# Patient Record
Sex: Female | Born: 1940 | Race: White | Hispanic: No | Smoking: Never smoker
Health system: Southern US, Community
[De-identification: ages and names within clinical notes are randomized; demographics above are authoritative.]

## PROBLEM LIST (undated history)

## (undated) DIAGNOSIS — C4490 Unspecified malignant neoplasm of skin, unspecified: Secondary | ICD-10-CM

## (undated) DIAGNOSIS — I5189 Other ill-defined heart diseases: Secondary | ICD-10-CM

## (undated) DIAGNOSIS — Z860101 Personal history of adenomatous and serrated colon polyps: Secondary | ICD-10-CM

## (undated) DIAGNOSIS — E785 Hyperlipidemia, unspecified: Secondary | ICD-10-CM

## (undated) DIAGNOSIS — R519 Headache, unspecified: Secondary | ICD-10-CM

## (undated) DIAGNOSIS — I480 Paroxysmal atrial fibrillation: Secondary | ICD-10-CM

## (undated) DIAGNOSIS — N189 Chronic kidney disease, unspecified: Secondary | ICD-10-CM

## (undated) DIAGNOSIS — C189 Malignant neoplasm of colon, unspecified: Secondary | ICD-10-CM

## (undated) DIAGNOSIS — Z86010 Personal history of colonic polyps: Secondary | ICD-10-CM

## (undated) DIAGNOSIS — F32A Depression, unspecified: Secondary | ICD-10-CM

## (undated) DIAGNOSIS — I4891 Unspecified atrial fibrillation: Secondary | ICD-10-CM

## (undated) DIAGNOSIS — I10 Essential (primary) hypertension: Secondary | ICD-10-CM

## (undated) DIAGNOSIS — R55 Syncope and collapse: Secondary | ICD-10-CM

## (undated) DIAGNOSIS — N182 Chronic kidney disease, stage 2 (mild): Secondary | ICD-10-CM

## (undated) DIAGNOSIS — T8859XA Other complications of anesthesia, initial encounter: Secondary | ICD-10-CM

## (undated) DIAGNOSIS — R1310 Dysphagia, unspecified: Secondary | ICD-10-CM

## (undated) DIAGNOSIS — R51 Headache: Secondary | ICD-10-CM

## (undated) DIAGNOSIS — M1990 Unspecified osteoarthritis, unspecified site: Secondary | ICD-10-CM

## (undated) HISTORY — PX: ESOPHAGOGASTRODUODENOSCOPY: SHX1529

## (undated) HISTORY — PX: DILATION AND CURETTAGE OF UTERUS: SHX78

## (undated) HISTORY — PX: ABDOMINAL HYSTERECTOMY: SHX81

## (undated) HISTORY — PX: TONSILLECTOMY: SUR1361

## (undated) HISTORY — PX: COLONOSCOPY: SHX174

## (undated) HISTORY — PX: TUBAL LIGATION: SHX77

## (undated) HISTORY — PX: ANKLE FRACTURE SURGERY: SHX122

## (undated) HISTORY — PX: OTHER SURGICAL HISTORY: SHX169

---

## 2005-02-28 ENCOUNTER — Ambulatory Visit: Payer: Self-pay | Admitting: Unknown Physician Specialty

## 2006-03-13 ENCOUNTER — Ambulatory Visit: Payer: Self-pay | Admitting: Unknown Physician Specialty

## 2007-02-27 ENCOUNTER — Ambulatory Visit: Payer: Self-pay | Admitting: Unknown Physician Specialty

## 2008-03-02 ENCOUNTER — Ambulatory Visit: Payer: Self-pay | Admitting: Unknown Physician Specialty

## 2008-04-20 ENCOUNTER — Ambulatory Visit: Payer: Self-pay | Admitting: Unknown Physician Specialty

## 2009-03-04 ENCOUNTER — Ambulatory Visit: Payer: Self-pay | Admitting: Unknown Physician Specialty

## 2010-03-09 ENCOUNTER — Ambulatory Visit: Payer: Self-pay | Admitting: Unknown Physician Specialty

## 2011-05-09 ENCOUNTER — Ambulatory Visit: Payer: Self-pay | Admitting: Unknown Physician Specialty

## 2011-08-15 ENCOUNTER — Emergency Department: Payer: Self-pay | Admitting: Emergency Medicine

## 2011-08-18 ENCOUNTER — Emergency Department: Payer: Self-pay | Admitting: Internal Medicine

## 2011-08-22 ENCOUNTER — Emergency Department: Payer: Self-pay | Admitting: Emergency Medicine

## 2011-08-29 ENCOUNTER — Emergency Department: Payer: Self-pay | Admitting: Emergency Medicine

## 2012-05-16 ENCOUNTER — Ambulatory Visit: Payer: Self-pay | Admitting: Unknown Physician Specialty

## 2012-05-17 LAB — PATHOLOGY REPORT

## 2012-08-07 ENCOUNTER — Ambulatory Visit: Payer: Self-pay | Admitting: Internal Medicine

## 2013-08-27 ENCOUNTER — Ambulatory Visit: Payer: Self-pay | Admitting: Internal Medicine

## 2014-08-28 ENCOUNTER — Ambulatory Visit: Payer: Self-pay | Admitting: Internal Medicine

## 2015-05-17 ENCOUNTER — Other Ambulatory Visit: Payer: Self-pay | Admitting: Internal Medicine

## 2015-05-17 DIAGNOSIS — Z1231 Encounter for screening mammogram for malignant neoplasm of breast: Secondary | ICD-10-CM

## 2015-09-29 ENCOUNTER — Other Ambulatory Visit: Payer: Self-pay | Admitting: Internal Medicine

## 2015-09-29 DIAGNOSIS — Z1231 Encounter for screening mammogram for malignant neoplasm of breast: Secondary | ICD-10-CM

## 2015-10-08 ENCOUNTER — Ambulatory Visit
Admission: RE | Admit: 2015-10-08 | Discharge: 2015-10-08 | Disposition: A | Discharge status: Home / Self Care | Payer: Medicare Other | Source: Ambulatory Visit | Attending: Internal Medicine | Admitting: Internal Medicine

## 2015-10-08 ENCOUNTER — Other Ambulatory Visit: Payer: Self-pay | Admitting: Internal Medicine

## 2015-10-08 DIAGNOSIS — Z1231 Encounter for screening mammogram for malignant neoplasm of breast: Secondary | ICD-10-CM | POA: Diagnosis present

## 2015-10-08 HISTORY — DX: Unspecified malignant neoplasm of skin, unspecified: C44.90

## 2015-10-08 HISTORY — DX: Malignant neoplasm of colon, unspecified: C18.9

## 2016-12-11 ENCOUNTER — Other Ambulatory Visit: Payer: Self-pay | Admitting: Internal Medicine

## 2016-12-11 DIAGNOSIS — Z1231 Encounter for screening mammogram for malignant neoplasm of breast: Secondary | ICD-10-CM

## 2017-01-10 ENCOUNTER — Ambulatory Visit
Admission: RE | Admit: 2017-01-10 | Discharge: 2017-01-10 | Disposition: A | Discharge status: Home / Self Care | Payer: Medicare HMO | Source: Ambulatory Visit | Attending: Internal Medicine | Admitting: Internal Medicine

## 2017-01-10 DIAGNOSIS — Z1231 Encounter for screening mammogram for malignant neoplasm of breast: Secondary | ICD-10-CM

## 2017-08-31 ENCOUNTER — Encounter: Payer: Self-pay | Admitting: *Deleted

## 2017-09-03 ENCOUNTER — Ambulatory Visit
Admission: RE | Admit: 2017-09-03 | Discharge: 2017-09-03 | Disposition: A | Discharge status: Home / Self Care | Payer: Medicare HMO | Source: Ambulatory Visit | Attending: Unknown Physician Specialty | Admitting: Unknown Physician Specialty

## 2017-09-03 ENCOUNTER — Encounter
Admission: RE | Disposition: A | Discharge status: Home / Self Care | Payer: Self-pay | Source: Ambulatory Visit | Attending: Unknown Physician Specialty

## 2017-09-03 ENCOUNTER — Ambulatory Visit: Payer: Medicare HMO | Admitting: Anesthesiology

## 2017-09-03 ENCOUNTER — Encounter: Payer: Self-pay | Admitting: *Deleted

## 2017-09-03 DIAGNOSIS — Z7901 Long term (current) use of anticoagulants: Secondary | ICD-10-CM | POA: Diagnosis not present

## 2017-09-03 DIAGNOSIS — D122 Benign neoplasm of ascending colon: Secondary | ICD-10-CM | POA: Diagnosis not present

## 2017-09-03 DIAGNOSIS — D123 Benign neoplasm of transverse colon: Secondary | ICD-10-CM | POA: Insufficient documentation

## 2017-09-03 DIAGNOSIS — Z1211 Encounter for screening for malignant neoplasm of colon: Secondary | ICD-10-CM | POA: Insufficient documentation

## 2017-09-03 DIAGNOSIS — Z86010 Personal history of colonic polyps: Secondary | ICD-10-CM | POA: Insufficient documentation

## 2017-09-03 DIAGNOSIS — K5730 Diverticulosis of large intestine without perforation or abscess without bleeding: Secondary | ICD-10-CM | POA: Insufficient documentation

## 2017-09-03 DIAGNOSIS — I480 Paroxysmal atrial fibrillation: Secondary | ICD-10-CM | POA: Insufficient documentation

## 2017-09-03 DIAGNOSIS — K640 First degree hemorrhoids: Secondary | ICD-10-CM | POA: Diagnosis not present

## 2017-09-03 DIAGNOSIS — Z79899 Other long term (current) drug therapy: Secondary | ICD-10-CM | POA: Diagnosis not present

## 2017-09-03 DIAGNOSIS — M479 Spondylosis, unspecified: Secondary | ICD-10-CM | POA: Insufficient documentation

## 2017-09-03 DIAGNOSIS — I10 Essential (primary) hypertension: Secondary | ICD-10-CM | POA: Insufficient documentation

## 2017-09-03 DIAGNOSIS — E785 Hyperlipidemia, unspecified: Secondary | ICD-10-CM | POA: Diagnosis not present

## 2017-09-03 DIAGNOSIS — Z85038 Personal history of other malignant neoplasm of large intestine: Secondary | ICD-10-CM | POA: Diagnosis not present

## 2017-09-03 HISTORY — DX: Personal history of colonic polyps: Z86.010

## 2017-09-03 HISTORY — PX: COLONOSCOPY WITH PROPOFOL: SHX5780

## 2017-09-03 HISTORY — DX: Hyperlipidemia, unspecified: E78.5

## 2017-09-03 HISTORY — DX: Paroxysmal atrial fibrillation: I48.0

## 2017-09-03 HISTORY — DX: Essential (primary) hypertension: I10

## 2017-09-03 HISTORY — DX: Headache: R51

## 2017-09-03 HISTORY — DX: Headache, unspecified: R51.9

## 2017-09-03 HISTORY — DX: Dysphagia, unspecified: R13.10

## 2017-09-03 HISTORY — DX: Personal history of adenomatous and serrated colon polyps: Z86.0101

## 2017-09-03 HISTORY — DX: Unspecified osteoarthritis, unspecified site: M19.90

## 2017-09-03 SURGERY — COLONOSCOPY WITH PROPOFOL
Anesthesia: General

## 2017-09-03 MED ORDER — SODIUM CHLORIDE 0.9 % IV SOLN
INTRAVENOUS | Status: DC
  Administered 2017-09-03: 1000 mL via INTRAVENOUS

## 2017-09-03 MED ORDER — PROPOFOL 500 MG/50ML IV EMUL
INTRAVENOUS | Status: AC
  Filled 2017-09-03: qty 50

## 2017-09-03 MED ORDER — PROPOFOL 500 MG/50ML IV EMUL
INTRAVENOUS | Status: DC | PRN
  Administered 2017-09-03: 90 ug/kg/min via INTRAVENOUS

## 2017-09-03 MED ORDER — SODIUM CHLORIDE 0.9 % IV SOLN: INTRAVENOUS | Status: DC

## 2017-09-03 MED ORDER — PROPOFOL 10 MG/ML IV BOLUS
INTRAVENOUS | Status: DC | PRN
  Administered 2017-09-03: 80 mg via INTRAVENOUS

## 2017-09-03 MED ORDER — METOPROLOL TARTRATE 5 MG/5ML IV SOLN
INTRAVENOUS | Status: AC
  Filled 2017-09-03: qty 5

## 2017-09-03 NOTE — Anesthesia Preprocedure Evaluation (Signed)
Anesthesia Evaluation  Patient identified by MRN, date of birth, ID band Patient awake    Reviewed: Allergy & Precautions, NPO status , Patient's Chart, lab work & pertinent test results  History of Anesthesia Complications Negative for: history of anesthetic complications  Airway Mallampati: II  TM Distance: >3 FB Neck ROM: Full    Dental  (+) Implants   Pulmonary neg pulmonary ROS, neg sleep apnea, neg COPD,    breath sounds clear to auscultation- rhonchi (-) wheezing      Cardiovascular hypertension, Pt. on medications (-) CAD, (-) Past MI and (-) Cardiac Stents + dysrhythmias (paroxysmal afib) Atrial Fibrillation  Rhythm:Regular Rate:Normal - Systolic murmurs and - Diastolic murmurs    Neuro/Psych  Headaches, negative psych ROS   GI/Hepatic negative GI ROS, Neg liver ROS,   Endo/Other  negative endocrine ROSneg diabetes  Renal/GU negative Renal ROS     Musculoskeletal  (+) Arthritis ,   Abdominal (+) - obese,   Peds  Hematology negative hematology ROS (+)   Anesthesia Other Findings Past Medical History: No date: Arthritis     Comment:  lumbar spine No date: Colon cancer (HCC) No date: Dysphagia No date: Headache     Comment:  migraine No date: Hx of adenomatous colonic polyps No date: Hyperlipidemia No date: Hypertension No date: Paroxysmal atrial fibrillation (HCC) No date: Skin cancer   Reproductive/Obstetrics                             Anesthesia Physical Anesthesia Plan  ASA: II  Anesthesia Plan: General   Post-op Pain Management:    Induction: Intravenous  PONV Risk Score and Plan: 2 and Propofol infusion  Airway Management Planned: Natural Airway  Additional Equipment:   Intra-op Plan:   Post-operative Plan:   Informed Consent: I have reviewed the patients History and Physical, chart, labs and discussed the procedure including the risks, benefits and  alternatives for the proposed anesthesia with the patient or authorized representative who has indicated his/her understanding and acceptance.   Dental advisory given  Plan Discussed with: CRNA and Anesthesiologist  Anesthesia Plan Comments:         Anesthesia Quick Evaluation

## 2017-09-03 NOTE — Op Note (Signed)
[facility] Gastroenterology Patient Name: Caroline Zamora Procedure Date: 09/03/2017 12:06 PM MRN: [id number] Account #: [id number] Date of Birth: 07/09/1941 Admit Type: Outpatient Age: 76 Room: [facility] ENDO ROOM 3 Gender: Female Note Status: Finalized Procedure:            Colonoscopy Indications:          High risk colon cancer surveillance: Personal history                        of colonic polyps Providers:            Manya Silvas, MD Referring MD:         Ocie Cornfield. Ouida Sills MD, MD (Referring MD) Medicines:            Propofol per Anesthesia Complications:        No immediate complications. Procedure:            Pre-Anesthesia Assessment:                       - After reviewing the risks and benefits, the patient                        was deemed in satisfactory condition to undergo the                        procedure.                       After obtaining informed consent, the colonoscope was                        passed under direct vision. Throughout the procedure,                        the patient's blood pressure, pulse, and oxygen                        saturations were monitored continuously. The                        Colonoscope was introduced through the anus and                        advanced to the the cecum, identified by appendiceal                        orifice and ileocecal valve. The colonoscopy was                        somewhat difficult due to a tortuous colon. Successful                        completion of the procedure was aided by applying                        abdominal pressure. The patient tolerated the procedure                        well. The quality of the bowel preparation was  excellent. Findings:      A diminutive polyp was found in the proximal ascending colon. The polyp       was sessile. The polyp was removed with a jumbo cold forceps. Resection       and retrieval were complete.      A  small polyp was found in the ascending colon. The polyp was sessile.       The polyp was removed with a jumbo cold forceps. Resection and retrieval       were complete.      A diminutive polyp was found in the hepatic flexure. The polyp was       sessile. The polyp was removed with a jumbo cold forceps. Resection and       retrieval were complete.      Many medium-mouthed diverticula were found in the sigmoid colon and       descending colon.      Internal hemorrhoids were found during endoscopy. The hemorrhoids were       small and Grade I (internal hemorrhoids that do not prolapse). Impression:           - One diminutive polyp in the proximal ascending colon,                        removed with a jumbo cold forceps. Resected and                        retrieved.                       - One small polyp in the ascending colon, removed with                        a jumbo cold forceps. Resected and retrieved.                       - One diminutive polyp at the hepatic flexure, removed                        with a jumbo cold forceps. Resected and retrieved.                       - Diverticulosis in the sigmoid colon and in the                        descending colon.                       - Internal hemorrhoids. Recommendation:       - Await pathology results. Manya Silvas, MD 09/03/2017 12:56:53 PM This report has been signed electronically. Number of Addenda: 0 Note Initiated On: 09/03/2017 12:06 PM Scope Withdrawal Time: 0 hours 17 minutes 36 seconds  Total Procedure Duration: 0 hours 39 minutes 3 seconds       [facility]

## 2017-09-03 NOTE — Anesthesia Post-op Follow-up Note (Signed)
Anesthesia QCDR form completed.        

## 2017-09-03 NOTE — Anesthesia Postprocedure Evaluation (Signed)
Anesthesia Post Note  Patient: Caroline Zamora  Procedure(s) Performed: COLONOSCOPY WITH PROPOFOL (N/A )  Patient location during evaluation: Endoscopy Anesthesia Type: General Level of consciousness: awake and alert and oriented Pain management: pain level controlled Vital Signs Assessment: post-procedure vital signs reviewed and stable Respiratory status: spontaneous breathing, nonlabored ventilation and respiratory function stable Cardiovascular status: blood pressure returned to baseline and stable Postop Assessment: no signs of nausea or vomiting Anesthetic complications: no     Last Vitals:  Vitals:   09/03/17 1305 09/03/17 1315  BP: (!) 142/80 (!) 158/87  Pulse: (!) 56 (!) 57  Resp: 20 14  Temp:    SpO2: 97% 97%    Last Pain:  Vitals:   09/03/17 1255  TempSrc: Tympanic                 Danicia Terhaar

## 2017-09-03 NOTE — Transfer of Care (Signed)
Immediate Anesthesia Transfer of Care Note  Patient: Caroline Zamora  Procedure(s) Performed: COLONOSCOPY WITH PROPOFOL (N/A )  Patient Location: PACU and Endoscopy Unit  Anesthesia Type:General  Level of Consciousness: drowsy and patient cooperative  Airway & Oxygen Therapy: Patient Spontanous Breathing and Patient connected to nasal cannula oxygen  Post-op Assessment: Report given to RN and Post -op Vital signs reviewed and stable  Post vital signs: Reviewed and stable  Last Vitals:  Vitals:   09/03/17 1254 09/03/17 1255  BP: 122/64 122/64  Pulse: (!) 58 (!) 59  Resp: 16 15  Temp:  (!) 35.8 C  SpO2: 97% 97%    Last Pain:  Vitals:   09/03/17 1255  TempSrc: Tympanic         Complications: No apparent anesthesia complications

## 2017-09-03 NOTE — H&P (Signed)
Primary Care Physician:  Kirk Ruths, MD Primary Gastroenterologist:  Dr. Vira Agar  Pre-Procedure History & Physical: HPI:  Caroline Zamora is a 76 y.o. female is here for an colonoscopy.   Past Medical History:  Diagnosis Date  . Arthritis    lumbar spine  . Colon cancer ([place])   . Dysphagia   . Headache    migraine  . Hx of adenomatous colonic polyps   . Hyperlipidemia   . Hypertension   . Paroxysmal atrial fibrillation (HCC)   . Skin cancer     Past Surgical History:  Procedure Laterality Date  . ABDOMINAL HYSTERECTOMY     with removal of tubes and ovaries  . COLONOSCOPY    . DILATION AND CURETTAGE OF UTERUS    . ESOPHAGOGASTRODUODENOSCOPY    . skin cancer removal    . TONSILLECTOMY    . TUBAL LIGATION      Prior to Admission medications   Medication Sig Start Date End Date Taking? Authorizing Provider  apixaban (ELIQUIS) 5 MG TABS tablet Take 5 mg by mouth 2 (two) times daily.   Yes [provider]  calcium carbonate (TUMS - DOSED IN MG ELEMENTAL CALCIUM) 500 MG chewable tablet Chew 1 tablet by mouth daily.   Yes [provider]  carvedilol (COREG) 12.5 MG tablet Take 12.5 mg by mouth 2 (two) times daily with a meal.   Yes [provider]  estradiol (ESTRACE) 0.5 MG tablet Take 0.5 mg by mouth daily.   Yes [provider]  mometasone (NASONEX) 50 MCG/ACT nasal spray Place 2 sprays into the nose daily.   Yes [provider]  Multiple Vitamin (MULTIVITAMIN) tablet Take 1 tablet by mouth daily.   Yes [provider]    Allergies as of 07/06/2017  . (Not on File)    Family History  Problem Relation Age of Onset  . Breast cancer Neg Hx     Social History   Social History  . Marital status: Widowed    Spouse name: N/A  . Number of children: N/A  . Years of education: N/A   Occupational History  . Not on file.   Social History Main Topics  . Smoking status: Never Smoker  . Smokeless  tobacco: Never Used  . Alcohol use Yes  . Drug use: No  . Sexual activity: Not on file   Other Topics Concern  . Not on file   Social History Narrative  . No narrative on file    Review of Systems: See HPI, otherwise negative ROS  Physical Exam: BP (!) 160/77   Pulse 68   Temp (!) 96.8 F (36 C) (Tympanic)   Resp 20   Ht 5\' 6"  (1.676 m)   Wt 79.4 kg (175 lb)   SpO2 99%   BMI 28.25 kg/m  General:   Alert,  pleasant and cooperative in NAD Head:  Normocephalic and atraumatic. Neck:  Supple; no masses or thyromegaly. Lungs:  Clear throughout to auscultation.    Heart:  Regular rate and rhythm. Abdomen:  Soft, nontender and nondistended. Normal bowel sounds, without guarding, and without rebound.   Neurologic:  Alert and  oriented x4;  grossly normal neurologically.  Impression/Plan: Caroline Zamora is here for an colonoscopy to be performed for [facility] dysplastic polyp.  Risks, benefits, limitations, and alternatives regarding  colonoscopy have been reviewed with the patient.  Questions have been answered.  All parties agreeable.   Gaylyn Cheers, MD  09/03/2017, 12:07  PM

## 2017-09-04 ENCOUNTER — Encounter: Payer: Self-pay | Admitting: Unknown Physician Specialty

## 2017-09-04 LAB — SURGICAL PATHOLOGY

## 2018-01-25 ENCOUNTER — Other Ambulatory Visit: Payer: Self-pay | Admitting: Internal Medicine

## 2018-01-25 DIAGNOSIS — Z1231 Encounter for screening mammogram for malignant neoplasm of breast: Secondary | ICD-10-CM

## 2018-02-20 ENCOUNTER — Ambulatory Visit
Admission: RE | Admit: 2018-02-20 | Discharge: 2018-02-20 | Disposition: A | Discharge status: Home / Self Care | Payer: Medicare HMO | Source: Ambulatory Visit | Attending: Internal Medicine | Admitting: Internal Medicine

## 2018-02-20 DIAGNOSIS — Z1231 Encounter for screening mammogram for malignant neoplasm of breast: Secondary | ICD-10-CM | POA: Diagnosis not present

## 2019-03-24 ENCOUNTER — Other Ambulatory Visit: Payer: Self-pay | Admitting: Internal Medicine

## 2019-03-24 DIAGNOSIS — Z1231 Encounter for screening mammogram for malignant neoplasm of breast: Secondary | ICD-10-CM

## 2019-05-07 ENCOUNTER — Ambulatory Visit
Admission: RE | Admit: 2019-05-07 | Discharge: 2019-05-07 | Disposition: A | Discharge status: Home / Self Care | Payer: Medicare HMO | Source: Ambulatory Visit | Attending: Internal Medicine | Admitting: Internal Medicine

## 2019-05-07 ENCOUNTER — Other Ambulatory Visit: Payer: Self-pay

## 2019-05-07 DIAGNOSIS — Z1231 Encounter for screening mammogram for malignant neoplasm of breast: Secondary | ICD-10-CM | POA: Insufficient documentation

## 2019-07-04 ENCOUNTER — Other Ambulatory Visit: Payer: Self-pay

## 2019-07-04 ENCOUNTER — Inpatient Hospital Stay
Admission: EM | Admit: 2019-07-04 | Discharge: 2019-07-07 | DRG: 309 | Disposition: A | Discharge status: Home / Self Care | Payer: Medicare HMO | Attending: Internal Medicine | Admitting: Internal Medicine

## 2019-07-04 ENCOUNTER — Emergency Department: Payer: Medicare HMO

## 2019-07-04 DIAGNOSIS — Z85828 Personal history of other malignant neoplasm of skin: Secondary | ICD-10-CM | POA: Diagnosis not present

## 2019-07-04 DIAGNOSIS — R55 Syncope and collapse: Secondary | ICD-10-CM | POA: Diagnosis not present

## 2019-07-04 DIAGNOSIS — Z90722 Acquired absence of ovaries, bilateral: Secondary | ICD-10-CM | POA: Diagnosis not present

## 2019-07-04 DIAGNOSIS — R197 Diarrhea, unspecified: Secondary | ICD-10-CM | POA: Diagnosis present

## 2019-07-04 DIAGNOSIS — E876 Hypokalemia: Secondary | ICD-10-CM | POA: Diagnosis present

## 2019-07-04 DIAGNOSIS — H532 Diplopia: Secondary | ICD-10-CM | POA: Diagnosis present

## 2019-07-04 DIAGNOSIS — T426X5A Adverse effect of other antiepileptic and sedative-hypnotic drugs, initial encounter: Secondary | ICD-10-CM | POA: Diagnosis present

## 2019-07-04 DIAGNOSIS — Z85038 Personal history of other malignant neoplasm of large intestine: Secondary | ICD-10-CM

## 2019-07-04 DIAGNOSIS — I700 Atherosclerosis of aorta: Secondary | ICD-10-CM | POA: Diagnosis present

## 2019-07-04 DIAGNOSIS — I739 Peripheral vascular disease, unspecified: Secondary | ICD-10-CM | POA: Diagnosis present

## 2019-07-04 DIAGNOSIS — R7989 Other specified abnormal findings of blood chemistry: Secondary | ICD-10-CM | POA: Diagnosis present

## 2019-07-04 DIAGNOSIS — Z882 Allergy status to sulfonamides status: Secondary | ICD-10-CM

## 2019-07-04 DIAGNOSIS — Z79899 Other long term (current) drug therapy: Secondary | ICD-10-CM

## 2019-07-04 DIAGNOSIS — Z20828 Contact with and (suspected) exposure to other viral communicable diseases: Secondary | ICD-10-CM | POA: Diagnosis present

## 2019-07-04 DIAGNOSIS — I4891 Unspecified atrial fibrillation: Secondary | ICD-10-CM | POA: Diagnosis present

## 2019-07-04 DIAGNOSIS — W19XXXA Unspecified fall, initial encounter: Secondary | ICD-10-CM | POA: Diagnosis present

## 2019-07-04 DIAGNOSIS — F419 Anxiety disorder, unspecified: Secondary | ICD-10-CM | POA: Diagnosis present

## 2019-07-04 DIAGNOSIS — E860 Dehydration: Secondary | ICD-10-CM | POA: Diagnosis present

## 2019-07-04 DIAGNOSIS — Z86010 Personal history of colonic polyps: Secondary | ICD-10-CM

## 2019-07-04 DIAGNOSIS — Y92000 Kitchen of unspecified non-institutional (private) residence as  the place of occurrence of the external cause: Secondary | ICD-10-CM

## 2019-07-04 DIAGNOSIS — I10 Essential (primary) hypertension: Secondary | ICD-10-CM | POA: Diagnosis present

## 2019-07-04 DIAGNOSIS — Z90710 Acquired absence of both cervix and uterus: Secondary | ICD-10-CM | POA: Diagnosis not present

## 2019-07-04 DIAGNOSIS — E871 Hypo-osmolality and hyponatremia: Secondary | ICD-10-CM | POA: Diagnosis present

## 2019-07-04 DIAGNOSIS — M25511 Pain in right shoulder: Secondary | ICD-10-CM | POA: Diagnosis present

## 2019-07-04 DIAGNOSIS — Z7901 Long term (current) use of anticoagulants: Secondary | ICD-10-CM

## 2019-07-04 DIAGNOSIS — I480 Paroxysmal atrial fibrillation: Secondary | ICD-10-CM | POA: Diagnosis not present

## 2019-07-04 DIAGNOSIS — E782 Mixed hyperlipidemia: Secondary | ICD-10-CM | POA: Diagnosis present

## 2019-07-04 DIAGNOSIS — Z888 Allergy status to other drugs, medicaments and biological substances status: Secondary | ICD-10-CM | POA: Diagnosis not present

## 2019-07-04 LAB — COMPREHENSIVE METABOLIC PANEL
ALT: 19 U/L (ref 0–44)
AST: 23 U/L (ref 15–41)
Albumin: 4 g/dL (ref 3.5–5.0)
Alkaline Phosphatase: 39 U/L (ref 38–126)
Anion gap: 13 (ref 5–15)
BUN: 14 mg/dL (ref 8–23)
CO2: 21 mmol/L — ABNORMAL LOW (ref 22–32)
Calcium: 8.6 mg/dL — ABNORMAL LOW (ref 8.9–10.3)
Chloride: 88 mmol/L — ABNORMAL LOW (ref 98–111)
Creatinine, Ser: 0.93 mg/dL (ref 0.44–1.00)
GFR calc Af Amer: >60 mL/min (ref >60)
GFR calc non Af Amer: >60 mL/min (ref >60)
Glucose, Bld: 189 mg/dL — ABNORMAL HIGH (ref 70–99)
Potassium: 2.5 mmol/L — CL (ref 3.5–5.1)
Sodium: 122 mmol/L — ABNORMAL LOW (ref 135–145)
Total Bilirubin: 1 mg/dL (ref 0.3–1.2)
Total Protein: 7.3 g/dL (ref 6.5–8.1)

## 2019-07-04 LAB — URINALYSIS, COMPLETE (UACMP) WITH MICROSCOPIC
Bilirubin Urine: NEGATIVE
Glucose, UA: NEGATIVE mg/dL
Ketones, ur: 20 mg/dL — AB
Nitrite: NEGATIVE
Protein, ur: 30 mg/dL — AB
Specific Gravity, Urine: 1.018 (ref 1.005–1.030)
pH: 6 (ref 5.0–8.0)

## 2019-07-04 LAB — CBC
HCT: 44.8 % (ref 36.0–46.0)
Hemoglobin: 16.2 g/dL — ABNORMAL HIGH (ref 12.0–15.0)
MCH: 29.6 pg (ref 26.0–34.0)
MCHC: 36.2 g/dL — ABNORMAL HIGH (ref 30.0–36.0)
MCV: 81.9 fL (ref 80.0–100.0)
Platelets: 275 10*3/uL (ref 150–400)
RBC: 5.47 MIL/uL — ABNORMAL HIGH (ref 3.87–5.11)
RDW: 13.2 % (ref 11.5–15.5)
WBC: 10.5 10*3/uL (ref 4.0–10.5)
nRBC: 0 % (ref 0.0–0.2)

## 2019-07-04 LAB — TROPONIN I (HIGH SENSITIVITY)
Troponin I (High Sensitivity): 6 ng/L (ref <18)
Troponin I (High Sensitivity): 7 ng/L (ref <18)

## 2019-07-04 LAB — SARS CORONAVIRUS 2 BY RT PCR (HOSPITAL ORDER, PERFORMED IN ~~LOC~~ HOSPITAL LAB): SARS Coronavirus 2: NEGATIVE

## 2019-07-04 LAB — TSH: TSH: 5.115 u[IU]/mL — ABNORMAL HIGH (ref 0.350–4.500)

## 2019-07-04 LAB — MAGNESIUM: Magnesium: 2 mg/dL (ref 1.7–2.4)

## 2019-07-04 MED ORDER — POTASSIUM CHLORIDE 10 MEQ/100ML IV SOLN
10.0000 meq | INTRAVENOUS | Status: AC
  Administered 2019-07-04 (×2): 10 meq via INTRAVENOUS
  Filled 2019-07-04 (×2): qty 100

## 2019-07-04 MED ORDER — ONDANSETRON HCL 4 MG/2ML IJ SOLN: 4.0000 mg | Freq: Four times a day (QID) | INTRAMUSCULAR | Status: DC | PRN

## 2019-07-04 MED ORDER — POTASSIUM CHLORIDE IN NACL 40-0.9 MEQ/L-% IV SOLN
INTRAVENOUS | Status: DC
  Filled 2019-07-04: qty 1000

## 2019-07-04 MED ORDER — SODIUM CHLORIDE 0.9 % IV BOLUS
500.0000 mL | Freq: Once | INTRAVENOUS | Status: AC
  Administered 2019-07-04: 500 mL via INTRAVENOUS

## 2019-07-04 MED ORDER — SODIUM CHLORIDE 0.9% FLUSH
3.0000 mL | Freq: Two times a day (BID) | INTRAVENOUS | Status: DC
  Administered 2019-07-05 – 2019-07-06 (×4): 3 mL via INTRAVENOUS

## 2019-07-04 MED ORDER — GABAPENTIN 100 MG PO CAPS: 100.0000 mg | ORAL_CAPSULE | Freq: Three times a day (TID) | ORAL | Status: DC

## 2019-07-04 MED ORDER — LORAZEPAM 1 MG PO TABS
1.0000 mg | ORAL_TABLET | Freq: Four times a day (QID) | ORAL | Status: DC | PRN
  Administered 2019-07-05 – 2019-07-07 (×3): 1 mg via ORAL
  Filled 2019-07-04 (×3): qty 1

## 2019-07-04 MED ORDER — ACETAMINOPHEN 650 MG RE SUPP: 650.0000 mg | Freq: Four times a day (QID) | RECTAL | Status: DC | PRN

## 2019-07-04 MED ORDER — DILTIAZEM HCL 100 MG IV SOLR
5.0000 mg/h | INTRAVENOUS | Status: DC
  Administered 2019-07-04: 5 mg/h via INTRAVENOUS
  Filled 2019-07-04 (×2): qty 100

## 2019-07-04 MED ORDER — ONDANSETRON HCL 4 MG PO TABS: 4.0000 mg | ORAL_TABLET | Freq: Four times a day (QID) | ORAL | Status: DC | PRN

## 2019-07-04 MED ORDER — CARVEDILOL 12.5 MG PO TABS
12.5000 mg | ORAL_TABLET | Freq: Two times a day (BID) | ORAL | Status: DC
  Administered 2019-07-05 – 2019-07-07 (×5): 12.5 mg via ORAL
  Filled 2019-07-04 (×5): qty 1

## 2019-07-04 MED ORDER — LORAZEPAM 0.5 MG PO TABS
0.2500 mg | ORAL_TABLET | Freq: Once | ORAL | Status: AC
  Administered 2019-07-04: 0.25 mg via ORAL
  Filled 2019-07-04: qty 1

## 2019-07-04 MED ORDER — ACETAMINOPHEN 325 MG PO TABS: 650.0000 mg | ORAL_TABLET | Freq: Four times a day (QID) | ORAL | Status: DC | PRN

## 2019-07-04 MED ORDER — SODIUM CHLORIDE 0.9 % IV SOLN: 250.0000 mL | INTRAVENOUS | Status: DC | PRN

## 2019-07-04 MED ORDER — FLUTICASONE PROPIONATE 50 MCG/ACT NA SUSP
1.0000 | Freq: Every day | NASAL | Status: DC | PRN
  Filled 2019-07-04: qty 16

## 2019-07-04 MED ORDER — APIXABAN 5 MG PO TABS
5.0000 mg | ORAL_TABLET | Freq: Two times a day (BID) | ORAL | Status: DC
  Administered 2019-07-04 – 2019-07-07 (×6): 5 mg via ORAL
  Filled 2019-07-04 (×6): qty 1

## 2019-07-04 MED ORDER — POTASSIUM CHLORIDE 20 MEQ/15ML (10%) PO SOLN
40.0000 meq | Freq: Once | ORAL | Status: AC
  Administered 2019-07-04: 40 meq via ORAL
  Filled 2019-07-04: qty 30

## 2019-07-04 MED ORDER — ESTRADIOL 1 MG PO TABS
0.5000 mg | ORAL_TABLET | Freq: Every day | ORAL | Status: DC
  Administered 2019-07-05 – 2019-07-07 (×3): 0.5 mg via ORAL
  Filled 2019-07-04 (×3): qty 0.5

## 2019-07-04 MED ORDER — POTASSIUM CHLORIDE 10 MEQ/100ML IV SOLN
10.0000 meq | INTRAVENOUS | Status: AC
  Administered 2019-07-04: 10 meq via INTRAVENOUS
  Filled 2019-07-04 (×4): qty 100

## 2019-07-04 MED ORDER — ESCITALOPRAM OXALATE 10 MG PO TABS
10.0000 mg | ORAL_TABLET | Freq: Every day | ORAL | Status: DC
  Administered 2019-07-05 – 2019-07-07 (×3): 10 mg via ORAL
  Filled 2019-07-04 (×3): qty 1

## 2019-07-04 MED ORDER — SODIUM CHLORIDE 0.9% FLUSH
3.0000 mL | INTRAVENOUS | Status: DC | PRN
  Administered 2019-07-05: 21:00:00 3 mL via INTRAVENOUS
  Filled 2019-07-04: qty 3

## 2019-07-04 NOTE — ED Provider Notes (Signed)
Patient care assumed from Dr. Rip Harbour.  Briefly, 78 year old female here with A. fib RVR and multiple syncopal episodes.  Heart rate is improved when not moving.  Lab work shows significant hypokalemia.  She does appear dehydrated clinically as well with hemoconcentration.  Gentle fluids, potassium replaced both p.o. and IV.  Magnesium added on also ordered.  Will admit to the hospitalist team.   Duffy Bruce, MD 07/04/19 1734

## 2019-07-04 NOTE — ED Provider Notes (Signed)
[facility] Emergency Department Provider Note   ____________________________________________   First MD Initiated Contact with Patient 07/04/19 1417     (approximate)  I have reviewed the triage vital signs and the nursing notes.   HISTORY  Chief Complaint Near Syncope   HPI Caroline Zamora is a 78 y.o. female who started on gabapentin for anxiety 3 days ago since then she has been feeling worse has been feeling lightheaded.  Today she was very wobbly on her feet and then fell backwards twice hitting her head.  She takes Eliquis.  She does not have any headache or any other plain pain except for some mild pain in the right shoulder that has just started.  She is very anxious and asked for something for anxiety she cannot stand the gabapentin any longer.         Past Medical History:  Diagnosis Date  . Arthritis    lumbar spine  . Colon cancer ([place])   . Dysphagia   . Headache    migraine  . Hx of adenomatous colonic polyps   . Hyperlipidemia   . Hypertension   . Paroxysmal atrial fibrillation (HCC)   . Skin cancer     There are no active problems to display for this patient.   Past Surgical History:  Procedure Laterality Date  . ABDOMINAL HYSTERECTOMY     with removal of tubes and ovaries  . COLONOSCOPY    . COLONOSCOPY WITH PROPOFOL N/A 09/03/2017   Procedure: COLONOSCOPY WITH PROPOFOL;  Surgeon: Manya Silvas, MD;  [facility] ENDOSCOPY;  Service: Endoscopy;  Laterality: N/A;  . DILATION AND CURETTAGE OF UTERUS    . ESOPHAGOGASTRODUODENOSCOPY    . skin cancer removal    . TONSILLECTOMY    . TUBAL LIGATION      Prior to Admission medications   Medication Sig Start Date End Date Taking? Authorizing Provider  apixaban (ELIQUIS) 5 MG TABS tablet Take 5 mg by mouth 2 (two) times daily.    [provider]  calcium carbonate (TUMS - DOSED IN MG ELEMENTAL CALCIUM) 500 MG chewable tablet Chew 1 tablet by mouth daily.     [provider]  carvedilol (COREG) 12.5 MG tablet Take 12.5 mg by mouth 2 (two) times daily with a meal.    [provider]  estradiol (ESTRACE) 0.5 MG tablet Take 0.5 mg by mouth daily.    [provider]  mometasone (NASONEX) 50 MCG/ACT nasal spray Place 2 sprays into the nose daily.    [provider]  Multiple Vitamin (MULTIVITAMIN) tablet Take 1 tablet by mouth daily.    [provider]    Allergies Desyrel [trazodone] and Sulfa antibiotics  Family History  Problem Relation Age of Onset  . Breast cancer Neg Hx     Social History Social History   Tobacco Use  . Smoking status: Never Smoker  . Smokeless tobacco: Never Used  Substance Use Topics  . Alcohol use: Yes  . Drug use: No    Review of Systems  Constitutional: No fever/chills Eyes: No visual changes. ENT: No sore throat. Cardiovascular: Denies chest pain. Respiratory: Denies shortness of breath. Gastrointestinal: No abdominal pain.  No nausea, no vomiting.  No diarrhea.  No constipation. Genitourinary: Negative for dysuria. Musculoskeletal: Negative for back pain. Skin: Negative for rash. Neurological: Negative for headaches, focal weakness   ____________________________________________   PHYSICAL EXAM:  VITAL SIGNS: ED Triage Vitals  Enc Vitals Group  BP 07/04/19 1326 115/65     Pulse Rate 07/04/19 1326 (!) 115     Resp 07/04/19 1326 19     Temp 07/04/19 1326 98 F (36.7 C)     Temp Source 07/04/19 1326 Oral     SpO2 07/04/19 1326 97 %     Weight 07/04/19 1329 175 lb (79.4 kg)     Height 07/04/19 1329 5\' 6"  (1.676 m)     Head Circumference --      Peak Flow --      Pain Score 07/04/19 1329 0     Pain Loc --      Pain Edu? --      Excl. in [place]? --     Constitutional: Alert and oriented. Well appearing and in no acute distress. Eyes: Conjunctivae are normal. PERRL. EOMI. Head: Atraumatic. Nose: No congestion/rhinnorhea. Mouth/Throat: Mucous  membranes are moist.  Oropharynx non-erythematous. Neck: No stridor.  Cardiovascular: Rapid rate, irregular rhythm. Grossly normal heart sounds.  Good peripheral circulation. Respiratory: Normal respiratory effort.  No retractions. Lungs CTAB. Gastrointestinal: Soft and nontender. No distention. No abdominal bruits. No CVA tenderness. Musculoskeletal: No lower extremity tenderness nor edema. . Neurologic:  Normal speech and language. No gross focal neurologic deficits are appreciated. Skin:  Skin is warm, dry and intact. No rash noted.   ____________________________________________   LABS (all labs ordered are listed, but only abnormal results are displayed)  Labs Reviewed  CBC - Abnormal; Notable for the following components:      Result Value   RBC 5.47 (*)    Hemoglobin 16.2 (*)    MCHC 36.2 (*)    All other components within normal limits  COMPREHENSIVE METABOLIC PANEL - Abnormal; Notable for the following components:   Sodium 122 (*)    Potassium 2.5 (*)    Chloride 88 (*)    CO2 21 (*)    Glucose, Bld 189 (*)    Calcium 8.6 (*)    All other components within normal limits  URINALYSIS, COMPLETE (UACMP) WITH MICROSCOPIC  MAGNESIUM  CBG MONITORING, ED  TROPONIN I (HIGH SENSITIVITY)  TROPONIN I (HIGH SENSITIVITY)   ____________________________________________  EKG  No old EKGs are available.  This EKG read interpreted by me shows A. fib at a rate of 105 normal axis very irregular baseline suggestion of T wave irregularities diffusely. ____________________________________________  RADIOLOGY  ED MD interpretation: Chest x-ray read by radiology reviewed by me shows no acute disease Patient's head and neck CT read by radiology reviewed briefly by me show no acute disease Official radiology report(s): Ct Head Wo Contrast  Result Date: 07/04/2019 CLINICAL DATA:  Near syncopal episodes today, head trauma. EXAM: CT HEAD WITHOUT CONTRAST CT CERVICAL SPINE WITHOUT CONTRAST  TECHNIQUE: Multidetector CT imaging of the head and cervical spine was performed following the standard protocol without intravenous contrast. Multiplanar CT image reconstructions of the cervical spine were also generated. COMPARISON:  Report from MRI dated 01/05/1997 FINDINGS: CT HEAD FINDINGS Brain: The brainstem, cerebellum, cerebral peduncles, thalami, basal ganglia, basilar cisterns, and ventricular system appear within normal limits. No intracranial hemorrhage, mass lesion, or acute CVA. Vascular: There is atherosclerotic calcification of the cavernous carotid arteries bilaterally. Skull: Unremarkable Sinuses/Orbits: Incidental hypoplastic right sphenoid sinus. Otherwise unremarkable where included. Other: No supplemental non-categorized findings. CT CERVICAL SPINE FINDINGS Alignment: 2 mm degenerative anterolisthesis at C3-4. Skull base and vertebrae: No cervical spine fracture or acute bony finding is identified. Potential Schmorl's node along the superior endplate of T2. Soft  tissues and spinal canal: Unremarkable Disc levels: Uncinate and facet spurring cause osseous foraminal stenosis on the left at C3-4, C4-5, C6-7, and C7-T1; and on the right at C3-4, C5-6, and C6-7. Upper chest: Mild biapical pleuroparenchymal scarring Other: No supplemental non-categorized findings. IMPRESSION: 1. No acute intracranial findings and no acute cervical spine findings. 2. Multilevel cervical impingement due to spurring. 3. Atherosclerosis. 4. 2 mm of degenerative anterolisthesis at C3-4. No acute subluxation is identified. Electronically Signed   By: Van Clines M.D.   On: 07/04/2019 15:07   Ct Cervical Spine Wo Contrast  Result Date: 07/04/2019 CLINICAL DATA:  Near syncopal episodes today, head trauma. EXAM: CT HEAD WITHOUT CONTRAST CT CERVICAL SPINE WITHOUT CONTRAST TECHNIQUE: Multidetector CT imaging of the head and cervical spine was performed following the standard protocol without intravenous contrast.  Multiplanar CT image reconstructions of the cervical spine were also generated. COMPARISON:  Report from MRI dated 01/05/1997 FINDINGS: CT HEAD FINDINGS Brain: The brainstem, cerebellum, cerebral peduncles, thalami, basal ganglia, basilar cisterns, and ventricular system appear within normal limits. No intracranial hemorrhage, mass lesion, or acute CVA. Vascular: There is atherosclerotic calcification of the cavernous carotid arteries bilaterally. Skull: Unremarkable Sinuses/Orbits: Incidental hypoplastic right sphenoid sinus. Otherwise unremarkable where included. Other: No supplemental non-categorized findings. CT CERVICAL SPINE FINDINGS Alignment: 2 mm degenerative anterolisthesis at C3-4. Skull base and vertebrae: No cervical spine fracture or acute bony finding is identified. Potential Schmorl's node along the superior endplate of T2. Soft tissues and spinal canal: Unremarkable Disc levels: Uncinate and facet spurring cause osseous foraminal stenosis on the left at C3-4, C4-5, C6-7, and C7-T1; and on the right at C3-4, C5-6, and C6-7. Upper chest: Mild biapical pleuroparenchymal scarring Other: No supplemental non-categorized findings. IMPRESSION: 1. No acute intracranial findings and no acute cervical spine findings. 2. Multilevel cervical impingement due to spurring. 3. Atherosclerosis. 4. 2 mm of degenerative anterolisthesis at C3-4. No acute subluxation is identified. Electronically Signed   By: Van Clines M.D.   On: 07/04/2019 15:07   Dg Chest Portable 1 View  Result Date: 07/04/2019 CLINICAL DATA:  Near syncope EXAM: PORTABLE CHEST 1 VIEW COMPARISON:  None. Patient's prior chest x-ray is not available on PACs for comparison. FINDINGS: The heart size and mediastinal contours are within normal limits. Both lungs are clear. The visualized skeletal structures are unremarkable. IMPRESSION: No active disease. Electronically Signed   By: Abelardo Diesel M.D.   On: 07/04/2019 14:31     ____________________________________________   PROCEDURES  Procedure(s) performed (including Critical Care):  Procedures   ____________________________________________   INITIAL IMPRESSION / ASSESSMENT AND PLAN / ED COURSE     Patient with 2 episodes of what sounds like syncope.  She was standing get woozy does not remember falling but did remember hitting the ground both times.  Blood pressure was lower than usual when she got to the emergency room.  This may just be a medication reaction but I think in view of her age and being on Eliquis etc. she should be watched in the hospital overnight.          ____________________________________________   FINAL CLINICAL IMPRESSION(S) / ED DIAGNOSES  Final diagnoses:  Syncope and collapse     ED Discharge Orders    None       Note:  This document was prepared using Dragon voice recognition software and may include unintentional dictation errors.    Nena Polio, MD 07/04/19 [phone]

## 2019-07-04 NOTE — ED Triage Notes (Signed)
Pt stated she experienced two near syncopal episodes 45 minutes to an hour apart. Pt stated she called ems following 2nd episode. Pt started on Gabpentin 3 days ago.

## 2019-07-04 NOTE — Plan of Care (Signed)
  Problem: Education: Goal: Knowledge of General Education information will improve Description: Including pain rating scale, medication(s)/side effects and non-pharmacologic comfort measures Outcome: Progressing Note: Patient admitted at 2200. No complaints of pain. Patient profile completed. Heart rate is improving. Patient remains on the cardizem drip.

## 2019-07-04 NOTE — H&P (Signed)
[facility] [place] NAME: Caroline Zamora    MR#:  [id number]  DATE OF BIRTH:  1941-04-14  DATE OF ADMISSION:  07/04/2019  PRIMARY CARE PHYSICIAN: Kirk Ruths, MD   REQUESTING/REFERRING PHYSICIAN: Duffy Bruce, MD  CHIEF COMPLAINT:   Chief Complaint  Patient presents with  . Near Syncope    HISTORY OF PRESENT ILLNESS: Caroline Zamora  is a 78 y.o. female with a known history of atrial fibrillation, history of colon cancer, hypertension, hyperlipidemia, paroxysmal atrial fibrillation who presents to the hospital today with near syncope.  Patient states that over the past few weeks she has been very anxious and has not felt as well.  She also recently has had nausea vomiting and diarrhea.  No fevers or chills.  Earlier today she was very unsteady on her feet.  And also had episode where she is going to pass out.  Patient comes to the ER and is noted to have severe hypokalemia also noticed to have A. fib with RVR.  PAST MEDICAL HISTORY:   Past Medical History:  Diagnosis Date  . Arthritis    lumbar spine  . Colon cancer ([place])   . Dysphagia   . Headache    migraine  . Hx of adenomatous colonic polyps   . Hyperlipidemia   . Hypertension   . Paroxysmal atrial fibrillation (H[facility])   . Skin cancer     PAST SURGICAL HISTORY:  Past Surgical History:  Procedure Laterality Date  . ABDOMINAL HYSTERECTOMY     with removal of tubes and ovaries  . COLONOSCOPY    . COLONOSCOPY WITH PROPOFOL N/A 09/03/2017   Procedure: COLONOSCOPY WITH PROPOFOL;  Surgeon: Manya Silvas, MD;  Location: [facility] ENDOSCOPY;  Service: Endoscopy;  Laterality: N/A;  . DILATION AND CURETTAGE OF UTERUS    . ESOPHAGOGASTRODUODENOSCOPY    . skin cancer removal    . TONSILLECTOMY    . TUBAL LIGATION      SOCIAL HISTORY:  Social History   Tobacco Use  . Smoking status: Never Smoker  . Smokeless tobacco: Never Used  Substance Use Topics  . Alcohol use: Yes     FAMILY HISTORY:  Family History  Problem Relation Age of Onset  . Breast cancer Neg Hx     DRUG ALLERGIES:  Allergies  Allergen Reactions  . Desyrel [Trazodone] Rash  . Sulfa Antibiotics Rash    REVIEW OF SYSTEMS:   CONSTITUTIONAL: No fever, positive fatigue or positive weakness.  EYES: No blurred or double vision.  EARS, NOSE, AND THROAT: No tinnitus or ear pain.  RESPIRATORY: No cough, shortness of breath, wheezing or hemoptysis.  CARDIOVASCULAR: No chest pain, orthopnea, edema.  GASTROINTESTINAL: No nausea, vomiting, positive diarrhea or abdominal pain.  GENITOURINARY: No dysuria, hematuria.  ENDOCRINE: No polyuria, nocturia,  HEMATOLOGY: No anemia, easy bruising or bleeding SKIN: No rash or lesion. MUSCULOSKELETAL: No joint pain or arthritis.   NEUROLOGIC: No tingling, numbness, weakness.  PSYCHIATRY: No anxiety or depression.   MEDICATIONS AT HOME:  Prior to Admission medications   Medication Sig Start Date End Date Taking? Authorizing Provider  apixaban (ELIQUIS) 5 MG TABS tablet Take 5 mg by mouth 2 (two) times daily.   Yes [provider]  carvedilol (COREG) 12.5 MG tablet Take 12.5 mg by mouth 2 (two) times daily with a meal.   Yes [provider]  escitalopram (LEXAPRO) 10 MG tablet Take 10 mg by mouth daily. 07/01/19  Yes [provider]  estradiol (ESTRACE) 0.5 MG tablet Take 0.5 mg by mouth daily.   Yes [provider]  gabapentin (NEURONTIN) 100 MG capsule Take 100 mg by mouth 3 (three) times daily as needed. 07/02/19  Yes [provider]  losartan-hydrochlorothiazide (HYZAAR) 100-12.5 MG tablet Take 1 tablet by mouth daily. 04/14/19  Yes [provider]  mometasone (NASONEX) 50 MCG/ACT nasal spray Place 2 sprays into the nose daily as needed.    Yes [provider]      PHYSICAL EXAMINATION:   VITAL SIGNS: Blood pressure (!) 140/110, pulse 61, temperature 98 F (36.7 C), temperature source  Oral, resp. rate (!) 26, height 5\' 6"  (1.676 m), weight 79.4 kg, SpO2 98 %.  GENERAL:  78 y.o.-year-old patient lying in the bed with no acute distress.  EYES: Pupils equal, round, reactive to light and accommodation. No scleral icterus. Extraocular muscles intact.  HEENT: Head atraumatic, normocephalic. Oropharynx and nasopharynx clear.  NECK:  Supple, no jugular venous distention. No thyroid enlargement, no tenderness.  LUNGS: Normal breath sounds bilaterally, no wheezing, rales,rhonchi or crepitation. No use of accessory muscles of respiration.  CARDIOVASCULAR: Irregularly irregular no murmurs clicks or gallops.  ABDOMEN: Soft, nontender, nondistended. Bowel sounds present. No organomegaly or mass.  EXTREMITIES: No pedal edema, cyanosis, or clubbing.  NEUROLOGIC: Cranial nerves II through XII are intact. Muscle strength 5/5 in all extremities. Sensation intact. Gait not checked.  PSYCHIATRIC: The patient is alert and oriented x 3.  SKIN: No obvious rash, lesion, or ulcer.   LABORATORY PANEL:   CBC Recent Labs  Lab 07/04/19 1343  WBC 10.5  HGB 16.2*  HCT 44.8  PLT 275  MCV 81.9  MCH 29.6  MCHC 36.2*  RDW 13.2   ------------------------------------------------------------------------------------------------------------------  Chemistries  Recent Labs  Lab 07/04/19 1343  NA 122*  K 2.5*  CL 88*  CO2 21*  GLUCOSE 189*  BUN 14  CREATININE 0.93  CALCIUM 8.6*  AST 23  ALT 19  ALKPHOS 39  BILITOT 1.0   ------------------------------------------------------------------------------------------------------------------ estimated creatinine clearance is 55.5 mL/min (by C-G formula based on SCr of 0.93 mg/dL). ------------------------------------------------------------------------------------------------------------------ No results for input(s): TSH, T4TOTAL, T3FREE, THYROIDAB in the last 72 hours.  Invalid input(s): FREET3   Coagulation profile No results for  input(s): INR, PROTIME in the last 168 hours. ------------------------------------------------------------------------------------------------------------------- No results for input(s): DDIMER in the last 72 hours. -------------------------------------------------------------------------------------------------------------------  Cardiac Enzymes No results for input(s): CKMB, TROPONINI, MYOGLOBIN in the last 168 hours.  Invalid input(s): CK ------------------------------------------------------------------------------------------------------------------ Invalid input(s): POCBNP  ---------------------------------------------------------------------------------------------------------------  Urinalysis    Component Value Date/Time   COLORURINE YELLOW (A) 07/04/2019 1343   APPEARANCEUR HAZY (A) 07/04/2019 1343   LABSPEC 1.018 07/04/2019 1343   PHURINE 6.0 07/04/2019 1343   GLUCOSEU NEGATIVE 07/04/2019 1343   HGBUR MODERATE (A) 07/04/2019 1343   BILIRUBINUR NEGATIVE 07/04/2019 1343   KETONESUR 20 (A) 07/04/2019 1343   PROTEINUR 30 (A) 07/04/2019 1343   NITRITE NEGATIVE 07/04/2019 1343   LEUKOCYTESUR TRACE (A) 07/04/2019 1343     RADIOLOGY: Ct Head Wo Contrast  Result Date: 07/04/2019 CLINICAL DATA:  Near syncopal episodes today, head trauma. EXAM: CT HEAD WITHOUT CONTRAST CT CERVICAL SPINE WITHOUT CONTRAST TECHNIQUE: Multidetector CT imaging of the head and cervical spine was performed following the standard protocol without intravenous contrast. Multiplanar CT image reconstructions of the cervical spine were also generated. COMPARISON:  Report from MRI dated 01/05/1997 FINDINGS: CT HEAD FINDINGS Brain: The brainstem, cerebellum, cerebral peduncles, thalami, basal ganglia, basilar cisterns, and  ventricular system appear within normal limits. No intracranial hemorrhage, mass lesion, or acute CVA. Vascular: There is atherosclerotic calcification of the cavernous carotid arteries  bilaterally. Skull: Unremarkable Sinuses/Orbits: Incidental hypoplastic right sphenoid sinus. Otherwise unremarkable where included. Other: No supplemental non-categorized findings. CT CERVICAL SPINE FINDINGS Alignment: 2 mm degenerative anterolisthesis at C3-4. Skull base and vertebrae: No cervical spine fracture or acute bony finding is identified. Potential Schmorl's node along the superior endplate of T2. Soft tissues and spinal canal: Unremarkable Disc levels: Uncinate and facet spurring cause osseous foraminal stenosis on the left at C3-4, C4-5, C6-7, and C7-T1; and on the right at C3-4, C5-6, and C6-7. Upper chest: Mild biapical pleuroparenchymal scarring Other: No supplemental non-categorized findings. IMPRESSION: 1. No acute intracranial findings and no acute cervical spine findings. 2. Multilevel cervical impingement due to spurring. 3. Atherosclerosis. 4. 2 mm of degenerative anterolisthesis at C3-4. No acute subluxation is identified. Electronically Signed   By: Van Clines M.D.   On: 07/04/2019 15:07   Ct Cervical Spine Wo Contrast  Result Date: 07/04/2019 CLINICAL DATA:  Near syncopal episodes today, head trauma. EXAM: CT HEAD WITHOUT CONTRAST CT CERVICAL SPINE WITHOUT CONTRAST TECHNIQUE: Multidetector CT imaging of the head and cervical spine was performed following the standard protocol without intravenous contrast. Multiplanar CT image reconstructions of the cervical spine were also generated. COMPARISON:  Report from MRI dated 01/05/1997 FINDINGS: CT HEAD FINDINGS Brain: The brainstem, cerebellum, cerebral peduncles, thalami, basal ganglia, basilar cisterns, and ventricular system appear within normal limits. No intracranial hemorrhage, mass lesion, or acute CVA. Vascular: There is atherosclerotic calcification of the cavernous carotid arteries bilaterally. Skull: Unremarkable Sinuses/Orbits: Incidental hypoplastic right sphenoid sinus. Otherwise unremarkable where included. Other: No  supplemental non-categorized findings. CT CERVICAL SPINE FINDINGS Alignment: 2 mm degenerative anterolisthesis at C3-4. Skull base and vertebrae: No cervical spine fracture or acute bony finding is identified. Potential Schmorl's node along the superior endplate of T2. Soft tissues and spinal canal: Unremarkable Disc levels: Uncinate and facet spurring cause osseous foraminal stenosis on the left at C3-4, C4-5, C6-7, and C7-T1; and on the right at C3-4, C5-6, and C6-7. Upper chest: Mild biapical pleuroparenchymal scarring Other: No supplemental non-categorized findings. IMPRESSION: 1. No acute intracranial findings and no acute cervical spine findings. 2. Multilevel cervical impingement due to spurring. 3. Atherosclerosis. 4. 2 mm of degenerative anterolisthesis at C3-4. No acute subluxation is identified. Electronically Signed   By: Van Clines M.D.   On: 07/04/2019 15:07   Dg Chest Portable 1 View  Result Date: 07/04/2019 CLINICAL DATA:  Near syncope EXAM: PORTABLE CHEST 1 VIEW COMPARISON:  None. Patient's prior chest x-ray is not available on PACs for comparison. FINDINGS: The heart size and mediastinal contours are within normal limits. Both lungs are clear. The visualized skeletal structures are unremarkable. IMPRESSION: No active disease. Electronically Signed   By: Abelardo Diesel M.D.   On: 07/04/2019 14:31    EKG: Orders placed or performed during the hospital encounter of 07/04/19  . EKG 12-Lead  . EKG 12-Lead  . ED EKG  . ED EKG    IMPRESSION AND PLAN: Patient 78 year old with history of paroxysmal atrial fibrillation presenting with syncope  1.  A. fib with RVR we will start patient on IV Cardizem drip.  Continue her Coreg and Eliquis I will check echocardiogram of the heart as well as TSH We will replace her potassium  2.  Diarrhea currently better but if persists will need further stool study  3 severe hypokalemia monitor  on telemetry potassium is being replaced  4.   Anxiety will place her on Ativan as needed continue Lexapro  5.  Essential hypertension continue therapy with Coreg I will hold losartan HCTZ due to patient being on Cardizem drip  6.  Miscellaneous patient on Eliquis already  All the records are reviewed and case discussed with ED provider. Management plans discussed with the patient, family and they are in agreement.  CODE STATUS: Full code    TOTAL TIME TAKING CARE OF THIS PATIENT: 55 minutes.    Dustin Flock M.D on 07/04/2019 at 6:06 PM  Between 7am to 6pm - Pager - [phone]  After 6pm go to [email or web address] - password [email or web address]  [employer] Office  [phone]  CC: Primary care physician; Kirk Ruths, MD

## 2019-07-04 NOTE — ED Notes (Signed)
ED TO INPATIENT HANDOFF REPORT  ED Nurse Name and Phone #: Kambrea Carrasco 63  S Name/Age/Gender Caroline Zamora 78 y.o. female Room/Bed: ED03A/ED03A  Code Status   Code Status: Not on file  Home/SNF/Other Home Patient oriented to: self, place, time and situation Is this baseline? Yes   Triage Complete: Triage complete  Chief Complaint Fall   Triage Note Pt stated she experienced two near syncopal episodes 45 minutes to an hour apart. Pt stated she called ems following 2nd episode. Pt started on Gabpentin 3 days ago.    Allergies Allergies  Allergen Reactions  . Desyrel [Trazodone] Rash  . Sulfa Antibiotics Rash    Level of Care/Admitting Diagnosis ED Disposition    ED Disposition Condition [facility]: [place] [[id number]]  Level of Care: Telemetry [5]  Covid Evaluation: Asymptomatic Screening Protocol (No Symptoms)  Diagnosis: A-fib [facility] [[id number]]  Admitting Physician: Dustin Flock [[id number]]  Attending Physician: Dustin Flock [[id number]]  Estimated length of stay: past midnight tomorrow  Certification:: I certify this patient will need inpatient services for at least 2 midnights  PT Class (Do Not Modify): Inpatient [101]  PT Acc Code (Do Not Modify): Private [1]       B Medical/Surgery History Past Medical History:  Diagnosis Date  . Arthritis    lumbar spine  . Colon cancer ([place])   . Dysphagia   . Headache    migraine  . Hx of adenomatous colonic polyps   . Hyperlipidemia   . Hypertension   . Paroxysmal atrial fibrillation ([facility])   . Skin cancer    Past Surgical History:  Procedure Laterality Date  . ABDOMINAL HYSTERECTOMY     with removal of tubes and ovaries  . COLONOSCOPY    . COLONOSCOPY WITH PROPOFOL N/A 09/03/2017   Procedure: COLONOSCOPY WITH PROPOFOL;  Surgeon: Manya Silvas, MD;  Location: [facility] END[facility]COPY;  Service: Endoscopy;  Laterality: N/A;  . DILATION AND CURETTAGE OF UTERUS    .  ESOPHAGOGASTRODUODENOSCOPY    . skin cancer removal    . TONSILLECTOMY    . TUBAL LIGATION       A IV Location/Drains/Wounds Patient Lines/Drains/Airways Status   Active Line/Drains/Airways    Name:   Placement date:   Placement time:   Site:   Days:   Peripheral IV 07/04/19 Left Forearm   07/04/19    1239    Forearm   less than 1   Peripheral IV 07/04/19 Right Wrist   07/04/19    1857    Wrist   less than 1          Intake/Output Last 24 hours No intake or output data in the 24 hours ending 07/04/19 2027  Labs/Imaging Results for orders placed or performed during the hospital encounter of 07/04/19 (from the past 48 hour(s))  CBC     Status: Abnormal   Collection Time: 07/04/19  1:43 PM  Result Value Ref Range   WBC 10.5 4.0 - 10.5 K/uL   RBC 5.47 (H) 3.87 - 5.11 MIL/uL   Hemoglobin 16.2 (H) 12.0 - 15.0 g/dL   HCT 44.8 36.0 - 46.0 %   MCV 81.9 80.0 - 100.0 fL   MCH 29.6 26.0 - 34.0 pg   MCHC 36.2 (H) 30.0 - 36.0 g/dL   RDW 13.2 11.5 - 15.5 %   Platelets 275 150 - 400 K/uL   nRBC 0.0 0.0 - 0.2 %    Comment: Performed at [employer]  [facility], [address] [place], [place] [address]  Urinalysis, Complete w Microscopic     Status: Abnormal   Collection Time: 07/04/19  1:43 PM  Result Value Ref Range   Color, Urine YELLOW (A) YELLOW   APPearance HAZY (A) CLEAR   Specific Gravity, Urine 1.018 1.005 - 1.030   pH 6.0 5.0 - 8.0   Glucose, UA NEGATIVE NEGATIVE mg/dL   Hgb urine dipstick MODERATE (A) NEGATIVE   Bilirubin Urine NEGATIVE NEGATIVE   Ketones, ur 20 (A) NEGATIVE mg/dL   Protein, ur 30 (A) NEGATIVE mg/dL   Nitrite NEGATIVE NEGATIVE   Leukocytes,Ua TRACE (A) NEGATIVE   RBC / HPF 11-20 0 - 5 RBC/hpf   WBC, UA 0-5 0 - 5 WBC/hpf   Bacteria, UA RARE (A) NONE SEEN   Squamous Epithelial / LPF 0-5 0 - 5   Mucus PRESENT     Comment: Performed at [facility], [address] [place], [place] [address]  Comprehensive metabolic panel     Status: Abnormal    Collection Time: 07/04/19  1:43 PM  Result Value Ref Range   Sodium 122 (L) 135 - 145 mmol/L   Potassium 2.5 (LL) 3.5 - 5.1 mmol/L    Comment: CRITICAL RESULT CALLED TO, READ BACK BY AND VERIFIED WITH BILL SMITH AT 1440 07/04/2019.PMF   Chloride 88 (L) 98 - 111 mmol/L   CO2 21 (L) 22 - 32 mmol/L   Glucose, Bld 189 (H) 70 - 99 mg/dL   BUN 14 8 - 23 mg/dL   Creatinine, Ser 0.93 0.44 - 1.00 mg/dL   Calcium 8.6 (L) 8.9 - 10.3 mg/dL   Total Protein 7.3 6.5 - 8.1 g/dL   Albumin 4.0 3.5 - 5.0 g/dL   AST 23 15 - 41 U/L   ALT 19 0 - 44 U/L   Alkaline Phosphatase 39 38 - 126 U/L   Total Bilirubin 1.0 0.3 - 1.2 mg/dL   GFR calc non Af Amer >60 >60 mL/min   GFR calc Af Amer >60 >60 mL/min   Anion gap 13 5 - 15    Comment: Performed at [facility], [place], [place] [address]  Troponin I (High Sensitivity)     Status: None   Collection Time: 07/04/19  1:43 PM  Result Value Ref Range   Troponin I (High Sensitivity) 6 <18 ng/L    Comment: (NOTE) Elevated high sensitivity troponin I (hsTnI) values and significant  changes across serial measurements may suggest ACS but many other  chronic and acute conditions are known to elevate hsTnI results.  Refer to the "Links" section for chest pain algorithms and additional  guidance. Performed at [facility], [place], [place] [address]    Ct Head Wo Contrast  Result Date: 07/04/2019 CLINICAL DATA:  Near syncopal episodes today, head trauma. EXAM: CT HEAD WITHOUT CONTRAST CT CERVICAL SPINE WITHOUT CONTRAST TECHNIQUE: Multidetector CT imaging of the head and cervical spine was performed following the standard protocol without intravenous contrast. Multiplanar CT image reconstructions of the cervical spine were also generated. COMPARISON:  Report from MRI dated 01/05/1997 FINDINGS: CT HEAD FINDINGS Brain: The brainstem, cerebellum, cerebral peduncles, thalami, basal ganglia, basilar cisterns, and  ventricular system appear within normal limits. No intracranial hemorrhage, mass lesion, or acute CVA. Vascular: There is atherosclerotic calcification of the cavernous carotid arteries bilaterally. Skull: Unremarkable Sinuses/Orbits: Incidental hypoplastic right sphenoid sinus. Otherwise unremarkable where included. Other: No supplemental non-categorized findings. CT CERVICAL  SPINE FINDINGS Alignment: 2 mm degenerative anterolisthesis at C3-4. Skull base and vertebrae: No cervical spine fracture or acute bony finding is identified. Potential Schmorl's node along the superior endplate of T2. Soft tissues and spinal canal: Unremarkable Disc levels: Uncinate and facet spurring cause osseous foraminal stenosis on the left at C3-4, C4-5, C6-7, and C7-T1; and on the right at C3-4, C5-6, and C6-7. Upper chest: Mild biapical pleuroparenchymal scarring Other: No supplemental non-categorized findings. IMPRESSION: 1. No acute intracranial findings and no acute cervical spine findings. 2. Multilevel cervical impingement due to spurring. 3. Atherosclerosis. 4. 2 mm of degenerative anterolisthesis at C3-4. No acute subluxation is identified. Electronically Signed   By: Van Clines M.D.   On: 07/04/2019 15:07   Ct Cervical Spine Wo Contrast  Result Date: 07/04/2019 CLINICAL DATA:  Near syncopal episodes today, head trauma. EXAM: CT HEAD WITHOUT CONTRAST CT CERVICAL SPINE WITHOUT CONTRAST TECHNIQUE: Multidetector CT imaging of the head and cervical spine was performed following the standard protocol without intravenous contrast. Multiplanar CT image reconstructions of the cervical spine were also generated. COMPARISON:  Report from MRI dated 01/05/1997 FINDINGS: CT HEAD FINDINGS Brain: The brainstem, cerebellum, cerebral peduncles, thalami, basal ganglia, basilar cisterns, and ventricular system appear within normal limits. No intracranial hemorrhage, mass lesion, or acute CVA. Vascular: There is atherosclerotic  calcification of the cavernous carotid arteries bilaterally. Skull: Unremarkable Sinuses/Orbits: Incidental hypoplastic right sphenoid sinus. Otherwise unremarkable where included. Other: No supplemental non-categorized findings. CT CERVICAL SPINE FINDINGS Alignment: 2 mm degenerative anterolisthesis at C3-4. Skull base and vertebrae: No cervical spine fracture or acute bony finding is identified. Potential Schmorl's node along the superior endplate of T2. Soft tissues and spinal canal: Unremarkable Disc levels: Uncinate and facet spurring cause osseous foraminal stenosis on the left at C3-4, C4-5, C6-7, and C7-T1; and on the right at C3-4, C5-6, and C6-7. Upper chest: Mild biapical pleuroparenchymal scarring Other: No supplemental non-categorized findings. IMPRESSION: 1. No acute intracranial findings and no acute cervical spine findings. 2. Multilevel cervical impingement due to spurring. 3. Atherosclerosis. 4. 2 mm of degenerative anterolisthesis at C3-4. No acute subluxation is identified. Electronically Signed   By: Van Clines M.D.   On: 07/04/2019 15:07   Dg Chest Portable 1 View  Result Date: 07/04/2019 CLINICAL DATA:  Near syncope EXAM: PORTABLE CHEST 1 VIEW COMPARISON:  None. Patient's prior chest x-ray is not available on PACs for comparison. FINDINGS: The heart size and mediastinal contours are within normal limits. Both lungs are clear. The visualized skeletal structures are unremarkable. IMPRESSION: No active disease. Electronically Signed   By: Abelardo Diesel M.D.   On: 07/04/2019 14:31    Pending Labs Unresulted Labs (From admission, onward)    Start     Ordered   07/04/19 1825  SARS Coronavirus 2 [facility] order, Performed in [facility] hospital lab) Nasopharyngeal Nasopharyngeal Swab  (Symptomatic/High Risk of Exposure Patients Labs with Precautions)  Once,   STAT    Question Answer Comment  Is this test for diagnosis or screening Diagnosis of ill patient   Symptomatic for  COVID-19 as defined by CDC Yes   Date of Symptom Onset 07/02/2019   Hospitalized for COVID-19 Yes   Admitted to ICU for COVID-19 No   Previously tested for COVID-19 No   Resident in a congregate (group) care setting No   Employed in healthcare setting No   Pregnant No      07/04/19 1824   07/04/19 1555  Magnesium  Add-on,   AD  07/04/19 1554   Signed and Held  TSH  Once,   R     Signed and Held   Signed and Held  CBC  Tomorrow morning,   R     Signed and Held   Signed and Held  Basic metabolic panel  Tomorrow morning,   R     Signed and Held          Vitals/Pain Today's Vitals   07/04/19 1800 07/04/19 1930 07/04/19 2000 07/04/19 2026  BP: (!) 140/110 117/86 134/85 134/85  Pulse: 61 (!) 114 (!) 105 95  Resp: (!) 26 16 20 19   Temp:      TempSrc:      SpO2: 98% 94% 98% 98%  Weight:      Height:      PainSc:    0-No pain    Isolation Precautions Airborne and Contact precautions  Medications Medications  potassium chloride 10 mEq in 100 mL IVPB (10 mEq Intravenous New Bag/Given 07/04/19 1834)  0.9 % NaCl with KCl 40 mEq / L  infusion (has no administration in time range)  diltiazem (CARDIZEM) 100 mg in dextrose 5 % 100 mL (1 mg/mL) infusion (5 mg/hr Intravenous New Bag/Given 07/04/19 1948)  LORazepam (ATIVAN) tablet 1 mg (has no administration in time range)  potassium chloride 20 MEQ/15ML (10%) solution 40 mEq (40 mEq Oral Given 07/04/19 1538)  sodium chloride 0.9 % bolus 500 mL (500 mLs Intravenous New Bag/Given 07/04/19 1728)  LORazepam (ATIVAN) tablet 0.25 mg (0.25 mg Oral Given 07/04/19 1722)    Mobility Risk  Moderate fall risk   Focused Assessments Cardiac Assessment Handoff:    No results found for: CKTOTAL, CKMB, CKMBINDEX, TROPONINI No results found for: DDIMER Does the Patient currently have chest pain? No      R Recommendations: See Admitting Provider Note  Report given to:   Additional Notes:

## 2019-07-05 ENCOUNTER — Inpatient Hospital Stay
Admit: 2019-07-05 | Discharge: 2019-07-05 | Disposition: A | Discharge status: Home / Self Care | Payer: Medicare HMO | Attending: Internal Medicine | Admitting: Internal Medicine

## 2019-07-05 LAB — BASIC METABOLIC PANEL
Anion gap: 9 (ref 5–15)
BUN: 14 mg/dL (ref 8–23)
CO2: 24 mmol/L (ref 22–32)
Calcium: 7.5 mg/dL — ABNORMAL LOW (ref 8.9–10.3)
Chloride: 94 mmol/L — ABNORMAL LOW (ref 98–111)
Creatinine, Ser: 0.83 mg/dL (ref 0.44–1.00)
GFR calc Af Amer: >60 mL/min (ref >60)
GFR calc non Af Amer: >60 mL/min (ref >60)
Glucose, Bld: 109 mg/dL — ABNORMAL HIGH (ref 70–99)
Potassium: 2.9 mmol/L — ABNORMAL LOW (ref 3.5–5.1)
Sodium: 127 mmol/L — ABNORMAL LOW (ref 135–145)

## 2019-07-05 LAB — CBC
HCT: 40.4 % (ref 36.0–46.0)
Hemoglobin: 14.3 g/dL (ref 12.0–15.0)
MCH: 30.2 pg (ref 26.0–34.0)
MCHC: 35.4 g/dL (ref 30.0–36.0)
MCV: 85.4 fL (ref 80.0–100.0)
Platelets: 218 10*3/uL (ref 150–400)
RBC: 4.73 MIL/uL (ref 3.87–5.11)
RDW: 13.4 % (ref 11.5–15.5)
WBC: 11.1 10*3/uL — ABNORMAL HIGH (ref 4.0–10.5)
nRBC: 0 % (ref 0.0–0.2)

## 2019-07-05 LAB — C DIFFICILE QUICK SCREEN W PCR REFLEX
C Diff antigen: NEGATIVE
C Diff interpretation: NOT DETECTED
C Diff toxin: NEGATIVE

## 2019-07-05 MED ORDER — POTASSIUM CHLORIDE 20 MEQ/15ML (10%) PO SOLN
40.0000 meq | ORAL | Status: AC
  Administered 2019-07-05 (×2): 40 meq via ORAL
  Filled 2019-07-05 (×2): qty 30

## 2019-07-05 MED ORDER — DILTIAZEM HCL ER COATED BEADS 120 MG PO CP24
240.0000 mg | ORAL_CAPSULE | Freq: Every day | ORAL | Status: DC
  Administered 2019-07-05 – 2019-07-07 (×3): 240 mg via ORAL
  Filled 2019-07-05 (×3): qty 2

## 2019-07-05 MED ORDER — POTASSIUM CHLORIDE 20 MEQ/15ML (10%) PO SOLN
40.0000 meq | ORAL | Status: DC
  Filled 2019-07-05 (×2): qty 30

## 2019-07-05 MED ORDER — SODIUM CHLORIDE 0.9 % IV SOLN
INTRAVENOUS | Status: AC
  Administered 2019-07-05: 10:00:00 via INTRAVENOUS

## 2019-07-05 NOTE — Consult Note (Signed)
[facility] Consultation Note  Patient ID: Caroline Zamora, MRN: [id number], DOB/AGE: Jul 24, 1941 78 y.o. Admit date: 07/04/2019   Date of Consult: 07/05/2019 Primary Physician: Kirk Ruths, MD Primary Cardiologist: Paraschos  Chief Complaint:  Chief Complaint  Patient presents with  . Near Syncope   Reason for Consult: Atrial fibrillation  HPI: 78 y.o. female with paroxysmal nonvalvular atrial fibrillation essential hypertension mixed hyperlipidemia and peripheral vascular disease with aortic atherosclerosis who has had some anxiety and palpitations in recent months.  In the last few weeks she has had more more shortness of breath and palpitations.  The patient came to the emergency room because of concerns of palpitations and presyncope and syncope.  The patient was in her kitchen when she felt dizzy and then fell onto the floor she did not hurt her self.  The patient then tried to get up and move around but still was having some difficulty.  When seen in the emergency room she had an EKG showing atrial fibrillation with rapid ventricular rate and a chest x-ray which was normal and a CT of the head and spine which was showing no evidence of acute injury.  The patient has a troponin of 7 consistent with demand ischemia rather than acute coronary syndrome.  Currently she is on a diltiazem drip with improved heart rate control in addition to her other medications and hemodynamically stable without evidence of significant symptoms today  Past Medical History:  Diagnosis Date  . Arthritis    lumbar spine  . Colon cancer ([place])   . Dysphagia   . Headache    migraine  . Hx of adenomatous colonic polyps   . Hyperlipidemia   . Hypertension   . Paroxysmal atrial fibrillation (HCC)   . Skin cancer       Surgical History:  Past Surgical History:  Procedure Laterality Date  . ABDOMINAL HYSTERECTOMY     with removal of tubes and ovaries  . COLONOSCOPY    . COLONOSCOPY  WITH PROPOFOL N/A 09/03/2017   Procedure: COLONOSCOPY WITH PROPOFOL;  Surgeon: Manya Silvas, MD;  Location: [facility] ENDOSCOPY;  Service: Endoscopy;  Laterality: N/A;  . DILATION AND CURETTAGE OF UTERUS    . ESOPHAGOGASTRODUODENOSCOPY    . skin cancer removal    . TONSILLECTOMY    . TUBAL LIGATION       Home Meds: Prior to Admission medications   Medication Sig Start Date End Date Taking? Authorizing Provider  apixaban (ELIQUIS) 5 MG TABS tablet Take 5 mg by mouth 2 (two) times daily.   Yes [provider]  carvedilol (COREG) 12.5 MG tablet Take 12.5 mg by mouth 2 (two) times daily with a meal.   Yes [provider]  escitalopram (LEXAPRO) 10 MG tablet Take 10 mg by mouth daily. 07/01/19  Yes [provider]  estradiol (ESTRACE) 0.5 MG tablet Take 0.5 mg by mouth daily.   Yes [provider]  gabapentin (NEURONTIN) 100 MG capsule Take 100 mg by mouth 3 (three) times daily as needed. 07/02/19  Yes [provider]  losartan-hydrochlorothiazide (HYZAAR) 100-12.5 MG tablet Take 1 tablet by mouth daily. 04/14/19  Yes [provider]  mometasone (NASONEX) 50 MCG/ACT nasal spray Place 2 sprays into the nose daily as needed.    Yes [provider]    Inpatient Medications:  . apixaban  5 mg Oral BID  . carvedilol  12.5 mg Oral BID WC  . diltiazem  240 mg Oral Daily  .  escitalopram  10 mg Oral Daily  . estradiol  0.5 mg Oral Daily  . gabapentin  100 mg Oral TID  . sodium chloride flush  3 mL Intravenous Q12H   . sodium chloride      Allergies:  Allergies  Allergen Reactions  . Desyrel [Trazodone] Rash  . Sulfa Antibiotics Rash    Social History   Socioeconomic History  . Marital status: Widowed    Spouse name: Not on file  . Number of children: Not on file  . Years of education: Not on file  . Highest education level: Not on file  Occupational History  . Not on file  Social Needs  . Financial resource strain: Not on  file  . Food insecurity    Worry: Not on file    Inability: Not on file  . Transportation needs    Medical: Not on file    Non-medical: Not on file  Tobacco Use  . Smoking status: Never Smoker  . Smokeless tobacco: Never Used  Substance and Sexual Activity  . Alcohol use: Yes  . Drug use: No  . Sexual activity: Not on file  Lifestyle  . Physical activity    Days per week: Not on file    Minutes per session: Not on file  . Stress: Not on file  Relationships  . Social Herbalist on phone: Not on file    Gets together: Not on file    Attends religious service: Not on file    Active member of club or organization: Not on file    Attends meetings of clubs or organizations: Not on file    Relationship status: Not on file  . Intimate partner violence    Fear of current or ex partner: Not on file    Emotionally abused: Not on file    Physically abused: Not on file    Forced sexual activity: Not on file  Other Topics Concern  . Not on file  Social History Narrative  . Not on file     Family History  Problem Relation Age of Onset  . Breast cancer Neg Hx      Review of Systems Positive for palpitations presyncope syncope dizziness Negative for: General:  chills, fever, night sweats or weight changes.  Cardiovascular: PND orthopnea positive for syncope dizziness  Dermatological skin lesions rashes Respiratory: Cough congestion Urologic: Frequent urination urination at night and hematuria Abdominal: negative for nausea, vomiting, diarrhea, bright red blood per rectum, melena, or hematemesis Neurologic: negative for visual changes, and/or hearing changes  All other systems reviewed and are otherwise negative except as noted above.  Labs: No results for input(s): CKTOTAL, CKMB, TROPONINI in the last 72 hours. Lab Results  Component Value Date   WBC 11.1 (H) 07/05/2019   HGB 14.3 07/05/2019   HCT 40.4 07/05/2019   MCV 85.4 07/05/2019   PLT 218 07/05/2019     Recent Labs  Lab 07/04/19 1343 07/05/19 0418  NA 122* 127*  K 2.5* 2.9*  CL 88* 94*  CO2 21* 24  BUN 14 14  CREATININE 0.93 0.83  CALCIUM 8.6* 7.5*  PROT 7.3  --   BILITOT 1.0  --   ALKPHOS 39  --   ALT 19  --   AST 23  --   GLUCOSE 189* 109*   No results found for: CHOL, HDL, LDLCALC, TRIG No results found for: DDIMER  Radiology/Studies:  Ct Head Wo Contrast  Result Date: 07/04/2019 CLINICAL  DATA:  Near syncopal episodes today, head trauma. EXAM: CT HEAD WITHOUT CONTRAST CT CERVICAL SPINE WITHOUT CONTRAST TECHNIQUE: Multidetector CT imaging of the head and cervical spine was performed following the standard protocol without intravenous contrast. Multiplanar CT image reconstructions of the cervical spine were also generated. COMPARISON:  Report from MRI dated 01/05/1997 FINDINGS: CT HEAD FINDINGS Brain: The brainstem, cerebellum, cerebral peduncles, thalami, basal ganglia, basilar cisterns, and ventricular system appear within normal limits. No intracranial hemorrhage, mass lesion, or acute CVA. Vascular: There is atherosclerotic calcification of the cavernous carotid arteries bilaterally. Skull: Unremarkable Sinuses/Orbits: Incidental hypoplastic right sphenoid sinus. Otherwise unremarkable where included. Other: No supplemental non-categorized findings. CT CERVICAL SPINE FINDINGS Alignment: 2 mm degenerative anterolisthesis at C3-4. Skull base and vertebrae: No cervical spine fracture or acute bony finding is identified. Potential Schmorl's node along the superior endplate of T2. Soft tissues and spinal canal: Unremarkable Disc levels: Uncinate and facet spurring cause osseous foraminal stenosis on the left at C3-4, C4-5, C6-7, and C7-T1; and on the right at C3-4, C5-6, and C6-7. Upper chest: Mild biapical pleuroparenchymal scarring Other: No supplemental non-categorized findings. IMPRESSION: 1. No acute intracranial findings and no acute cervical spine findings. 2. Multilevel cervical  impingement due to spurring. 3. Atherosclerosis. 4. 2 mm of degenerative anterolisthesis at C3-4. No acute subluxation is identified. Electronically Signed   By: Van Clines M.D.   On: 07/04/2019 15:07   Ct Cervical Spine Wo Contrast  Result Date: 07/04/2019 CLINICAL DATA:  Near syncopal episodes today, head trauma. EXAM: CT HEAD WITHOUT CONTRAST CT CERVICAL SPINE WITHOUT CONTRAST TECHNIQUE: Multidetector CT imaging of the head and cervical spine was performed following the standard protocol without intravenous contrast. Multiplanar CT image reconstructions of the cervical spine were also generated. COMPARISON:  Report from MRI dated 01/05/1997 FINDINGS: CT HEAD FINDINGS Brain: The brainstem, cerebellum, cerebral peduncles, thalami, basal ganglia, basilar cisterns, and ventricular system appear within normal limits. No intracranial hemorrhage, mass lesion, or acute CVA. Vascular: There is atherosclerotic calcification of the cavernous carotid arteries bilaterally. Skull: Unremarkable Sinuses/Orbits: Incidental hypoplastic right sphenoid sinus. Otherwise unremarkable where included. Other: No supplemental non-categorized findings. CT CERVICAL SPINE FINDINGS Alignment: 2 mm degenerative anterolisthesis at C3-4. Skull base and vertebrae: No cervical spine fracture or acute bony finding is identified. Potential Schmorl's node along the superior endplate of T2. Soft tissues and spinal canal: Unremarkable Disc levels: Uncinate and facet spurring cause osseous foraminal stenosis on the left at C3-4, C4-5, C6-7, and C7-T1; and on the right at C3-4, C5-6, and C6-7. Upper chest: Mild biapical pleuroparenchymal scarring Other: No supplemental non-categorized findings. IMPRESSION: 1. No acute intracranial findings and no acute cervical spine findings. 2. Multilevel cervical impingement due to spurring. 3. Atherosclerosis. 4. 2 mm of degenerative anterolisthesis at C3-4. No acute subluxation is identified.  Electronically Signed   By: Van Clines M.D.   On: 07/04/2019 15:07   Dg Chest Portable 1 View  Result Date: 07/04/2019 CLINICAL DATA:  Near syncope EXAM: PORTABLE CHEST 1 VIEW COMPARISON:  None. Patient's prior chest x-ray is not available on PACs for comparison. FINDINGS: The heart size and mediastinal contours are within normal limits. Both lungs are clear. The visualized skeletal structures are unremarkable. IMPRESSION: No active disease. Electronically Signed   By: Abelardo Diesel M.D.   On: 07/04/2019 14:31    EKG: Atrial fibrillation with rapid ventricular rate with right bundle branch block and nonspecific ST changes and PVCs  Weights: Filed Weights   07/04/19 1329 07/04/19 2131 07/05/19  0510  Weight: 79.4 kg 79.7 kg 79.6 kg     Physical Exam: Blood pressure 106/64, pulse 94, temperature 98 F (36.7 C), temperature source Oral, resp. rate 18, height 5\' 6"  (1.676 m), weight 79.6 kg, SpO2 99 %. Body mass index is 28.31 kg/m. General: Well developed, well nourished, in no acute distress. Head eyes ears nose throat: Normocephalic, atraumatic, sclera non-icteric, no xanthomas, nares are without discharge. No apparent thyromegaly and/or mass  Lungs: Normal respiratory effort.  no wheezes, no rales, no rhonchi.  Heart: RRR with normal S1 S2. no murmur gallop, no rub, PMI is normal size and placement, carotid upstroke normal without bruit, jugular venous pressure is normal Abdomen: Soft, non-tender, non-distended with normoactive bowel sounds. No hepatomegaly. No rebound/guarding. No obvious abdominal masses. Abdominal aorta is normal size without bruit Extremities: No edema. no cyanosis, no clubbing, no ulcers  Peripheral : 2+ bilateral upper extremity pulses, 2+ bilateral femoral pulses, 2+ bilateral dorsal pedal pulse Neuro: Alert and oriented. No facial asymmetry. No focal deficit. Moves all extremities spontaneously. Musculoskeletal: Normal muscle tone without kyphosis Psych:   Responds to questions appropriately with a normal affect.    Assessment: 78 year old female with syncope of unknown etiology without evidence of stroke or myocardial infarction or congestive heart failure with atrial fibrillation with rapid ventricular rate now controlled on diltiazem drip without evidence of further significant symptoms  Plan: 1.  Change diltiazem drip to oral diltiazem for heart rate control of atrial fibrillation and possible spontaneous conversion to normal sinus rhythm 2.  Continue antihypertensive medication management including carvedilol for further hypertension control 3.  Continue further treatment of stroke risk with anticoagulation with Eliquis without change 4.  No further cardiac diagnostics necessary at this time 5.  Okay for ambulation and following for heart rate control and possible discharge to home today if ambulating well with good control with further adjustments of medication management as an outpatient  Signed, Corey Skains M.D. [facility] 07/05/2019, 6:28 AM

## 2019-07-05 NOTE — Progress Notes (Signed)
[facility] NAME: Kitty Cadavid    MR#:  [id number]  DATE OF BIRTH:  [birth year]  SUBJECTIVE:  CHIEF COMPLAINT:   Chief Complaint  Patient presents with  . Near Syncope   Patient feels fatigued.  No palpitations or shortness of breath today. Continues to have diarrhea  REVIEW OF SYSTEMS:    Review of Systems  Constitutional: Positive for malaise/fatigue. Negative for chills, fever and weight loss.  HENT: Negative for hearing loss and nosebleeds.   Eyes: Negative for blurred vision, double vision and pain.  Respiratory: Negative for cough, hemoptysis, sputum production, shortness of breath and wheezing.   Cardiovascular: Negative for chest pain, palpitations, orthopnea and leg swelling.  Gastrointestinal: Positive for diarrhea. Negative for abdominal pain, constipation, nausea and vomiting.  Genitourinary: Negative for dysuria and hematuria.  Musculoskeletal: Negative for back pain, falls and myalgias.  Skin: Negative for rash.  Neurological: Negative for dizziness, tremors, sensory change, speech change, focal weakness, seizures and headaches.  Endo/Heme/Allergies: Does not bruise/bleed easily.  Psychiatric/Behavioral: Negative for depression and memory loss. The patient is not nervous/anxious.     DRUG ALLERGIES:   Allergies  Allergen Reactions  . Desyrel [Trazodone] Rash  . Sulfa Antibiotics Rash    VITALS:  Blood pressure 117/72, pulse 89, temperature 98.4 F (36.9 C), temperature source Oral, resp. rate 18, height 5\' 6"  (1.676 m), weight 79.6 kg, SpO2 98 %.  PHYSICAL EXAMINATION:   Physical Exam  GENERAL:  78 y.o.-year-old patient lying in the bed with no acute distress.  EYES: Pupils equal, round, reactive to light and accommodation. No scleral icterus. Extraocular muscles intact.  HEENT: Head atraumatic, normocephalic. Oropharynx and nasopharynx clear.  NECK:  Supple, no jugular venous distention. No thyroid  enlargement, no tenderness.  LUNGS: Normal breath sounds bilaterally, no wheezing, rales, rhonchi. No use of accessory muscles of respiration.  CARDIOVASCULAR: S1, S2 normal. No murmurs, rubs, or gallops.  ABDOMEN: Soft, nontender, nondistended. Bowel sounds present. No organomegaly or mass.  EXTREMITIES: No cyanosis, clubbing or edema b/l.    NEUROLOGIC: Cranial nerves II through XII are intact. No focal Motor or sensory deficits b/l.   PSYCHIATRIC: The patient is alert and oriented x 3.  SKIN: No obvious rash, lesion, or ulcer.   LABORATORY PANEL:   CBC Recent Labs  Lab 07/05/19 0418  WBC 11.1*  HGB 14.3  HCT 40.4  PLT 218   ------------------------------------------------------------------------------------------------------------------ Chemistries  Recent Labs  Lab 07/04/19 1343 07/04/19 2139 07/05/19 0418  NA 122*  --  127*  K 2.5*  --  2.9*  CL 88*  --  94*  CO2 21*  --  24  GLUCOSE 189*  --  109*  BUN 14  --  14  CREATININE 0.93  --  0.83  CALCIUM 8.6*  --  7.5*  MG  --  2.0  --   AST 23  --   --   ALT 19  --   --   ALKPHOS 39  --   --   BILITOT 1.0  --   --    ------------------------------------------------------------------------------------------------------------------  Cardiac Enzymes No results for input(s): TROPONINI in the last 168 hours. ------------------------------------------------------------------------------------------------------------------  RADIOLOGY:  Ct Head Wo Contrast  Result Date: 07/04/2019 CLINICAL DATA:  Near syncopal episodes today, head trauma. EXAM: CT HEAD WITHOUT CONTRAST CT CERVICAL SPINE WITHOUT CONTRAST TECHNIQUE: Multidetector CT imaging of the head and cervical spine was performed following the standard protocol without intravenous contrast. Multiplanar  CT image reconstructions of the cervical spine were also generated. COMPARISON:  Report from MRI dated 01/05/1997 FINDINGS: CT HEAD FINDINGS Brain: The brainstem,  cerebellum, cerebral peduncles, thalami, basal ganglia, basilar cisterns, and ventricular system appear within normal limits. No intracranial hemorrhage, mass lesion, or acute CVA. Vascular: There is atherosclerotic calcification of the cavernous carotid arteries bilaterally. Skull: Unremarkable Sinuses/Orbits: Incidental hypoplastic right sphenoid sinus. Otherwise unremarkable where included. Other: No supplemental non-categorized findings. CT CERVICAL SPINE FINDINGS Alignment: 2 mm degenerative anterolisthesis at C3-4. Skull base and vertebrae: No cervical spine fracture or acute bony finding is identified. Potential Schmorl's node along the superior endplate of T2. Soft tissues and spinal canal: Unremarkable Disc levels: Uncinate and facet spurring cause osseous foraminal stenosis on the left at C3-4, C4-5, C6-7, and C7-T1; and on the right at C3-4, C5-6, and C6-7. Upper chest: Mild biapical pleuroparenchymal scarring Other: No supplemental non-categorized findings. IMPRESSION: 1. No acute intracranial findings and no acute cervical spine findings. 2. Multilevel cervical impingement due to spurring. 3. Atherosclerosis. 4. 2 mm of degenerative anterolisthesis at C3-4. No acute subluxation is identified. Electronically Signed   By: Van Clines M.D.   On: 07/04/2019 15:07   Ct Cervical Spine Wo Contrast  Result Date: 07/04/2019 CLINICAL DATA:  Near syncopal episodes today, head trauma. EXAM: CT HEAD WITHOUT CONTRAST CT CERVICAL SPINE WITHOUT CONTRAST TECHNIQUE: Multidetector CT imaging of the head and cervical spine was performed following the standard protocol without intravenous contrast. Multiplanar CT image reconstructions of the cervical spine were also generated. COMPARISON:  Report from MRI dated 01/05/1997 FINDINGS: CT HEAD FINDINGS Brain: The brainstem, cerebellum, cerebral peduncles, thalami, basal ganglia, basilar cisterns, and ventricular system appear within normal limits. No intracranial  hemorrhage, mass lesion, or acute CVA. Vascular: There is atherosclerotic calcification of the cavernous carotid arteries bilaterally. Skull: Unremarkable Sinuses/Orbits: Incidental hypoplastic right sphenoid sinus. Otherwise unremarkable where included. Other: No supplemental non-categorized findings. CT CERVICAL SPINE FINDINGS Alignment: 2 mm degenerative anterolisthesis at C3-4. Skull base and vertebrae: No cervical spine fracture or acute bony finding is identified. Potential Schmorl's node along the superior endplate of T2. Soft tissues and spinal canal: Unremarkable Disc levels: Uncinate and facet spurring cause osseous foraminal stenosis on the left at C3-4, C4-5, C6-7, and C7-T1; and on the right at C3-4, C5-6, and C6-7. Upper chest: Mild biapical pleuroparenchymal scarring Other: No supplemental non-categorized findings. IMPRESSION: 1. No acute intracranial findings and no acute cervical spine findings. 2. Multilevel cervical impingement due to spurring. 3. Atherosclerosis. 4. 2 mm of degenerative anterolisthesis at C3-4. No acute subluxation is identified. Electronically Signed   By: Van Clines M.D.   On: 07/04/2019 15:07   Dg Chest Portable 1 View  Result Date: 07/04/2019 CLINICAL DATA:  Near syncope EXAM: PORTABLE CHEST 1 VIEW COMPARISON:  None. Patient's prior chest x-ray is not available on PACs for comparison. FINDINGS: The heart size and mediastinal contours are within normal limits. Both lungs are clear. The visualized skeletal structures are unremarkable. IMPRESSION: No active disease. Electronically Signed   By: Abelardo Diesel M.D.   On: 07/04/2019 14:31     ASSESSMENT AND PLAN:   Patient 78 year old with history of paroxysmal atrial fibrillation presenting with syncope  *Atrial fibrillation with rapid ventricular rate Change Cardizem drip to oral Cardizem.  Continue Coreg On Eliquis at home. Appreciate cardiology input. Monitor on telemetry for any further tachycardic  episodes  *Diarrhea.  C. difficile test has been ordered and pending.  *Severe hyponatremia.  Due to dehydration.  Improving.  Today is 127.  Continue IV fluids.  *Hypokalemia secondary to diarrhea and decreased oral intake.  Replace through IV and oral.  Telemetry monitoring.  *Anxiety.  On Lexapro.  *Hypertension.  Hydrochlorothiazide-losartan held.  On Coreg and Cardizem.  Well controlled.  All the records are reviewed and case discussed with Care Management/Social Worker Management plans discussed with the patient, family and they are in agreement.  CODE STATUS: FULL CODE  TOTAL TIME TAKING CARE OF THIS PATIENT: 35 minutes.   POSSIBLE D/C IN 1-2 DAYS, DEPENDING ON CLINICAL CONDITION.  Leia Alf Parth Mccormac M.D on 07/05/2019 at 1:41 PM  Between 7am to 6pm - Pager - [phone]  After 6pm go to [email or web address] - password [email or web address] [facility]  Office  [phone]  CC: Primary care physician; Kirk Ruths, MD  Note: This dictation was prepared with Dragon dictation along with smaller phrase technology. Any transcriptional errors that result from this process are unintentional.

## 2019-07-06 ENCOUNTER — Inpatient Hospital Stay: Payer: Medicare HMO

## 2019-07-06 LAB — ECHOCARDIOGRAM COMPLETE
Height: 66 in
Weight: 2806.4 oz

## 2019-07-06 LAB — BASIC METABOLIC PANEL
Anion gap: 6 (ref 5–15)
BUN: 18 mg/dL (ref 8–23)
CO2: 23 mmol/L (ref 22–32)
Calcium: 7.8 mg/dL — ABNORMAL LOW (ref 8.9–10.3)
Chloride: 100 mmol/L (ref 98–111)
Creatinine, Ser: 0.84 mg/dL (ref 0.44–1.00)
GFR calc Af Amer: >60 mL/min (ref >60)
GFR calc non Af Amer: >60 mL/min (ref >60)
Glucose, Bld: 96 mg/dL (ref 70–99)
Potassium: 3.9 mmol/L (ref 3.5–5.1)
Sodium: 129 mmol/L — ABNORMAL LOW (ref 135–145)

## 2019-07-06 MED ORDER — LOPERAMIDE HCL 2 MG PO CAPS
2.0000 mg | ORAL_CAPSULE | Freq: Four times a day (QID) | ORAL | Status: DC | PRN
  Administered 2019-07-06: 2 mg via ORAL

## 2019-07-06 MED ORDER — LOPERAMIDE HCL 2 MG PO CAPS
4.0000 mg | ORAL_CAPSULE | Freq: Once | ORAL | Status: DC
  Filled 2019-07-06: qty 2

## 2019-07-06 NOTE — Progress Notes (Signed)
[facility] [place] NAME: Caroline Zamora    MR#:  [id number]  DATE OF BIRTH:  [birth year]  SUBJECTIVE:  CHIEF COMPLAINT:   Chief Complaint  Patient presents with  . Near Syncope   Continues to have diarrhea.  C. difficile negative. Feels dizzy.  Diplopia for last 3 days since she started Neurontin.  REVIEW OF SYSTEMS:    Review of Systems  Constitutional: Positive for malaise/fatigue. Negative for chills, fever and weight loss.  HENT: Negative for hearing loss and nosebleeds.   Eyes: Negative for blurred vision, double vision and pain.  Respiratory: Negative for cough, hemoptysis, sputum production, shortness of breath and wheezing.   Cardiovascular: Negative for chest pain, palpitations, orthopnea and leg swelling.  Gastrointestinal: Positive for diarrhea. Negative for abdominal pain, constipation, nausea and vomiting.  Genitourinary: Negative for dysuria and hematuria.  Musculoskeletal: Negative for back pain, falls and myalgias.  Skin: Negative for rash.  Neurological: Negative for dizziness, tremors, sensory change, speech change, focal weakness, seizures and headaches.  Endo/Heme/Allergies: Does not bruise/bleed easily.  Psychiatric/Behavioral: Negative for depression and memory loss. The patient is not nervous/anxious.    DRUG ALLERGIES:   Allergies  Allergen Reactions  . Desyrel [Trazodone] Rash  . Sulfa Antibiotics Rash    VITALS:  Blood pressure 121/68, pulse 68, temperature 98.3 F (36.8 C), temperature source Oral, resp. rate 18, height 5\' 6"  (1.676 m), weight 80.5 kg, SpO2 98 %.  PHYSICAL EXAMINATION:   Physical Exam  GENERAL:  78 y.o.-year-old patient lying in the bed with no acute distress.  EYES: Pupils equal, round, reactive to light and accommodation. No scleral icterus. Extraocular muscles intact.  HEENT: Head atraumatic, normocephalic. Oropharynx and nasopharynx clear.  NECK:  Supple, no jugular venous  distention. No thyroid enlargement, no tenderness.  LUNGS: Normal breath sounds bilaterally, no wheezing, rales, rhonchi. No use of accessory muscles of respiration.  CARDIOVASCULAR: S1, S2 normal. No murmurs, rubs, or gallops.  ABDOMEN: Soft, nontender, nondistended. Bowel sounds present. No organomegaly or mass.  EXTREMITIES: No cyanosis, clubbing or edema b/l.    NEUROLOGIC: Cranial nerves II through XII are intact. No focal Motor or sensory deficits b/l.   PSYCHIATRIC: The patient is alert and oriented x 3.   SKIN: No obvious rash, lesion, or ulcer.   LABORATORY PANEL:   CBC Recent Labs  Lab 07/05/19 0418  WBC 11.1*  HGB 14.3  HCT 40.4  PLT 218   ------------------------------------------------------------------------------------------------------------------ Chemistries  Recent Labs  Lab 07/04/19 1343 07/04/19 2139  07/06/19 0355  NA 122*  --    < > 129*  K 2.5*  --    < > 3.9  CL 88*  --    < > 100  CO2 21*  --    < > 23  GLUCOSE 189*  --    < > 96  BUN 14  --    < > 18  CREATININE 0.93  --    < > 0.84  CALCIUM 8.6*  --    < > 7.8*  MG  --  2.0  --   --   AST 23  --   --   --   ALT 19  --   --   --   ALKPHOS 39  --   --   --   BILITOT 1.0  --   --   --    < > = values in this interval not displayed.   ------------------------------------------------------------------------------------------------------------------  Cardiac Enzymes No results for input(s): TROPONINI in the last 168 hours. ------------------------------------------------------------------------------------------------------------------  RADIOLOGY:  Ct Head Wo Contrast  Result Date: 07/04/2019 CLINICAL DATA:  Near syncopal episodes today, head trauma. EXAM: CT HEAD WITHOUT CONTRAST CT CERVICAL SPINE WITHOUT CONTRAST TECHNIQUE: Multidetector CT imaging of the head and cervical spine was performed following the standard protocol without intravenous contrast. Multiplanar CT image reconstructions of  the cervical spine were also generated. COMPARISON:  Report from MRI dated 01/05/1997 FINDINGS: CT HEAD FINDINGS Brain: The brainstem, cerebellum, cerebral peduncles, thalami, basal ganglia, basilar cisterns, and ventricular system appear within normal limits. No intracranial hemorrhage, mass lesion, or acute CVA. Vascular: There is atherosclerotic calcification of the cavernous carotid arteries bilaterally. Skull: Unremarkable Sinuses/Orbits: Incidental hypoplastic right sphenoid sinus. Otherwise unremarkable where included. Other: No supplemental non-categorized findings. CT CERVICAL SPINE FINDINGS Alignment: 2 mm degenerative anterolisthesis at C3-4. Skull base and vertebrae: No cervical spine fracture or acute bony finding is identified. Potential Schmorl's node along the superior endplate of T2. Soft tissues and spinal canal: Unremarkable Disc levels: Uncinate and facet spurring cause osseous foraminal stenosis on the left at C3-4, C4-5, C6-7, and C7-T1; and on the right at C3-4, C5-6, and C6-7. Upper chest: Mild biapical pleuroparenchymal scarring Other: No supplemental non-categorized findings. IMPRESSION: 1. No acute intracranial findings and no acute cervical spine findings. 2. Multilevel cervical impingement due to spurring. 3. Atherosclerosis. 4. 2 mm of degenerative anterolisthesis at C3-4. No acute subluxation is identified. Electronically Signed   By: Van Clines M.D.   On: 07/04/2019 15:07   Ct Cervical Spine Wo Contrast  Result Date: 07/04/2019 CLINICAL DATA:  Near syncopal episodes today, head trauma. EXAM: CT HEAD WITHOUT CONTRAST CT CERVICAL SPINE WITHOUT CONTRAST TECHNIQUE: Multidetector CT imaging of the head and cervical spine was performed following the standard protocol without intravenous contrast. Multiplanar CT image reconstructions of the cervical spine were also generated. COMPARISON:  Report from MRI dated 01/05/1997 FINDINGS: CT HEAD FINDINGS Brain: The brainstem,  cerebellum, cerebral peduncles, thalami, basal ganglia, basilar cisterns, and ventricular system appear within normal limits. No intracranial hemorrhage, mass lesion, or acute CVA. Vascular: There is atherosclerotic calcification of the cavernous carotid arteries bilaterally. Skull: Unremarkable Sinuses/Orbits: Incidental hypoplastic right sphenoid sinus. Otherwise unremarkable where included. Other: No supplemental non-categorized findings. CT CERVICAL SPINE FINDINGS Alignment: 2 mm degenerative anterolisthesis at C3-4. Skull base and vertebrae: No cervical spine fracture or acute bony finding is identified. Potential Schmorl's node along the superior endplate of T2. Soft tissues and spinal canal: Unremarkable Disc levels: Uncinate and facet spurring cause osseous foraminal stenosis on the left at C3-4, C4-5, C6-7, and C7-T1; and on the right at C3-4, C5-6, and C6-7. Upper chest: Mild biapical pleuroparenchymal scarring Other: No supplemental non-categorized findings. IMPRESSION: 1. No acute intracranial findings and no acute cervical spine findings. 2. Multilevel cervical impingement due to spurring. 3. Atherosclerosis. 4. 2 mm of degenerative anterolisthesis at C3-4. No acute subluxation is identified. Electronically Signed   By: Van Clines M.D.   On: 07/04/2019 15:07   Dg Chest Portable 1 View  Result Date: 07/04/2019 CLINICAL DATA:  Near syncope EXAM: PORTABLE CHEST 1 VIEW COMPARISON:  None. Patient's prior chest x-ray is not available on PACs for comparison. FINDINGS: The heart size and mediastinal contours are within normal limits. Both lungs are clear. The visualized skeletal structures are unremarkable. IMPRESSION: No active disease. Electronically Signed   By: Abelardo Diesel M.D.   On: 07/04/2019 14:31     ASSESSMENT AND PLAN:  Patient 78 year old with history of paroxysmal atrial fibrillation presenting with syncope  *Diplopia and dizziness. Patient mentions that diplopia started  immediately after she started Neurontin.  Will stop Neurontin. We will also check MRI of the brain due to risk of stroke from her atrial fibrillation. No other focal neurological deficits.  *Atrial fibrillation with rapid ventricular rate Was on Cardizem drip.  Stopped.  Now on oral Coreg and Cardizem.  In normal sinus rhythm. On Eliquis. Appreciate cardiology input.  *Diarrhea.  C. Difficile negative. Start Imodium as needed  *Severe hyponatremia.  Due to dehydration from diarrhea. Improved to 129.  *Hypokalemia secondary to diarrhea and decreased oral intake.  Replaced and improved  *Anxiety.  On Lexapro.  *Hypertension.  Hydrochlorothiazide-losartan held.  On Coreg and Cardizem.  Well controlled.  All the records are reviewed and case discussed with Care Management/Social Worker Management plans discussed with the patient, family and they are in agreement.  CODE STATUS: FULL CODE  TOTAL TIME TAKING CARE OF THIS PATIENT: 35 minutes.   POSSIBLE D/C IN 1-2 DAYS, DEPENDING ON CLINICAL CONDITION.  Leia Alf Kameran Mcneese M.D on 07/06/2019 at 12:37 PM  Between 7am to 6pm - Pager - [phone]  After 6pm go to [email or web address] - password [email or web address]  Office  [phone]  CC: Primary care physician; Kirk Ruths, MD  Note: This dictation was prepared with Dragon dictation along with smaller phrase technology. Any transcriptional errors that result from this process are unintentional.

## 2019-07-06 NOTE — Progress Notes (Signed)
[facility] Encounter Note  Patient: Caroline Zamora / Admit Date: 07/04/2019 / Date of Encounter: 07/06/2019, 6:56 AM   Subjective: Is feeling much better today.  No evidence of chest pain weakness fatigue dizziness or other symptoms.  Patient has converted back into the normal sinus rhythm with good heart rate control.  By patient is tolerating ambulation.  Troponin with minimal elevation consistent with demand ischemia rather than acute coronary  Review of Systems: Positive for: None Negative for: Vision change, hearing change, syncope, dizziness, nausea, vomiting,diarrhea, bloody stool, stomach pain, cough, congestion, diaphoresis, urinary frequency, urinary pain,skin lesions, skin rashes Others previously listed  Objective: Telemetry: Normal sinus rhythm Physical Exam: Blood pressure 132/74, pulse 67, temperature 98 F (36.7 C), temperature source Oral, resp. rate 20, height 5\' 6"  (1.676 m), weight 80.5 kg, SpO2 98 %. Body mass index is 28.63 kg/m. General: Well developed, well nourished, in no acute distress. Head: Normocephalic, atraumatic, sclera non-icteric, no xanthomas, nares are without discharge. Neck: No apparent masses Lungs: Normal respirations with no wheezes, no rhonchi, no rales , no crackles   Heart: Regular rate and rhythm, normal S1 S2, no murmur, no rub, no gallop, PMI is normal size and placement, carotid upstroke normal without bruit, jugular venous pressure normal Abdomen: Soft, non-tender, non-distended with normoactive bowel sounds. No hepatosplenomegaly. Abdominal aorta is normal size without bruit Extremities: No edema, no clubbing, no cyanosis, no ulcers,  Peripheral: 2+ radial, 2+ femoral, 2+ dorsal pedal pulses Neuro: Alert and oriented. Moves all extremities spontaneously. Psych:  Responds to questions appropriately with a normal affect.   Intake/Output Summary (Last 24 hours) at 07/06/2019 0656 Last data filed at 07/06/2019  0026 Gross per 24 hour  Intake 573.78 ml  Output 0 ml  Net 573.78 ml    Inpatient Medications:  . apixaban  5 mg Oral BID  . carvedilol  12.5 mg Oral BID WC  . diltiazem  240 mg Oral Daily  . escitalopram  10 mg Oral Daily  . estradiol  0.5 mg Oral Daily  . gabapentin  100 mg Oral TID  . sodium chloride flush  3 mL Intravenous Q12H   Infusions:  . sodium chloride      Labs: Recent Labs    07/04/19 2139 07/05/19 0418 07/06/19 0355  NA  --  127* 129*  K  --  2.9* 3.9  CL  --  94* 100  CO2  --  24 23  GLUCOSE  --  109* 96  BUN  --  14 18  CREATININE  --  0.83 0.84  CALCIUM  --  7.5* 7.8*  MG 2.0  --   --    Recent Labs    07/04/19 1343  AST 23  ALT 19  ALKPHOS 39  BILITOT 1.0  PROT 7.3  ALBUMIN 4.0   Recent Labs    07/04/19 1343 07/05/19 0418  WBC 10.5 11.1*  HGB 16.2* 14.3  HCT 44.8 40.4  MCV 81.9 85.4  PLT 275 218   No results for input(s): CKTOTAL, CKMB, TROPONINI in the last 72 hours. Invalid input(s): POCBNP No results for input(s): HGBA1C in the last 72 hours.   Weights: Filed Weights   07/04/19 2131 07/05/19 0510 07/06/19 0309  Weight: 79.7 kg 79.6 kg 80.5 kg     Radiology/Studies:  Ct Head Wo Contrast  Result Date: 07/04/2019 CLINICAL DATA:  Near syncopal episodes today, head trauma. EXAM: CT HEAD WITHOUT CONTRAST CT CERVICAL SPINE WITHOUT CONTRAST TECHNIQUE: Multidetector  CT imaging of the head and cervical spine was performed following the standard protocol without intravenous contrast. Multiplanar CT image reconstructions of the cervical spine were also generated. COMPARISON:  Report from MRI dated 01/05/1997 FINDINGS: CT HEAD FINDINGS Brain: The brainstem, cerebellum, cerebral peduncles, thalami, basal ganglia, basilar cisterns, and ventricular system appear within normal limits. No intracranial hemorrhage, mass lesion, or acute CVA. Vascular: There is atherosclerotic calcification of the cavernous carotid arteries bilaterally. Skull:  Unremarkable Sinuses/Orbits: Incidental hypoplastic right sphenoid sinus. Otherwise unremarkable where included. Other: No supplemental non-categorized findings. CT CERVICAL SPINE FINDINGS Alignment: 2 mm degenerative anterolisthesis at C3-4. Skull base and vertebrae: No cervical spine fracture or acute bony finding is identified. Potential Schmorl's node along the superior endplate of T2. Soft tissues and spinal canal: Unremarkable Disc levels: Uncinate and facet spurring cause osseous foraminal stenosis on the left at C3-4, C4-5, C6-7, and C7-T1; and on the right at C3-4, C5-6, and C6-7. Upper chest: Mild biapical pleuroparenchymal scarring Other: No supplemental non-categorized findings. IMPRESSION: 1. No acute intracranial findings and no acute cervical spine findings. 2. Multilevel cervical impingement due to spurring. 3. Atherosclerosis. 4. 2 mm of degenerative anterolisthesis at C3-4. No acute subluxation is identified. Electronically Signed   By: Van Clines M.D.   On: 07/04/2019 15:07   Ct Cervical Spine Wo Contrast  Result Date: 07/04/2019 CLINICAL DATA:  Near syncopal episodes today, head trauma. EXAM: CT HEAD WITHOUT CONTRAST CT CERVICAL SPINE WITHOUT CONTRAST TECHNIQUE: Multidetector CT imaging of the head and cervical spine was performed following the standard protocol without intravenous contrast. Multiplanar CT image reconstructions of the cervical spine were also generated. COMPARISON:  Report from MRI dated 01/05/1997 FINDINGS: CT HEAD FINDINGS Brain: The brainstem, cerebellum, cerebral peduncles, thalami, basal ganglia, basilar cisterns, and ventricular system appear within normal limits. No intracranial hemorrhage, mass lesion, or acute CVA. Vascular: There is atherosclerotic calcification of the cavernous carotid arteries bilaterally. Skull: Unremarkable Sinuses/Orbits: Incidental hypoplastic right sphenoid sinus. Otherwise unremarkable where included. Other: No supplemental  non-categorized findings. CT CERVICAL SPINE FINDINGS Alignment: 2 mm degenerative anterolisthesis at C3-4. Skull base and vertebrae: No cervical spine fracture or acute bony finding is identified. Potential Schmorl's node along the superior endplate of T2. Soft tissues and spinal canal: Unremarkable Disc levels: Uncinate and facet spurring cause osseous foraminal stenosis on the left at C3-4, C4-5, C6-7, and C7-T1; and on the right at C3-4, C5-6, and C6-7. Upper chest: Mild biapical pleuroparenchymal scarring Other: No supplemental non-categorized findings. IMPRESSION: 1. No acute intracranial findings and no acute cervical spine findings. 2. Multilevel cervical impingement due to spurring. 3. Atherosclerosis. 4. 2 mm of degenerative anterolisthesis at C3-4. No acute subluxation is identified. Electronically Signed   By: Van Clines M.D.   On: 07/04/2019 15:07   Dg Chest Portable 1 View  Result Date: 07/04/2019 CLINICAL DATA:  Near syncope EXAM: PORTABLE CHEST 1 VIEW COMPARISON:  None. Patient's prior chest x-ray is not available on PACs for comparison. FINDINGS: The heart size and mediastinal contours are within normal limits. Both lungs are clear. The visualized skeletal structures are unremarkable. IMPRESSION: No active disease. Electronically Signed   By: Abelardo Diesel M.D.   On: 07/04/2019 14:31     Assessment and Recommendation  78 y.o. female with paroxysmal nonvalvular atrial fibrillation hypertension hyperlipidemia peripheral vascular disease with atrial fibrillation with rapid ventricular rate now improved and back in normal rhythm with appropriate medication management 1.  Continue current diltiazem dose for maintenance of normal sinus rhythm and  additional carvedilol for hypertension control 2.  Anticoagulation for further risk reduction in stroke with atrial fibrillation 3.  No further cardiac diagnostics necessary at this time 4.  Begin ambulation and follow-up for improvements of  symptoms.  If patient doing well okay for discharged home with follow-up with next week and adjustments of medication  Signed, Serafina Royals M.D. FACC

## 2019-07-06 NOTE — Plan of Care (Signed)
  Problem: Clinical Measurements: Goal: Ability to maintain clinical measurements within normal limits will improve Outcome: Progressing   Problem: Safety: Goal: Ability to remain free from injury will improve Outcome: Progressing   Problem: Activity: Goal: Ability to tolerate increased activity will improve Outcome: Progressing   Problem: Cardiac: Goal: Ability to achieve and maintain adequate cardiopulmonary perfusion will improve Outcome: Progressing

## 2019-07-07 LAB — BASIC METABOLIC PANEL
Anion gap: 4 — ABNORMAL LOW (ref 5–15)
BUN: 17 mg/dL (ref 8–23)
CO2: 25 mmol/L (ref 22–32)
Calcium: 7.6 mg/dL — ABNORMAL LOW (ref 8.9–10.3)
Chloride: 107 mmol/L (ref 98–111)
Creatinine, Ser: 0.98 mg/dL (ref 0.44–1.00)
GFR calc Af Amer: >60 mL/min (ref >60)
GFR calc non Af Amer: 56 mL/min — ABNORMAL LOW (ref >60)
Glucose, Bld: 100 mg/dL — ABNORMAL HIGH (ref 70–99)
Potassium: 4 mmol/L (ref 3.5–5.1)
Sodium: 136 mmol/L (ref 135–145)

## 2019-07-07 MED ORDER — DILTIAZEM HCL ER COATED BEADS 240 MG PO CP24: 240.0000 mg | ORAL_CAPSULE | Freq: Every day | ORAL | 0 refills | Status: DC

## 2019-07-07 MED ORDER — LORAZEPAM 1 MG PO TABS: 1.0000 mg | ORAL_TABLET | Freq: Four times a day (QID) | ORAL | 0 refills | Status: DC | PRN

## 2019-07-07 NOTE — Care Management Important Message (Signed)
Important Message  Patient Details  Name: Caroline Zamora MRN: [id number] Date of Birth: [birth year]   Medicare Important Message Given:  Yes     Dannette Barbara 07/07/2019, 11:46 AM

## 2019-07-07 NOTE — Progress Notes (Signed)
IVs and tele removed from patient/ Discharge instructions given to patient along with hard copy prescription. No acute distress at this time. Son to pick up patient and transport home after lunch.

## 2019-07-07 NOTE — Discharge Summary (Signed)
[facility] NAME: Caroline Zamora    MR#:  [id number]  DATE OF BIRTH:  [birth year]  DATE OF ADMISSION:  07/04/2019 ADMITTING PHYSICIAN: Dustin Flock, MD  DATE OF DISCHARGE: 07/07/2019  PRIMARY CARE PHYSICIAN: Kirk Ruths, MD    ADMISSION DIAGNOSIS:  Syncope and collapse [R55]  DISCHARGE DIAGNOSIS:  Active Problems:   A-fib ([place])   SECONDARY DIAGNOSIS:   Past Medical History:  Diagnosis Date  . Arthritis    lumbar spine  . Colon cancer ([place])   . Dysphagia   . Headache    migraine  . Hx of adenomatous colonic polyps   . Hyperlipidemia   . Hypertension   . Paroxysmal atrial fibrillation ([facility])   . Skin cancer     HOSPITAL COURSE:  78 year old female with history of anxiety and PAF who presented with syncope.  1.  Syncope: Etiology of syncope is unknown.  Patient has no evidence of stroke or myocardial infarction. She was evaluated by cardiology.  2.  Atrial fibrillation with RVR: Heart rate is better controlled and she is NSR at discharge.  She is on diltiazem and Coreg for heart rate control.  She is on Eliquis for CVA prevention.  3.  Anxiety: Patient will be discharged with PRN Ativan.  She will also continue Lexapro.  She needs to follow-up with her PCP.  4.  Diarrhea: C. difficile testing was negative.  Diarrhea has resolved.  5.  Diplopia: Patient was complaining of double vision.  MRI was negative for acute stroke.  .  Diplopia is thought to be due to gabapentin.  She reports that since she started gabapentin she had double vision.  If her symptoms persist she will need to see a ophthalmologist.  Obviously gabapentin has been discontinued.  DISCHARGE CONDITIONS AND DIET:   Stable Cardiac diet  CONSULTS OBTAINED:  Treatment Team:  Bettey Costa, MD  DRUG ALLERGIES:   Allergies  Allergen Reactions  . Desyrel [Trazodone] Rash  . Sulfa Antibiotics Rash    DISCHARGE MEDICATIONS:   Allergies  as of 07/07/2019      Reactions   Desyrel [trazodone] Rash   Sulfa Antibiotics Rash      Medication List    STOP taking these medications   gabapentin 100 MG capsule Commonly known as: NEURONTIN   losartan-hydrochlorothiazide 100-12.5 MG tablet Commonly known as: HYZAAR     TAKE these medications   carvedilol 12.5 MG tablet Commonly known as: COREG Take 12.5 mg by mouth 2 (two) times daily with a meal.   diltiazem 240 MG 24 hr capsule Commonly known as: CARDIZEM CD Take 1 capsule (240 mg total) by mouth daily. Start taking on: July 08, 2019   Eliquis 5 MG Tabs tablet Generic drug: apixaban Take 5 mg by mouth 2 (two) times daily.   escitalopram 10 MG tablet Commonly known as: LEXAPRO Take 10 mg by mouth daily.   estradiol 0.5 MG tablet Commonly known as: ESTRACE Take 0.5 mg by mouth daily.   LORazepam 1 MG tablet Commonly known as: ATIVAN Take 1 tablet (1 mg total) by mouth every 6 (six) hours as needed for anxiety.   mometasone 50 MCG/ACT nasal spray Commonly known as: NASONEX Place 2 sprays into the nose daily as needed.         Today   CHIEF COMPLAINT:  Anxious no diarrhea no chest pain heart rate controlled this am   VITAL SIGNS:  Blood pressure (!) 141/62, pulse  63, temperature 98.5 F (36.9 C), temperature source Oral, resp. rate 17, height 5\' 6"  (1.676 m), weight 79.3 kg, SpO2 97 %.   REVIEW OF SYSTEMS:  Review of Systems  Constitutional: Negative.  Negative for chills, fever and malaise/fatigue.  HENT: Negative.  Negative for ear discharge, ear pain, hearing loss, nosebleeds and sore throat.   Eyes: Negative.  Negative for blurred vision and pain.  Respiratory: Negative.  Negative for cough, hemoptysis, shortness of breath and wheezing.   Cardiovascular: Negative.  Negative for chest pain, palpitations and leg swelling.  Gastrointestinal: Negative.  Negative for abdominal pain, blood in stool, diarrhea, nausea and vomiting.  Genitourinary:  Negative.  Negative for dysuria.  Musculoskeletal: Negative.  Negative for back pain.  Skin: Negative.   Neurological: Negative for dizziness, tremors, speech change, focal weakness, seizures and headaches.  Endo/Heme/Allergies: Negative.  Does not bruise/bleed easily.  Psychiatric/Behavioral: Negative for depression, hallucinations and suicidal ideas. The patient is nervous/anxious.      PHYSICAL EXAMINATION:  GENERAL:  78 y.o.-year-old patient lying in the bed with no acute distress.  NECK:  Supple, no jugular venous distention. No thyroid enlargement, no tenderness.  LUNGS: Normal breath sounds bilaterally, no wheezing, rales,rhonchi  No use of accessory muscles of respiration.  CARDIOVASCULAR: S1, S2 normal. No murmurs, rubs, or gallops.  ABDOMEN: Soft, non-tender, non-distended. Bowel sounds present. No organomegaly or mass.  EXTREMITIES: No pedal edema, cyanosis, or clubbing.  PSYCHIATRIC: The patient is alert and oriented x 3.  SKIN: No obvious rash, lesion, or ulcer.   DATA REVIEW:   CBC Recent Labs  Lab 07/05/19 0418  WBC 11.1*  HGB 14.3  HCT 40.4  PLT 218    Chemistries  Recent Labs  Lab 07/04/19 1343 07/04/19 2139  07/07/19 0445  NA 122*  --    < > 136  K 2.5*  --    < > 4.0  CL 88*  --    < > 107  CO2 21*  --    < > 25  GLUCOSE 189*  --    < > 100*  BUN 14  --    < > 17  CREATININE 0.93  --    < > 0.98  CALCIUM 8.6*  --    < > 7.6*  MG  --  2.0  --   --   AST 23  --   --   --   ALT 19  --   --   --   ALKPHOS 39  --   --   --   BILITOT 1.0  --   --   --    < > = values in this interval not displayed.    Cardiac Enzymes No results for input(s): TROPONINI in the last 168 hours.  Microbiology Results  @MICRORSLT48 @  RADIOLOGY:  Mr Brain 27 Contrast  Result Date: 07/06/2019 CLINICAL DATA:  78 year old female with weakness, falls. EXAM: MRI HEAD WITHOUT CONTRAST TECHNIQUE: Multiplanar, multiecho pulse sequences of the brain and surrounding structures  were obtained without intravenous contrast. COMPARISON:  Head and cervical spine CT 07/04/2019. FINDINGS: Brain: Cerebral volume is within normal limits for age. No restricted diffusion to suggest acute infarction. No midline shift, mass effect, evidence of mass lesion, ventriculomegaly, extra-axial collection or acute intracranial hemorrhage. Cervicomedullary junction and pituitary are within normal limits. Pearline Cables and white matter signal is within normal limits for age throughout the brain. No cortical encephalomalacia or chronic blood products. A small intraventricular adhesion at the  right frontal horn is suspected on series 16, image 96 (appears inconsequential). The deep gray matter nuclei, brainstem and cerebellum appear normal for age. Vascular: Major intracranial vascular flow voids are preserved. The left vertebral artery appears mildly dominant. Skull and upper cervical spine: Negative visible cervical spine. Visualized bone marrow signal is within normal limits. Sinuses/Orbits: Negative orbits. Paranasal sinuses and mastoids are stable and well pneumatized. Other: Visible internal auditory structures appear normal. Scalp and face soft tissues appear negative. IMPRESSION: Normal for age noncontrast MRI appearance of the brain. Electronically Signed   By: Genevie Ann M.D.   On: 07/06/2019 14:26      Allergies as of 07/07/2019      Reactions   Desyrel [trazodone] Rash   Sulfa Antibiotics Rash      Medication List    STOP taking these medications   gabapentin 100 MG capsule Commonly known as: NEURONTIN   losartan-hydrochlorothiazide 100-12.5 MG tablet Commonly known as: HYZAAR     TAKE these medications   carvedilol 12.5 MG tablet Commonly known as: COREG Take 12.5 mg by mouth 2 (two) times daily with a meal.   diltiazem 240 MG 24 hr capsule Commonly known as: CARDIZEM CD Take 1 capsule (240 mg total) by mouth daily. Start taking on: July 08, 2019   Eliquis 5 MG Tabs tablet Generic  drug: apixaban Take 5 mg by mouth 2 (two) times daily.   escitalopram 10 MG tablet Commonly known as: LEXAPRO Take 10 mg by mouth daily.   estradiol 0.5 MG tablet Commonly known as: ESTRACE Take 0.5 mg by mouth daily.   LORazepam 1 MG tablet Commonly known as: ATIVAN Take 1 tablet (1 mg total) by mouth every 6 (six) hours as needed for anxiety.   mometasone 50 MCG/ACT nasal spray Commonly known as: NASONEX Place 2 sprays into the nose daily as needed.         Management plans discussed with the patient and she is in agreement. Stable for discharge home  Patient should follow up with cardiology  CODE STATUS:     Code Status Orders  (From admission, onward)         Start     Ordered   07/04/19 2129  Full code  Continuous     07/04/19 2128        Code Status History    This patient has a current code status but no historical code status.   Advance Care Planning Activity    Advance Directive Documentation     Most Recent Value  Type of Advance Directive  Living will  Pre-existing out of facility DNR order (yellow form or pink MOST form)  -  "MOST" Form in Place?  -      TOTAL TIME TAKING CARE OF THIS PATIENT: 38 minutes.    Note: This dictation was prepared with Dragon dictation along with smaller phrase technology. Any transcriptional errors that result from this process are unintentional.  Bettey Costa M.D on 07/07/2019 at 10:38 AM  Between 7am to 6pm - Pager - [phone] After 6pm go to [email or web address] - password [email or web address] [facility]  Office  [phone]  CC: Primary care physician; Kirk Ruths, MD

## 2020-04-29 ENCOUNTER — Other Ambulatory Visit: Payer: Self-pay | Admitting: Internal Medicine

## 2020-04-29 DIAGNOSIS — Z1231 Encounter for screening mammogram for malignant neoplasm of breast: Secondary | ICD-10-CM

## 2020-05-19 ENCOUNTER — Ambulatory Visit
Admission: RE | Admit: 2020-05-19 | Discharge: 2020-05-19 | Disposition: A | Discharge status: Home / Self Care | Payer: Medicare HMO | Source: Ambulatory Visit | Attending: Internal Medicine | Admitting: Internal Medicine

## 2020-05-19 DIAGNOSIS — Z1231 Encounter for screening mammogram for malignant neoplasm of breast: Secondary | ICD-10-CM | POA: Insufficient documentation

## 2020-09-09 ENCOUNTER — Other Ambulatory Visit: Payer: Self-pay

## 2020-09-09 ENCOUNTER — Other Ambulatory Visit
Admission: RE | Admit: 2020-09-09 | Discharge: 2020-09-09 | Disposition: A | Discharge status: Home / Self Care | Payer: Medicare HMO | Source: Ambulatory Visit | Attending: Gastroenterology | Admitting: Gastroenterology

## 2020-09-09 DIAGNOSIS — Z20822 Contact with and (suspected) exposure to covid-19: Secondary | ICD-10-CM | POA: Diagnosis not present

## 2020-09-09 DIAGNOSIS — Z01818 Encounter for other preprocedural examination: Secondary | ICD-10-CM | POA: Insufficient documentation

## 2020-09-09 LAB — SARS CORONAVIRUS 2 (TAT 6-24 HRS): SARS Coronavirus 2: NEGATIVE

## 2020-09-10 ENCOUNTER — Encounter: Payer: Self-pay | Admitting: *Deleted

## 2020-09-13 ENCOUNTER — Other Ambulatory Visit: Payer: Self-pay

## 2020-09-13 ENCOUNTER — Ambulatory Visit: Payer: Medicare HMO | Admitting: Anesthesiology

## 2020-09-13 ENCOUNTER — Ambulatory Visit
Admission: RE | Admit: 2020-09-13 | Discharge: 2020-09-13 | Disposition: A | Discharge status: Home / Self Care | Payer: Medicare HMO | Attending: Gastroenterology | Admitting: Gastroenterology

## 2020-09-13 ENCOUNTER — Encounter: Payer: Self-pay | Admitting: Anesthesiology

## 2020-09-13 ENCOUNTER — Encounter
Admission: RE | Disposition: A | Discharge status: Home / Self Care | Payer: Self-pay | Source: Home / Self Care | Attending: Gastroenterology

## 2020-09-13 DIAGNOSIS — F32A Depression, unspecified: Secondary | ICD-10-CM | POA: Insufficient documentation

## 2020-09-13 DIAGNOSIS — Z09 Encounter for follow-up examination after completed treatment for conditions other than malignant neoplasm: Secondary | ICD-10-CM | POA: Insufficient documentation

## 2020-09-13 DIAGNOSIS — Z85038 Personal history of other malignant neoplasm of large intestine: Secondary | ICD-10-CM | POA: Diagnosis not present

## 2020-09-13 DIAGNOSIS — K6389 Other specified diseases of intestine: Secondary | ICD-10-CM | POA: Insufficient documentation

## 2020-09-13 DIAGNOSIS — Z86010 Personal history of colonic polyps: Secondary | ICD-10-CM | POA: Insufficient documentation

## 2020-09-13 DIAGNOSIS — Z79899 Other long term (current) drug therapy: Secondary | ICD-10-CM | POA: Insufficient documentation

## 2020-09-13 DIAGNOSIS — K5730 Diverticulosis of large intestine without perforation or abscess without bleeding: Secondary | ICD-10-CM | POA: Insufficient documentation

## 2020-09-13 DIAGNOSIS — K2950 Unspecified chronic gastritis without bleeding: Secondary | ICD-10-CM | POA: Insufficient documentation

## 2020-09-13 DIAGNOSIS — Z85828 Personal history of other malignant neoplasm of skin: Secondary | ICD-10-CM | POA: Insufficient documentation

## 2020-09-13 DIAGNOSIS — R1310 Dysphagia, unspecified: Secondary | ICD-10-CM | POA: Insufficient documentation

## 2020-09-13 DIAGNOSIS — N189 Chronic kidney disease, unspecified: Secondary | ICD-10-CM | POA: Diagnosis not present

## 2020-09-13 DIAGNOSIS — D120 Benign neoplasm of cecum: Secondary | ICD-10-CM | POA: Diagnosis not present

## 2020-09-13 DIAGNOSIS — I480 Paroxysmal atrial fibrillation: Secondary | ICD-10-CM | POA: Insufficient documentation

## 2020-09-13 DIAGNOSIS — Z79890 Hormone replacement therapy (postmenopausal): Secondary | ICD-10-CM | POA: Diagnosis not present

## 2020-09-13 DIAGNOSIS — Z7901 Long term (current) use of anticoagulants: Secondary | ICD-10-CM | POA: Diagnosis not present

## 2020-09-13 DIAGNOSIS — K921 Melena: Secondary | ICD-10-CM | POA: Diagnosis not present

## 2020-09-13 DIAGNOSIS — I129 Hypertensive chronic kidney disease with stage 1 through stage 4 chronic kidney disease, or unspecified chronic kidney disease: Secondary | ICD-10-CM | POA: Diagnosis not present

## 2020-09-13 HISTORY — PX: ESOPHAGOGASTRODUODENOSCOPY (EGD) WITH PROPOFOL: SHX5813

## 2020-09-13 HISTORY — PX: COLONOSCOPY WITH PROPOFOL: SHX5780

## 2020-09-13 HISTORY — DX: Chronic kidney disease, unspecified: N18.9

## 2020-09-13 HISTORY — DX: Depression, unspecified: F32.A

## 2020-09-13 HISTORY — DX: Unspecified atrial fibrillation: I48.91

## 2020-09-13 SURGERY — COLONOSCOPY WITH PROPOFOL
Anesthesia: General

## 2020-09-13 MED ORDER — LIDOCAINE HCL (PF) 2 % IJ SOLN
INTRAMUSCULAR | Status: AC
  Filled 2020-09-13: qty 5

## 2020-09-13 MED ORDER — PROPOFOL 10 MG/ML IV BOLUS
INTRAVENOUS | Status: AC
  Filled 2020-09-13: qty 20

## 2020-09-13 MED ORDER — PROPOFOL 500 MG/50ML IV EMUL
INTRAVENOUS | Status: AC
  Filled 2020-09-13: qty 50

## 2020-09-13 MED ORDER — SODIUM CHLORIDE 0.9 % IV SOLN
INTRAVENOUS | Status: DC
  Administered 2020-09-13: 1000 mL via INTRAVENOUS

## 2020-09-13 MED ORDER — PROPOFOL 500 MG/50ML IV EMUL
INTRAVENOUS | Status: DC | PRN
  Administered 2020-09-13: 125 ug/kg/min via INTRAVENOUS

## 2020-09-13 NOTE — Op Note (Signed)
[facility] Gastroenterology Patient Name: Caroline Zamora Procedure Date: 09/13/2020 8:26 AM MRN: [id number] Account #: [id number] Date of Birth: [birth year] Admit Type: Outpatient Age: 79 Room: [facility] ENDO ROOM 1 Gender: Female Note Status: Finalized Procedure:             Upper GI endoscopy Indications:           Dysphagia, Melena Providers:             Andrey Farmer MD, MD Medicines:             Monitored Anesthesia Care Complications:         No immediate complications. Estimated blood loss:                         Minimal. Procedure:             Pre-Anesthesia Assessment:                        - Prior to the procedure, a History and Physical was                         performed, and patient medications and allergies were                         reviewed. The patient is competent. The risks and                         benefits of the procedure and the sedation options and                         risks were discussed with the patient. All questions                         were answered and informed consent was obtained.                         Patient identification and proposed procedure were                         verified by the physician, the nurse, the anesthetist                         and the technician in the endoscopy suite. Mental                         Status Examination: alert and oriented. Airway                         Examination: normal oropharyngeal airway and neck                         mobility. Respiratory Examination: clear to                         auscultation. CV Examination: normal. Prophylactic                         Antibiotics: The patient does not require prophylactic  antibiotics. Prior Anticoagulants: The patient has                         taken no previous anticoagulant or antiplatelet                         agents. ASA Grade Assessment: III - A patient with                         severe  systemic disease. After reviewing the risks and                         benefits, the patient was deemed in satisfactory                         condition to undergo the procedure. The anesthesia                         plan was to use monitored anesthesia care (MAC).                         Immediately prior to administration of medications,                         the patient was re-assessed for adequacy to receive                         sedatives. The heart rate, respiratory rate, oxygen                         saturations, blood pressure, adequacy of pulmonary                         ventilation, and response to care were monitored                         throughout the procedure. The physical status of the                         patient was re-assessed after the procedure.                        After obtaining informed consent, the endoscope was                         passed under direct vision. Throughout the procedure,                         the patient's blood pressure, pulse, and oxygen                         saturations were monitored continuously. The Endoscope                         was introduced through the mouth, and advanced to the                         second part of duodenum. The upper GI endoscopy was  accomplished without difficulty. The patient tolerated                         the procedure well. Findings:      The examined esophagus was normal.      A few dispersed diminutive erosions with no stigmata of recent bleeding       were found in the gastric antrum. Biopsies were taken with a cold       forceps for Helicobacter pylori testing. Estimated blood loss was       minimal.      The exam of the stomach was otherwise normal.      The examined duodenum was normal. Impression:            - Normal esophagus.                        - Erosive gastropathy with no stigmata of recent                         bleeding. Biopsied.                         - Normal examined duodenum. Recommendation:        - Discharge patient to home.                        - Resume previous diet.                        - Continue present medications.                        - Await pathology results.                        - Return to referring physician as previously                         scheduled. Procedure Code(s):     --- Professional ---                        [phone], Esophagogastroduodenoscopy, flexible,                         transoral; with biopsy, single or multiple Diagnosis Code(s):     --- Professional ---                        K31.89, Other diseases of stomach and duodenum                        R13.10, Dysphagia, unspecified                        K92.1, Melena (includes Hematochezia) CPT copyright 2019 American Medical Association. All rights reserved. The codes documented in this report are preliminary and upon coder review may  be revised to meet current compliance requirements. Andrey Farmer, MD Andrey Farmer MD, MD 09/13/2020 9:20:05 AM Number of Addenda: 0 Note Initiated On: 09/13/2020 8:26 AM Estimated Blood Loss:  Estimated blood loss was minimal.      [facility]

## 2020-09-13 NOTE — Anesthesia Procedure Notes (Signed)
Performed by: Vaughan Sine Pre-anesthesia Checklist: Patient identified, Emergency Drugs available, Suction available and Patient being monitored Patient Re-evaluated:Patient Re-evaluated prior to induction Oxygen Delivery Method: Nasal cannula Preoxygenation: Pre-oxygenation with 100% oxygen Induction Type: IV induction Airway Equipment and Method: Bite block Placement Confirmation: CO2 detector and positive ETCO2

## 2020-09-13 NOTE — Anesthesia Preprocedure Evaluation (Signed)
Anesthesia Evaluation  Patient identified by MRN, date of birth, ID band Patient awake    Reviewed: Allergy & Precautions, H&P , NPO status , Patient's Chart, lab work & pertinent test results, reviewed documented beta blocker date and time   Airway Mallampati: II   Neck ROM: full    Dental  (+) Poor Dentition   Pulmonary neg pulmonary ROS,    Pulmonary exam normal        Cardiovascular Exercise Tolerance: Poor hypertension, On Medications negative cardio ROS Normal cardiovascular exam Rhythm:regular Rate:Normal     Neuro/Psych  Headaches, PSYCHIATRIC DISORDERS Depression    GI/Hepatic negative GI ROS, Neg liver ROS,   Endo/Other  negative endocrine ROS  Renal/GU Renal disease  negative genitourinary   Musculoskeletal   Abdominal   Peds  Hematology negative hematology ROS (+)   Anesthesia Other Findings Past Medical History: No date: AF (atrial fibrillation) (HCC) No date: Arthritis     Comment:  lumbar spine No date: Chronic kidney disease No date: Colon cancer ([place]) No date: Depression No date: Dysphagia No date: Headache     Comment:  migraine No date: Hx of adenomatous colonic polyps No date: Hyperlipidemia No date: Hypertension No date: Paroxysmal atrial fibrillation (HCC) No date: Skin cancer Past Surgical History: No date: ABDOMINAL HYSTERECTOMY     Comment:  with removal of tubes and ovaries No date: COLONOSCOPY 09/03/2017: COLONOSCOPY WITH PROPOFOL; N/A     Comment:  Procedure: COLONOSCOPY WITH PROPOFOL;  Surgeon: Manya Silvas, MD;  Location: [facility] ENDOSCOPY;  Service:               Endoscopy;  Laterality: N/A; No date: DILATION AND CURETTAGE OF UTERUS No date: ESOPHAGOGASTRODUODENOSCOPY No date: skin cancer removal No date: TONSILLECTOMY No date: TUBAL LIGATION BMI    Body Mass Index: 28.25 kg/m     Reproductive/Obstetrics negative OB ROS                              Anesthesia Physical Anesthesia Plan  ASA: III  Anesthesia Plan: General   Post-op Pain Management:    Induction:   PONV Risk Score and Plan:   Airway Management Planned:   Additional Equipment:   Intra-op Plan:   Post-operative Plan:   Informed Consent: I have reviewed the patients History and Physical, chart, labs and discussed the procedure including the risks, benefits and alternatives for the proposed anesthesia with the patient or authorized representative who has indicated his/her understanding and acceptance.     Dental Advisory Given  Plan Discussed with: CRNA  Anesthesia Plan Comments:         Anesthesia Quick Evaluation

## 2020-09-13 NOTE — Op Note (Signed)
[facility] Gastroenterology Patient Name: Caroline Zamora Procedure Date: 09/13/2020 8:25 AM MRN: [id number] Account #: [id number] Date of Birth: [birth year] Admit Type: Outpatient Age: 79 Room: [facility] ENDO ROOM 1 Gender: Female Note Status: Finalized Procedure:             Colonoscopy Indications:           High risk colon cancer surveillance: Personal history                         of colon cancer Providers:             Andrey Farmer MD, MD Medicines:             Monitored Anesthesia Care Complications:         No immediate complications. Estimated blood loss:                         Minimal. Procedure:             Pre-Anesthesia Assessment:                        - Prior to the procedure, a History and Physical was                         performed, and patient medications and allergies were                         reviewed. The patient is competent. The risks and                         benefits of the procedure and the sedation options and                         risks were discussed with the patient. All questions                         were answered and informed consent was obtained.                         Patient identification and proposed procedure were                         verified by the physician, the nurse, the anesthetist                         and the technician in the endoscopy suite. Mental                         Status Examination: alert and oriented. Airway                         Examination: normal oropharyngeal airway and neck                         mobility. Respiratory Examination: clear to                         auscultation. CV Examination: normal. Prophylactic  Antibiotics: The patient does not require prophylactic                         antibiotics. Prior Anticoagulants: The patient has                         taken Eliquis (apixaban), last dose was 5 days prior                         to procedure.  ASA Grade Assessment: III - A patient                         with severe systemic disease. After reviewing the                         risks and benefits, the patient was deemed in                         satisfactory condition to undergo the procedure. The                         anesthesia plan was to use monitored anesthesia care                         (MAC). Immediately prior to administration of                         medications, the patient was re-assessed for adequacy                         to receive sedatives. The heart rate, respiratory                         rate, oxygen saturations, blood pressure, adequacy of                         pulmonary ventilation, and response to care were                         monitored throughout the procedure. The physical                         status of the patient was re-assessed after the                         procedure.                        After obtaining informed consent, the colonoscope was                         passed under direct vision. Throughout the procedure,                         the patient's blood pressure, pulse, and oxygen                         saturations were monitored continuously. The  Colonoscope was introduced through the anus and                         advanced to the the cecum, identified by appendiceal                         orifice and ileocecal valve. The colonoscopy was                         performed without difficulty. The patient tolerated                         the procedure well. The quality of the bowel                         preparation was adequate to identify polyps. Findings:      The perianal and digital rectal examinations were normal.      A 4 mm polyp was found in the appendiceal orifice. The polyp was       sessile. The polyp was removed with a cold snare. Resection and       retrieval were complete. Estimated blood loss was minimal.      A 1 mm polyp was  found in the appendiceal orifice. The polyp was       sessile. The polyp was removed with a jumbo cold forceps. Resection and       retrieval were complete. Estimated blood loss was minimal.      Many small-mouthed diverticula were found in the sigmoid colon.      The exam was otherwise without abnormality on direct and retroflexion       views. Impression:            - One 4 mm polyp at the appendiceal orifice, removed                         with a cold snare. Resected and retrieved.                        - One 1 mm polyp at the appendiceal orifice, removed                         with a jumbo cold forceps. Resected and retrieved.                        - Diverticulosis in the sigmoid colon.                        - The examination was otherwise normal on direct and                         retroflexion views. Recommendation:        - Discharge patient to home.                        - Resume previous diet.                        - Continue present medications.                        -  Await pathology results.                        - Repeat colonoscopy in 5 years for surveillance.                        - Resume Eliquis (apixaban) at prior dose tomorrow. Procedure Code(s):     --- Professional ---                        [id number], Colonoscopy, flexible; with removal of                         tumor(s), polyp(s), or other lesion(s) by snare                         technique                        [id number], 68, Colonoscopy, flexible; with biopsy, single                         or multiple Diagnosis Code(s):     --- Professional ---                        M84.132, Personal history of other malignant neoplasm                         of large intestine                        K63.5, Polyp of colon                        K57.30, Diverticulosis of large intestine without                         perforation or abscess without bleeding CPT copyright 2019 American Medical Association. All rights  reserved. The codes documented in this report are preliminary and upon coder review may  be revised to meet current compliance requirements. Andrey Farmer, MD Andrey Farmer MD, MD 09/13/2020 9:24:37 AM Number of Addenda: 0 Note Initiated On: 09/13/2020 8:25 AM Scope Withdrawal Time: 0 hours 16 minutes 34 seconds  Total Procedure Duration: 0 hours 25 minutes 35 seconds  Estimated Blood Loss:  Estimated blood loss was minimal.      [facility]

## 2020-09-13 NOTE — Interval H&P Note (Signed)
History and Physical Interval Note:  09/13/2020 8:33 AM  Caroline Zamora  has presented today for surgery, with the diagnosis of MELENA ESOPH DYSPHAGIA.  The various methods of treatment have been discussed with the patient and family. After consideration of risks, benefits and other options for treatment, the patient has consented to  Procedure(s): COLONOSCOPY WITH PROPOFOL (N/A) ESOPHAGOGASTRODUODENOSCOPY (EGD) WITH PROPOFOL (N/A) as a surgical intervention.  The patient's history has been reviewed, patient examined, no change in status, stable for surgery.  I have reviewed the patient's chart and labs.  Questions were answered to the patient's satisfaction.     Lesly Rubenstein  Ok to proceed with EGD/Colonoscopy

## 2020-09-13 NOTE — H&P (Signed)
Outpatient short stay form Pre-procedure 09/13/2020 8:31 AM Caroline Miyamoto MD, MPH  Primary Physician: Dr. 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 Last colonoscopy in 2018 with 3 small TA's. No family history of GI malignancies. She takes eliquis with last dose 5 days ago. History of hysterectomy. EGD for rare dysphagia and occasional black stools. Normal CBC.    Current Facility-Administered Medications:  .  0.9 %  sodium chloride infusion, , Intravenous, Continuous, Macayla Ekdahl, Hilton Cork, MD, Last Rate: 20 mL/hr at 09/13/20 0165, Continued from Pre-op at 09/13/20 5374  Medications Prior to Admission  Medication Sig Dispense Refill Last Dose  . carvedilol (COREG) 12.5 MG tablet Take 12.5 mg by mouth 2 (two) times daily with a meal.   09/13/2020 at Unknown time  . diltiazem (CARDIZEM CD) 240 MG 24 hr capsule Take 1 capsule (240 mg total) by mouth daily. 30 capsule 0 09/13/2020 at Unknown time  . escitalopram (LEXAPRO) 10 MG tablet Take 10 mg by mouth daily.   09/13/2020 at Unknown time  . estradiol (ESTRACE) 0.5 MG tablet Take 0.5 mg by mouth daily.   09/12/2020 at Unknown time  . LORazepam (ATIVAN) 1 MG tablet Take 1 tablet (1 mg total) by mouth every 6 (six) hours as needed for anxiety. 30 tablet 0 09/12/2020 at Unknown time  . mometasone (NASONEX) 50 MCG/ACT nasal spray Place 2 sprays into the nose daily as needed.    Past Week at Unknown time  . apixaban (ELIQUIS) 5 MG TABS tablet Take 5 mg by mouth 2 (two) times daily.   09/07/2020 at 0800     Allergies  Allergen Reactions  . Desyrel [Trazodone] Rash  . Sulfa Antibiotics Rash     Past Medical History:  Diagnosis Date  . AF (atrial fibrillation) ([place])   . Arthritis    lumbar spine  . Chronic kidney disease   . Colon cancer ([place])   . Depression   . Dysphagia   . Headache     migraine  . Hx of adenomatous colonic polyps   . Hyperlipidemia   . Hypertension   . Paroxysmal atrial fibrillation ([facility])   . Skin cancer     Review of systems:  Otherwise negative.    Physical Exam  Gen: Alert, oriented. Appears stated age.  HEENT: [place]/AT. PERRLA. Lungs: No respiratory distress Abd: soft, benign, no masses. Ext: No edema. Pulses 2+    Planned procedures: Proceed with EGD/colonoscopy. The patient understands the nature of the planned procedure, indications, risks, alternatives and potential complications including but not limited to bleeding, infection, perforation, damage to internal organs and possible oversedation/side effects from anesthesia. The patient agrees and gives consent to proceed.  Please refer to procedure notes for findings, recommendations and patient disposition/instructions.     Caroline Miyamoto MD, MPH Gastroenterology 09/13/2020  8:31 AM

## 2020-09-13 NOTE — Transfer of Care (Signed)
Immediate Anesthesia Transfer of Care Note  Patient: Caroline Zamora  Procedure(s) Performed: COLONOSCOPY WITH PROPOFOL (N/A ) ESOPHAGOGASTRODUODENOSCOPY (EGD) WITH PROPOFOL (N/A )  Patient Location: PACU  Anesthesia Type:General  Level of Consciousness: awake and sedated  Airway & Oxygen Therapy: Patient Spontanous Breathing and Patient connected to nasal cannula oxygen  Post-op Assessment: Report given to RN and Post -op Vital signs reviewed and stable  Post vital signs: Reviewed and stable  Last Vitals:  Vitals Value Taken Time  BP    Temp    Pulse    Resp    SpO2      Last Pain:  Vitals:   09/13/20 0757  TempSrc: Temporal  PainSc: 0-No pain         Complications: No complications documented.

## 2020-09-14 ENCOUNTER — Encounter: Payer: Self-pay | Admitting: Gastroenterology

## 2020-09-14 LAB — SURGICAL PATHOLOGY

## 2020-09-14 NOTE — Anesthesia Postprocedure Evaluation (Signed)
Anesthesia Post Note  Patient: TIENNA BIENKOWSKI  Procedure(s) Performed: COLONOSCOPY WITH PROPOFOL (N/A ) ESOPHAGOGASTRODUODENOSCOPY (EGD) WITH PROPOFOL (N/A )  Patient location during evaluation: PACU Anesthesia Type: General Level of consciousness: awake and alert Pain management: pain level controlled Vital Signs Assessment: post-procedure vital signs reviewed and stable Respiratory status: spontaneous breathing, nonlabored ventilation, respiratory function stable and patient connected to nasal cannula oxygen Cardiovascular status: blood pressure returned to baseline and stable Postop Assessment: no apparent nausea or vomiting Anesthetic complications: no   No complications documented.   Last Vitals:  Vitals:   09/13/20 0938 09/13/20 0948  BP: 119/77 123/71  Pulse: (!) 101 (!) 108  Resp: 12 17  Temp:    SpO2: 95% 98%    Last Pain:  Vitals:   09/13/20 0948  TempSrc:   PainSc: 0-No pain                 Molli Barrows

## 2021-04-20 ENCOUNTER — Other Ambulatory Visit: Payer: Self-pay | Admitting: Internal Medicine

## 2021-04-20 DIAGNOSIS — Z1231 Encounter for screening mammogram for malignant neoplasm of breast: Secondary | ICD-10-CM

## 2021-05-26 ENCOUNTER — Ambulatory Visit
Admission: RE | Admit: 2021-05-26 | Discharge: 2021-05-26 | Disposition: A | Discharge status: Home / Self Care | Payer: Medicare HMO | Source: Ambulatory Visit | Attending: Internal Medicine | Admitting: Internal Medicine

## 2021-05-26 ENCOUNTER — Other Ambulatory Visit: Payer: Self-pay

## 2021-05-26 DIAGNOSIS — Z1231 Encounter for screening mammogram for malignant neoplasm of breast: Secondary | ICD-10-CM | POA: Insufficient documentation

## 2022-02-24 ENCOUNTER — Ambulatory Visit: Payer: Medicare HMO | Admitting: Cardiology

## 2022-03-22 NOTE — Progress Notes (Signed)
? ? ? ? ?ELECTROPHYSIOLOGY CONSULT NOTE  ?Patient ID: Caroline Zamora, MRN: [id number], DOB/AGE: 1941-07-28 81 y.o. ?Admit date: (Not on file) ?Date of Consult: 03/23/2022 ? ?Primary Physician: Kirk Ruths, MD ?Primary Cardiologist:  [facility] ?  ?  ?Caroline Zamora is a 81 y.o. female who is being seen today for the evaluation of atrial fib at the request of MA.  ? ? ?HPI ?Caroline Zamora is a 81 y.o. female referred for thoughts about atrial fibrillation as well as more impressively syncope. ? ?Over the last 3 years has had a dozen or so episodes of abrupt onset offset syncope, duration of these events is typically 5-10 seconds and she is able to get herself off the floor without difficulty.  She has had a number of fractures.  More recently she has had episodes with a little bit of warning enough to sit down and then the spells are gone within 5 seconds. ? ?Admitted 7/20 the first of these episodes and was found to be in atrial fibrillation with a rapid rate.  She was anticoagulated with apixaban and has had no significant bleeding ? ?ECG at that time showed multiple PVCs left bundle inferior axis ? ?Over recent months she has noted significant worsening in exercise tolerance manifested by dyspnea on exertion without peripheral edema.  She has some orthopnea, no chest pain ? ?DATE TEST EF   ?8/20 Echo   55-60 %   ?1/23 Echo   35 % LAE with that  ?1/23 MYOVIEW 59% Normal perfusion  ? ?Date Cr K Hgb LDL  ?9/21   14.5   ?3/23 0.9 4.2   130  ? ?Thromboembolic risk factors ( age  -2, HTN-1, CHF-1 , Gender-1) for a CHADSVASc Score of >=4 ? ? ? ?Past Medical History:  ?Diagnosis Date  ? AF (atrial fibrillation) ([place])   ? 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 Chronic kidney disease   ? Colon cancer ([place])   ? Depression   ? Dysphagia   ? Headache   ? migraine  ? Hx of adenomatous colonic polyps   ? Hyperlipidemia   ? Hypertension   ? Paroxysmal atrial fibrillation (H[facility])   ? Skin cancer   ?   ? ?Surgical History:  ?Past  Surgical History:  ?Procedure Laterality Date  ? ABDOMINAL HYSTERECTOMY    ? with removal of tubes and ovaries  ? COLONOSCOPY    ? COLONOSCOPY WITH PROPOFOL N/A 09/03/2017  ? Procedure: COLONOSCOPY WITH PROPOFOL;  Surgeon: Manya Silvas, MD;  [facility] ENDOSCOPY;  Service: Endoscopy;  Laterality: N/A;  ? COLONOSCOPY WITH PROPOFOL N/A 09/13/2020  ? Procedure: COLONOSCOPY WITH PROPOFOL;  Surgeon: Lesly Rubenstein, MD;  Location[facility] ENDOSCOPY;  Service: Endoscopy;  Laterality: N/A;  ? DILATION AND CURETTAGE OF UTERUS    ? ESOPHAGOGASTRODUODENOSCOPY    ? ESOPHAGOGASTRODUODENOSCOPY (EGD) WITH PROPOFOL N/A 09/13/2020  ? Procedure: ESOPHAGOGASTRODUODENOSCOPY (EGD) WITH PROPOFOL;  Surgeon: Lesly Rubenstein, MD;  Location: [facility] ENDOSCOPY;  Service: Endoscopy;  Laterality: N/A;  ? skin cancer removal    ? TONSILLECTOMY    ? TUBAL LIGATION    ?  ? ?Home Meds: ?Current Meds  ?Medication Sig  ? acetaminophen (TYLENOL) 500 MG tablet Acetaminophen Extra Strength 500 mg tablet ? Take 1 tablet every 4 hours by oral route.  ? apixaban (ELIQUIS) 5 MG TABS tablet Take 5 mg by mouth 2 (two) times daily.  ? carvedilol (COREG) 25 MG tablet Take 25 mg by  mouth 2 (two) times daily.  ? diltiazem (CARDIZEM CD) 240 MG 24 hr capsule Take 1 capsule (240 mg total) by mouth daily.  ? estradiol (ESTRACE) 0.5 MG tablet Take 0.5 mg by mouth daily.  ? furosemide (LASIX) 20 MG tablet Take 1 tablet (20 mg) by mouth once a day x 3 days as directed  ? hydrOXYzine (ATARAX) 25 MG tablet Take 25 mg by mouth 2 (two) times daily as needed.  ? LORazepam (ATIVAN) 1 MG tablet Take 1 tablet (1 mg total) by mouth every 6 (six) hours as needed for anxiety.  ? mometasone (NASONEX) 50 MCG/ACT nasal spray Place 2 sprays into the nose daily as needed.   ? venlafaxine XR (EFFEXOR-XR) 150 MG 24 hr capsule Take 150 mg by mouth daily.  ? ? ?Allergies:  ?Allergies  ?Allergen Reactions  ? Desyrel [Trazodone] Rash  ? Sulfa Antibiotics Rash  ? ? ?Social History   ? ?Socioeconomic History  ? Marital status: Widowed  ?  Spouse name: Not on file  ? Number of children: Not on file  ? Years of education: Not on file  ? Highest education level: Not on file  ?Occupational History  ? Not on file  ?Tobacco Use  ? Smoking status: Never  ? Smokeless tobacco: Never  ?Vaping Use  ? Vaping Use: Never used  ?Substance and Sexual Activity  ? Alcohol use: Yes  ? Drug use: No  ? Sexual activity: Not on file  ?Other Topics Concern  ? Not on file  ?Social History Narrative  ? Not on file  ? ?Social Determinants of Health  ? ?Financial Resource Strain: Not on file  ?Food Insecurity: Not on file  ?Transportation Needs: Not on file  ?Physical Activity: Not on file  ?Stress: Not on file  ?Social Connections: Not on file  ?Intimate Partner Violence: Not on file  ?  ? ?Family History  ?Problem Relation Age of Onset  ? Breast cancer Neg Hx   ?  ? ?ROS:  Please see the history of present illness.     All other systems reviewed and negative.  ? ? ?Physical Exam: ?Blood pressure 102/70, pulse 68, height '5\' 6"'$  (1.676 m), weight 177 lb (80.3 kg), SpO2 96 %. ?General: Well developed, well nourished female in no acute distress. ?Head: Normocephalic, atraumatic, sclera non-icteric, no xanthomas, nares are without discharge. ?EENT: normal  ?Lymph Nodes:  none ?Neck: Negative for carotid bruits. JVD 6-8 ?Back:without scoliosis kyphosis ?Lungs: Clear bilaterally to auscultation without wheezes, rales, or rhonchi. Breathing is unlabored. ?Heart: RRR with S1 S2. N murmur . No rubs, or gallops appreciated. ?Abdomen: Soft, non-tender, non-distended with normoactive bowel sounds. No hepatomegaly. No rebound/guarding. No obvious abdominal masses. ?Msk:  Strength and tone appear normal for age. ?Extremities: No clubbing or cyanosis. No  edema.  Distal pedal pulses are 2+ and equal bilaterally. ?Skin: Warm and Dry ?Neuro: Alert and oriented X 3. CN III-XII intact Grossly normal sensory and motor function . ?Psych:   Responds to questions appropriately with a normal affect. ?  ?  ?  ? ?EKG: Sinus at 68 ?Interval 18/12/46 ?Incomplete right bundle branch block ?PVCs left bundle inferior axis transition V2-V3 ? ? ?Assessment and Plan:  ? ?Syncope abrupt in onset/offset ? ?Atrial fibrillation ? ?PVCs-left bundle inferior axis ? ?Dyspnea on exertion ? ?Cardiomyopathy? ? ?Incomplete right bundle branch block ? ?3-year history of recurrent episodes of abrupt onset/offset syncope worrisome for arrhythmic syncope given the brevity of symptoms and the lack  of residual orthostatic intolerance.  With her QRS greater than 100 ms, data would suggest that there is a greater than 50% chance or so of arrhythmic syncope.  Hence, a loop recorder would be appropriate if her heart function were normal. ? ?Her recent episodes of presyncope are enlightening to and that they are very brief been only seconds.  This is again consistent arrhythmic syncope ? ?There is discordant information as to her left ventricular function; the echo had 30-35% the Myoview was read as normal.  Typically the echo is the more accurate.  We will have to repeat this.  In the event that she has significant LV dysfunction, which is supported by her recent complaints of syncope, then ICD implantation empirically would be a reasonable consideration.  If we were to pursue that, we would undertake a cMRI prior to looking for evidence of structural disease. ? ?Her cardiomyopathy has a number of potential explanations, but 1 that which looms are the PVCs which have been identified on her ECGs back through epic.  They are left bundle inferior axis consistent with an origin of the RVOT region, there is a small R wave in aVL suggesting that it is not exactly in the summit.  She recently wore a monitor for [facility], had none of her syncopal or presyncopal spells with it, but it should have Retail banker quantitate her PVCs ? ? ? ? ? ?Virl Axe  ?

## 2022-03-23 ENCOUNTER — Encounter: Payer: Self-pay | Admitting: Internal Medicine

## 2022-03-23 ENCOUNTER — Ambulatory Visit: Payer: Medicare HMO | Admitting: Internal Medicine

## 2022-03-23 ENCOUNTER — Encounter: Payer: Self-pay | Admitting: *Deleted

## 2022-03-23 VITALS — BP 102/70 | HR 68 | Ht 66.0 in | Wt 177.0 lb

## 2022-03-23 DIAGNOSIS — I4891 Unspecified atrial fibrillation: Secondary | ICD-10-CM

## 2022-03-23 DIAGNOSIS — R55 Syncope and collapse: Secondary | ICD-10-CM

## 2022-03-23 MED ORDER — FUROSEMIDE 20 MG PO TABS: ORAL_TABLET | ORAL | 0 refills | Status: DC

## 2022-03-23 NOTE — Patient Instructions (Addendum)
No driving for the next 5 months due to episodes of passing out ? ?Decrease your daily alcohol intake ? ?Medication Instructions:  ?- Your physician has recommended you make the following change in your medication:  ? ?1) START lasix (furosemide) 20 mg: ?- take 1 tablet (20 mg) by mouth once a day x 3 days, then stop ? ?*If you need a refill on your cardiac medications before your next appointment, please call your pharmacy* ? ? ?Lab Work: ?- none ordered ? ?If you have labs (blood work) drawn today and your tests are completely normal, you will receive your results only by: ?MyChart Message (if you have MyChart) OR ?A paper copy in the mail ?If you have any lab test that is abnormal or we need to change your treatment, we will call you to review the results. ? ? ?Testing/Procedures: ? ?1) Echocardiogram: ?- Your physician has requested that you have an echocardiogram. Echocardiography is a painless test that uses sound waves to create images of your heart. It provides your doctor with information about the size and shape of your heart and how well your heart?s chambers and valves are working. This procedure takes approximately one hour. There are no restrictions for this procedure. There is a possibility that an IV may need to be started during your test to inject an image enhancing agent. This is done to obtain more optimal pictures of your heart. Therefore we ask that you do at least drink some water prior to coming in to hydrate your veins.  ? ? ?If the echocardiogram is abnormal, the plan will be to pursue a Cardiac MRI ?If the echocardiogram is normal, the plan will be to pursue an implantable loop recorder ? ? ? ? ?Follow-Up: ?At [facility], you and your health needs are our priority.  As part of our continuing mission to provide you with exceptional heart care, we have created designated Provider Care Teams.  These Care Teams include your primary Cardiologist (physician) and Advanced Practice Providers  (APPs -  Physician Assistants and Nurse Practitioners) who all work together to provide you with the care you need, when you need it. ? ?We recommend signing up for the patient portal called "MyChart".  Sign up information is provided on this After Visit Summary.  MyChart is used to connect with patients for Virtual Visits (Telemedicine).  Patients are able to view lab/test results, encounter notes, upcoming appointments, etc.  Non-urgent messages can be sent to your provider as well.   ?To learn more about what you can do with MyChart, go to [email or web address].   ? ?Your next appointment:   ?4-5 week(s) ? ?The format for your next appointment:   ?In Person ? ?Provider:   ?Virl Axe, MD  ? ? ?Other Instructions ? ?Echocardiogram ?An echocardiogram is a test that uses sound waves (ultrasound) to produce images of the heart. ?Images from an echocardiogram can provide important information about: ?Heart size and shape. ?The size and thickness and movement of your heart's walls. ?Heart muscle function and strength. ?Heart valve function or if you have stenosis. Stenosis is when the heart valves are too narrow. ?If blood is flowing backward through the heart valves (regurgitation). ?A tumor or infectious growth around the heart valves. ?Areas of heart muscle that are not working well because of poor blood flow or injury from a heart attack. ?Aneurysm detection. An aneurysm is a weak or damaged part of an artery wall. The wall bulges out from the normal force  of blood pumping through the body. ?Tell a health care provider about: ?Any allergies you have. ?All medicines you are taking, including vitamins, herbs, eye drops, creams, and over-the-counter medicines. ?Any blood disorders you have. ?Any surgeries you have had. ?Any medical conditions you have. ?Whether you are pregnant or may be pregnant. ?What are the risks? ?Generally, this is a safe test. However, problems may occur, including an allergic reaction to  dye (contrast) that may be used during the test. ?What happens before the test? ?No specific preparation is needed. You may eat and drink normally. ?What happens during the test? ? ?You will take off your clothes from the waist up and put on a hospital gown. ?Electrodes or electrocardiogram (ECG)patches may be placed on your chest. The electrodes or patches are then connected to a device that monitors your heart rate and rhythm. ?You will lie down on a table for an ultrasound exam. A gel will be applied to your chest to help sound waves pass through your skin. ?A handheld device, called a transducer, will be pressed against your chest and moved over your heart. The transducer produces sound waves that travel to your heart and bounce back (or "echo" back) to the transducer. These sound waves will be captured in real-time and changed into images of your heart that can be viewed on a video monitor. The images will be recorded on a computer and reviewed by your health care provider. ?You may be asked to change positions or hold your breath for a short time. This makes it easier to get different views or better views of your heart. ?In some cases, you may receive contrast through an IV in one of your veins. This can improve the quality of the pictures from your heart. ?The procedure may vary among health care providers and hospitals. ?What can I expect after the test? ?You may return to your normal, everyday life, including diet, activities, and medicines, unless your health care provider tells you not to do that. ?Follow these instructions at home: ?It is up to you to get the results of your test. Ask your health care provider, or the department that is doing the test, when your results will be ready. ?Keep all follow-up visits. This is important. ?Summary ?An echocardiogram is a test that uses sound waves (ultrasound) to produce images of the heart. ?Images from an echocardiogram can provide important information about  the size and shape of your heart, heart muscle function, heart valve function, and other possible heart problems. ?You do not need to do anything to prepare before this test. You may eat and drink normally. ?After the echocardiogram is completed, you may return to your normal, everyday life, unless your health care provider tells you not to do that. ?This information is not intended to replace advice given to you by your health care provider. Make sure you discuss any questions you have with your health care provider. ?Document Revised: 08/03/2021 Document Reviewed: 07/13/2020 ?Elsevier Patient Education ? [place]. ? ? ?Important Information About Sugar ? ? ? ? ? ? ?

## 2022-04-10 ENCOUNTER — Ambulatory Visit (INDEPENDENT_AMBULATORY_CARE_PROVIDER_SITE_OTHER): Payer: Medicare HMO

## 2022-04-10 DIAGNOSIS — R55 Syncope and collapse: Secondary | ICD-10-CM | POA: Diagnosis not present

## 2022-04-10 DIAGNOSIS — I4891 Unspecified atrial fibrillation: Secondary | ICD-10-CM

## 2022-04-10 LAB — ECHOCARDIOGRAM COMPLETE
AR max vel: 2.2 cm2
AV Area VTI: 2.37 cm2
AV Area mean vel: 2.26 cm2
AV Mean grad: 2 mmHg
AV Peak grad: 3.8 mmHg
Ao pk vel: 0.97 m/s
Area-P 1/2: 3.7 cm2
Calc EF: 53.9 %
S' Lateral: 3.4 cm
Single Plane A2C EF: 51.8 %
Single Plane A4C EF: 55.5 %

## 2022-04-10 MED ORDER — PERFLUTREN LIPID MICROSPHERE
1.0000 mL | INTRAVENOUS | Status: AC | PRN
  Administered 2022-04-10: 2 mL via INTRAVENOUS

## 2022-04-13 ENCOUNTER — Emergency Department: Payer: Medicare HMO

## 2022-04-13 ENCOUNTER — Inpatient Hospital Stay
Admission: EM | Admit: 2022-04-13 | Discharge: 2022-04-15 | DRG: 309 | Disposition: A | Discharge status: Other Acute Inpatient Hospital | Payer: Medicare HMO | Attending: Osteopathic Medicine | Admitting: Osteopathic Medicine

## 2022-04-13 ENCOUNTER — Other Ambulatory Visit: Payer: Self-pay

## 2022-04-13 DIAGNOSIS — Z9181 History of falling: Secondary | ICD-10-CM | POA: Diagnosis not present

## 2022-04-13 DIAGNOSIS — Z79890 Hormone replacement therapy (postmenopausal): Secondary | ICD-10-CM

## 2022-04-13 DIAGNOSIS — Z85828 Personal history of other malignant neoplasm of skin: Secondary | ICD-10-CM

## 2022-04-13 DIAGNOSIS — R42 Dizziness and giddiness: Secondary | ICD-10-CM | POA: Diagnosis present

## 2022-04-13 DIAGNOSIS — E785 Hyperlipidemia, unspecified: Secondary | ICD-10-CM | POA: Diagnosis present

## 2022-04-13 DIAGNOSIS — R296 Repeated falls: Secondary | ICD-10-CM | POA: Diagnosis present

## 2022-04-13 DIAGNOSIS — I4892 Unspecified atrial flutter: Secondary | ICD-10-CM | POA: Diagnosis present

## 2022-04-13 DIAGNOSIS — I4891 Unspecified atrial fibrillation: Principal | ICD-10-CM | POA: Diagnosis present

## 2022-04-13 DIAGNOSIS — R55 Syncope and collapse: Secondary | ICD-10-CM | POA: Diagnosis present

## 2022-04-13 DIAGNOSIS — I110 Hypertensive heart disease with heart failure: Secondary | ICD-10-CM | POA: Diagnosis present

## 2022-04-13 DIAGNOSIS — I480 Paroxysmal atrial fibrillation: Secondary | ICD-10-CM | POA: Diagnosis present

## 2022-04-13 DIAGNOSIS — G43909 Migraine, unspecified, not intractable, without status migrainosus: Secondary | ICD-10-CM | POA: Diagnosis present

## 2022-04-13 DIAGNOSIS — W19XXXA Unspecified fall, initial encounter: Secondary | ICD-10-CM | POA: Diagnosis present

## 2022-04-13 DIAGNOSIS — Z882 Allergy status to sulfonamides status: Secondary | ICD-10-CM | POA: Diagnosis not present

## 2022-04-13 DIAGNOSIS — Z79899 Other long term (current) drug therapy: Secondary | ICD-10-CM | POA: Diagnosis not present

## 2022-04-13 DIAGNOSIS — I5020 Unspecified systolic (congestive) heart failure: Secondary | ICD-10-CM

## 2022-04-13 DIAGNOSIS — F341 Dysthymic disorder: Secondary | ICD-10-CM | POA: Diagnosis not present

## 2022-04-13 DIAGNOSIS — M1990 Unspecified osteoarthritis, unspecified site: Secondary | ICD-10-CM | POA: Diagnosis present

## 2022-04-13 DIAGNOSIS — I5022 Chronic systolic (congestive) heart failure: Secondary | ICD-10-CM | POA: Diagnosis present

## 2022-04-13 DIAGNOSIS — Z86010 Personal history of colonic polyps: Secondary | ICD-10-CM | POA: Diagnosis not present

## 2022-04-13 DIAGNOSIS — Z8249 Family history of ischemic heart disease and other diseases of the circulatory system: Secondary | ICD-10-CM | POA: Diagnosis not present

## 2022-04-13 DIAGNOSIS — I429 Cardiomyopathy, unspecified: Secondary | ICD-10-CM | POA: Diagnosis present

## 2022-04-13 DIAGNOSIS — I4510 Unspecified right bundle-branch block: Secondary | ICD-10-CM | POA: Diagnosis present

## 2022-04-13 DIAGNOSIS — Z85038 Personal history of other malignant neoplasm of large intestine: Secondary | ICD-10-CM | POA: Diagnosis not present

## 2022-04-13 DIAGNOSIS — F32A Depression, unspecified: Secondary | ICD-10-CM | POA: Diagnosis present

## 2022-04-13 DIAGNOSIS — Z7901 Long term (current) use of anticoagulants: Secondary | ICD-10-CM | POA: Diagnosis not present

## 2022-04-13 DIAGNOSIS — I493 Ventricular premature depolarization: Secondary | ICD-10-CM | POA: Diagnosis not present

## 2022-04-13 DIAGNOSIS — Z888 Allergy status to other drugs, medicaments and biological substances status: Secondary | ICD-10-CM | POA: Diagnosis not present

## 2022-04-13 LAB — BASIC METABOLIC PANEL
Anion gap: 7 (ref 5–15)
BUN: 14 mg/dL (ref 8–23)
CO2: 23 mmol/L (ref 22–32)
Calcium: 7.9 mg/dL — ABNORMAL LOW (ref 8.9–10.3)
Chloride: 106 mmol/L (ref 98–111)
Creatinine, Ser: 0.82 mg/dL (ref 0.44–1.00)
GFR, Estimated: >60 mL/min (ref >60)
Glucose, Bld: 126 mg/dL — ABNORMAL HIGH (ref 70–99)
Potassium: 3.7 mmol/L (ref 3.5–5.1)
Sodium: 136 mmol/L (ref 135–145)

## 2022-04-13 LAB — CBC WITH DIFFERENTIAL/PLATELET
Abs Immature Granulocytes: 0.06 10*3/uL (ref 0.00–0.07)
Basophils Absolute: 0.1 10*3/uL (ref 0.0–0.1)
Basophils Relative: 1 %
Eosinophils Absolute: 0.1 10*3/uL (ref 0.0–0.5)
Eosinophils Relative: 1 %
HCT: 41.9 % (ref 36.0–46.0)
Hemoglobin: 13.8 g/dL (ref 12.0–15.0)
Immature Granulocytes: 1 %
Lymphocytes Relative: 18 %
Lymphs Abs: 1.8 10*3/uL (ref 0.7–4.0)
MCH: 27.6 pg (ref 26.0–34.0)
MCHC: 32.9 g/dL (ref 30.0–36.0)
MCV: 83.8 fL (ref 80.0–100.0)
Monocytes Absolute: 1 10*3/uL (ref 0.1–1.0)
Monocytes Relative: 10 %
Neutro Abs: 7.3 10*3/uL (ref 1.7–7.7)
Neutrophils Relative %: 69 %
Platelets: 326 10*3/uL (ref 150–400)
RBC: 5 MIL/uL (ref 3.87–5.11)
RDW: 13.9 % (ref 11.5–15.5)
WBC: 10.2 10*3/uL (ref 4.0–10.5)
nRBC: 0 % (ref 0.0–0.2)

## 2022-04-13 LAB — TROPONIN I (HIGH SENSITIVITY)
Troponin I (High Sensitivity): 3 ng/L (ref <18)
Troponin I (High Sensitivity): <2 ng/L (ref <18)

## 2022-04-13 MED ORDER — FUROSEMIDE 20 MG PO TABS
20.0000 mg | ORAL_TABLET | Freq: Every day | ORAL | Status: DC
  Administered 2022-04-14 – 2022-04-15 (×2): 20 mg via ORAL
  Filled 2022-04-13 (×2): qty 1

## 2022-04-13 MED ORDER — MAGNESIUM HYDROXIDE 400 MG/5ML PO SUSP: 30.0000 mL | Freq: Every day | ORAL | Status: DC | PRN

## 2022-04-13 MED ORDER — POTASSIUM CHLORIDE IN NACL 20-0.9 MEQ/L-% IV SOLN
INTRAVENOUS | Status: DC
  Filled 2022-04-13 (×4): qty 1000

## 2022-04-13 MED ORDER — ACETAMINOPHEN 650 MG RE SUPP: 650.0000 mg | Freq: Four times a day (QID) | RECTAL | Status: DC | PRN

## 2022-04-13 MED ORDER — ONDANSETRON HCL 4 MG/2ML IJ SOLN: 4.0000 mg | Freq: Four times a day (QID) | INTRAMUSCULAR | Status: DC | PRN

## 2022-04-13 MED ORDER — ONDANSETRON HCL 4 MG PO TABS: 4.0000 mg | ORAL_TABLET | Freq: Four times a day (QID) | ORAL | Status: DC | PRN

## 2022-04-13 MED ORDER — ACETAMINOPHEN 325 MG PO TABS
650.0000 mg | ORAL_TABLET | Freq: Four times a day (QID) | ORAL | Status: DC | PRN
  Administered 2022-04-15: 650 mg via ORAL
  Filled 2022-04-13: qty 2

## 2022-04-13 MED ORDER — SODIUM CHLORIDE 0.9 % IV BOLUS
1000.0000 mL | Freq: Once | INTRAVENOUS | Status: AC
  Administered 2022-04-13: 1000 mL via INTRAVENOUS

## 2022-04-13 MED ORDER — MELATONIN 5 MG PO TABS: 2.5000 mg | ORAL_TABLET | Freq: Every evening | ORAL | Status: DC | PRN

## 2022-04-13 MED ORDER — HYDROXYZINE HCL 25 MG PO TABS: 25.0000 mg | ORAL_TABLET | Freq: Two times a day (BID) | ORAL | Status: DC | PRN

## 2022-04-13 MED ORDER — LORAZEPAM 1 MG PO TABS
1.0000 mg | ORAL_TABLET | Freq: Four times a day (QID) | ORAL | Status: DC | PRN
  Administered 2022-04-15 (×3): 1 mg via ORAL
  Filled 2022-04-13 (×3): qty 1

## 2022-04-13 MED ORDER — VENLAFAXINE HCL ER 75 MG PO CP24
150.0000 mg | ORAL_CAPSULE | Freq: Every day | ORAL | Status: DC
Start: 2022-04-14 — End: 2022-04-15
  Administered 2022-04-14 – 2022-04-15 (×2): 150 mg via ORAL
  Filled 2022-04-13: qty 1
  Filled 2022-04-13: qty 2

## 2022-04-13 MED ORDER — ESTRADIOL 1 MG PO TABS
0.5000 mg | ORAL_TABLET | Freq: Every day | ORAL | Status: DC
  Administered 2022-04-14 – 2022-04-15 (×2): 0.5 mg via ORAL
  Filled 2022-04-13 (×2): qty 0.5

## 2022-04-13 MED ORDER — DILTIAZEM HCL 25 MG/5ML IV SOLN
10.0000 mg | Freq: Once | INTRAVENOUS | Status: AC
  Administered 2022-04-13: 10 mg via INTRAVENOUS
  Filled 2022-04-13: qty 5

## 2022-04-13 MED ORDER — APIXABAN 5 MG PO TABS
5.0000 mg | ORAL_TABLET | Freq: Two times a day (BID) | ORAL | Status: DC
  Administered 2022-04-14 – 2022-04-15 (×4): 5 mg via ORAL
  Filled 2022-04-13 (×4): qty 1

## 2022-04-13 MED ORDER — CARVEDILOL 12.5 MG PO TABS
12.5000 mg | ORAL_TABLET | Freq: Two times a day (BID) | ORAL | Status: DC
  Administered 2022-04-14 – 2022-04-15 (×5): 12.5 mg via ORAL
  Filled 2022-04-13 (×5): qty 1

## 2022-04-13 MED ORDER — APIXABAN 5 MG PO TABS: 5.0000 mg | ORAL_TABLET | Freq: Two times a day (BID) | ORAL | Status: DC

## 2022-04-13 MED ORDER — DILTIAZEM HCL-DEXTROSE 125-5 MG/125ML-% IV SOLN (PREMIX)
5.0000 mg/h | INTRAVENOUS | Status: DC
  Administered 2022-04-13 – 2022-04-14 (×2): 5 mg/h via INTRAVENOUS
  Administered 2022-04-14: 15 mg/h via INTRAVENOUS
  Filled 2022-04-13 (×2): qty 125

## 2022-04-13 MED ORDER — MECLIZINE HCL 25 MG PO TABS
25.0000 mg | ORAL_TABLET | Freq: Once | ORAL | Status: AC
  Administered 2022-04-13: 25 mg via ORAL
  Filled 2022-04-13: qty 1

## 2022-04-13 MED ORDER — FLUTICASONE PROPIONATE 50 MCG/ACT NA SUSP: 2.0000 | Freq: Every day | NASAL | Status: DC | PRN

## 2022-04-13 NOTE — ED Notes (Signed)
Lab called, pt's BMP hemolyzed. Pt BMP redrawn and sent to lab. ?

## 2022-04-13 NOTE — H&P (Signed)
?  ?  ?[place] ? ? ?PATIENT NAME: Caroline Zamora   ? ?MR#:  [id number] ? ?DATE OF BIRTH:  Aug 03, 1941 ? ?DATE OF ADMISSION:  04/13/2022 ? ?PRIMARY CARE PHYSICIAN: Kirk Ruths, MD  ? ?Patient is coming from: Home ? ?REQUESTING/REFERRING PHYSICIAN: Nance Pear, MD ? ?CHIEF COMPLAINT:  ? ?Chief Complaint  ?Patient presents with  ? Near Syncope  ?  Pt from home has been experiencing dizziness for the for a while, saw Dr. Jens Som, but dizziness has gotten worst. Tonight, pt stood up and room started spinning, pt fell backwards hitting her head, pt is on eliquis for afib, pt currently in uncontrolled afib.  ? ? ?HISTORY OF PRESENT ILLNESS:  ?Caroline Zamora is a 81 y.o. Caucasian female with medical history significant for atrial fibrillation on Eliquis, hypertension, dyslipidemia, depression, migraine headache and arthritis, who presented to the emergency room with acute onset of syncope when she was trying to get up from a sitting position and fell on the floor.  No head injuries.  She denies any chest pain or palpitations.  No paresthesias or focal muscle weakness.  No nausea or vomiting or abdominal pain.  She experienced several episodes of dizziness lately which has been getting worse.  She admitted to vertigo when she stood up before having syncope.  She she has been having recurrent syncope and has a plan with [facility] for pacemaker placement.  She denies dysuria, oliguria or hematuria or flank pain.  No bleeding diathesis. ? ?ED Course: When she came to the ER, BP was 124/93 with a heart rate of 43 and later 139 and then heart rate 57 and respiratory to 28.  Labs revealed unremarkable BMP and CBC.  High-sensitivity troponin was 3. ? ?EKG as reviewed by me : The patient was in rapid atrial fibrillation on monitor.  EKG is pending. ?Imaging: Noncontrasted CT scan revealed no acute intracranial abnormality.  C-spine CT showed no acute fracture or listhesis of the cervical spine. ? ?The patient was given  10 mg of IV Cardizem followed by IV Cardizem drip, 25 mg of p.o. meclizine and 1 L bolus of IV normal saline.  She remained atrial fibrillation with RVR of 120s despite IV Cardizem drip at 15 mg/h.  I ordered IV amiodarone bolus and drip.  She will be admitted to a progressive unit bed for further evaluation and management ?PAST MEDICAL HISTORY:  ? ?Past Medical History:  ?Diagnosis Date  ? AF (atrial fibrillation) ([place])   ? 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 Chronic kidney disease   ? Colon cancer ([place])   ? Depression   ? Dysphagia   ? Headache   ? migraine  ? Hx of adenomatous colonic polyps   ? Hyperlipidemia   ? Hypertension   ? Paroxysmal atrial fibrillation ([facility])   ? Skin cancer   ? ? ?PAST SURGICAL HISTORY:  ? ?Past Surgical History:  ?Procedure Laterality Date  ? ABDOMINAL HYSTERECTOMY    ? with removal of tubes and ovaries  ? COLONOSCOPY    ? COLONOSCOPY WITH PROPOFOL N/A 09/03/2017  ? Procedure: COLONOSCOPY WITH PROPOFOL;  Surgeon: Manya Silvas, MD;  Location: [facility] ENDOSCOPY;  Service: Endoscopy;  Laterality: N/A;  ? COLONOSCOPY WITH PROPOFOL N/A 09/13/2020  ? Procedure: COLONOSCOPY WITH PROPOFOL;  Surgeon: Lesly Rubenstein, MD;  Location[facility] ENDOSCOPY;  Service: Endoscopy;  Laterality: N/A;  ? DILATION AND CURETTAGE OF UTERUS    ? ESOPHAGOGASTRODUODENOSCOPY    ? ESOPHAGOGASTRODUODENOSCOPY (EGD)  WITH PROPOFOL N/A 09/13/2020  ? Procedure: ESOPHAGOGASTRODUODENOSCOPY (EGD) WITH PROPOFOL;  Surgeon: Lesly Rubenstein, MD;  Location: [facility] ENDOSCOPY;  Service: Endoscopy;  Laterality: N/A;  ? skin cancer removal    ? TONSILLECTOMY    ? TUBAL LIGATION    ? ? ?SOCIAL HISTORY:  ? ?Social History  ? ?Tobacco Use  ? Smoking status: Never  ? Smokeless tobacco: Never  ?Substance Use Topics  ? Alcohol use: Yes  ? ? ?FAMILY HISTORY:  ? ?Family History  ?Problem Relation Age of Onset  ? Breast cancer Neg Hx   ? ? ?DRUG ALLERGIES:  ? ?Allergies  ?Allergen Reactions  ? Desyrel [Trazodone] Rash  ? Sulfa Antibiotics Rash   ? ? ?REVIEW OF SYSTEMS:  ? ?ROS ?As per history of present illness. All pertinent systems were reviewed above. Constitutional, HEENT, cardiovascular, respiratory, GI, GU, musculoskeletal, neuro, psychiatric, endocrine, integumentary and hematologic systems were reviewed and are otherwise negative/unremarkable except for positive findings mentioned above in the HPI. ? ? ?MEDICATIONS AT HOME:  ? ?Prior to Admission medications   ?Medication Sig Start Date End Date Taking? Authorizing Provider  ?acetaminophen (TYLENOL) 500 MG tablet Acetaminophen Extra Strength 500 mg tablet ? Take 1 tablet every 4 hours by oral route.    [provider]  ?apixaban (ELIQUIS) 5 MG TABS tablet Take 5 mg by mouth 2 (two) times daily.    [provider]  ?carvedilol (COREG) 12.5 MG tablet Take 12.5 mg by mouth 2 (two) times daily with a meal. ?Patient not taking: Reported on 03/23/2022    [provider]  ?carvedilol (COREG) 25 MG tablet Take 25 mg by mouth 2 (two) times daily. 02/17/22   [provider]  ?diltiazem (CARDIZEM CD) 240 MG 24 hr capsule Take 1 capsule (240 mg total) by mouth daily. 07/08/19   Bettey Costa, MD  ?escitalopram (LEXAPRO) 10 MG tablet Take 10 mg by mouth daily. ?Patient not taking: Reported on 03/23/2022 07/01/19   [provider]  ?estradiol (ESTRACE) 0.5 MG tablet Take 0.5 mg by mouth daily.    [provider]  ?furosemide (LASIX) 20 MG tablet Take 1 tablet (20 mg) by mouth once a day x 3 days as directed 03/23/22   Deboraha Sprang, MD  ?hydrOXYzine (ATARAX) 25 MG tablet Take 25 mg by mouth 2 (two) times daily as needed. 01/11/22   [provider]  ?LORazepam (ATIVAN) 1 MG tablet Take 1 tablet (1 mg total) by mouth every 6 (six) hours as needed for anxiety. 07/07/19   Bettey Costa, MD  ?mometasone (NASONEX) 50 MCG/ACT nasal spray Place 2 sprays into the nose daily as needed.     [provider]  ?venlafaxine XR (EFFEXOR-XR) 150 MG 24 hr capsule Take 150  mg by mouth daily. 01/20/22   [provider]  ? ?  ? ?VITAL SIGNS:  ?Blood pressure (!) 99/52, pulse 75, temperature 97.9 ?F (36.6 ?C), temperature source Oral, resp. rate 12, SpO2 93 %. ? ?PHYSICAL EXAMINATION:  ?Physical Exam ? ?GENERAL:  81 y.o.-year-old Caucasian female patient lying in the bed with no acute distress.  ?EYES: Pupils equal, round, reactive to light and accommodation. No scleral icterus. Extraocular muscles intact.  ?HEENT: Head atraumatic, normocephalic. Oropharynx and nasopharynx clear.  ?NECK:  Supple, no jugular venous distention. No thyroid enlargement, no tenderness.  ?LUNGS: Normal breath sounds bilaterally, no wheezing, rales,rhonchi or crepitation. No use of accessory muscles of respiration.  ?CARDIOVASCULAR: Irregularly irregular tachycardic rhythm, S1, S2 normal. No  murmurs, rubs, or gallops.  ?ABDOMEN: Soft, nondistended, nontender. Bowel sounds present. No organomegaly or mass.  ?EXTREMITIES: No pedal edema, cyanosis, or clubbing.  ?NEUROLOGIC: Cranial nerves II through XII are intact. Muscle strength 5/5 in all extremities. Sensation intact. Gait not checked.  ?PSYCHIATRIC: The patient is alert and oriented x 3.  Normal affect and good eye contact. ?SKIN: No obvious rash, lesion, or ulcer.  ? ?LABORATORY PANEL:  ? ?CBC ?Recent Labs  ?Lab 04/13/22 ?2037  ?WBC 10.2  ?HGB 13.8  ?HCT 41.9  ?PLT 326  ? ?------------------------------------------------------------------------------------------------------------------ ? ?Chemistries  ?Recent Labs  ?Lab 04/13/22 ?2037  ?NA 136  ?K 3.7  ?CL 106  ?CO2 23  ?GLUCOSE 126*  ?BUN 14  ?CREATININE 0.82  ?CALCIUM 7.9*  ? ?------------------------------------------------------------------------------------------------------------------ ? ?Cardiac Enzymes ?No results for input(s): TROPONINI in the last 168 hours. ?------------------------------------------------------------------------------------------------------------------ ? ?RADIOLOGY:   ?CT Head Wo Contrast ? ?Result Date: 04/13/2022 ?CLINICAL DATA:  Fall with posterior head impact, dizziness, chronic anticoagulation. Mild parenchymal volume loss EXAM: CT HEAD WITHOUT CONTRAST CT CERV

## 2022-04-13 NOTE — ED Provider Notes (Signed)
? ?[facility] ?Provider Note ? ? ? Event Date/Time  ? First MD Initiated Contact with Patient 04/13/22 2015   ?  (approximate) ? ? ?History  ? ?Near Syncope (Pt from home has been experiencing dizziness for the for a while, saw Dr. Jens Som, but dizziness has gotten worst. Tonight, pt stood up and room started spinning, pt fell backwards hitting her head, pt is on eliquis for afib, pt currently in uncontrolled afib.) ? ? ?HPI ? ?Caroline Zamora is a 81 y.o. female  who, per cardiology note dated 03/23/22 was being seen for atrial fibrillation and syncope over the past 3 years, who presents to the emergency department today because of concern for syncopal episodes.  The patient states that she has had issues with dizziness and syncope in the past.  Today when she went to stand up she started feeling very dizzy.  Felt like the room was spinning around her.  She then sat back down.  She tried to stand up again but then passed out and hit the back of her head on the ground.  She says that she has seen a cardiologist for this.  The patient denies any chest pain or palpitations when this occurred. Is on eliquis.  ? ?  ? ? ?Physical Exam  ? ?Most recent vital signs: ?Vitals:  ? 04/13/22 2140 04/13/22 2200  ?BP:  97/83  ?Pulse:  (!) 53  ?Resp:  (!) 23  ?Temp:    ?SpO2: 95% 95%  ? ?General: Awake, alert and oriented. ?CV:  Good peripheral perfusion. Tachycardia, irregular rhythm. ?Resp:  Normal effort. Lungs clear ?Abd:  No distention. Non tender. ? ?ED Results / Procedures / Treatments  ? ?Labs ?(all labs ordered are listed, but only abnormal results are displayed) ?Labs Reviewed  ?BASIC METABOLIC PANEL - Abnormal; Notable for the following components:  ?    Result Value  ? Glucose, Bld 126 (*)   ? Calcium 7.9 (*)   ? All other components within normal limits  ?CBC WITH DIFFERENTIAL/PLATELET  ?BASIC METABOLIC PANEL  ?CBC  ?TROPONIN I (HIGH SENSITIVITY)  ?TROPONIN I (HIGH SENSITIVITY)  ? ? ?RADIOLOGY ?I  independently interpreted and visualized the CT head/cervical spine. My interpretation: No acute osseous abnormality ?Radiology interpretation:  ?  ?IMPRESSION:  ?No acute intracranial injury.  No calvarial fracture.  ?   ?No acute fracture or listhesis of the cervical spine.  ?   ? ? ?PROCEDURES: ? ?Critical Care performed: Yes ? ?Procedures ? ?CRITICAL CARE ?Performed by: Nance Pear ? ? ?Total critical care time: 30 minutes ? ?Critical care time was exclusive of separately billable procedures and treating other patients. ? ?Critical care was necessary to treat or prevent imminent or life-threatening deterioration. ? ?Critical care was time spent personally by me on the following activities: development of treatment plan with patient and/or surrogate as well as nursing, discussions with consultants, evaluation of patient's response to treatment, examination of patient, obtaining history from patient or surrogate, ordering and performing treatments and interventions, ordering and review of laboratory studies, ordering and review of radiographic studies, pulse oximetry and re-evaluation of patient's condition. ? ? ?MEDICATIONS ORDERED IN ED: ?Medications - No data to display ? ? ?IMPRESSION / MDM / ASSESSMENT AND PLAN / ED COURSE  ?I reviewed the triage vital signs and the nursing notes. ?             ?               ? ?  Differential diagnosis includes, but is not limited to, anemia, electrolyte abnormality, atrial fibrillation, head bleed. ? ?Patient presented to the emergency department today after a syncopal episode.  Patient unfortunately has history of syncope.  She did hit her head.  Head CT and cervical spine CT without any concerning traumatic findings.  She was found to be in A-fib with RVR here in the emergency department.  She was given a dose of diltiazem without any significant relief.  Because of this we will start patient on diltiazem drip.  Blood work without any significant electrolyte  abnormality.  No anemia.  Troponin was negative.  Discussed with Dr. Sidney Ace with hospitalist service who will plan on admission. ? ? ?FINAL CLINICAL IMPRESSION(S) / ED DIAGNOSES  ? ?Final diagnoses:  ?Syncope, unspecified syncope type  ?Atrial fibrillation with RVR ([place])  ? ? ? ? ?Note:  This document was prepared using Dragon voice recognition software and may include unintentional dictation errors. ? ?  ?Nance Pear, MD ?04/13/22 2354 ? ?

## 2022-04-14 ENCOUNTER — Telehealth: Payer: Self-pay | Admitting: Internal Medicine

## 2022-04-14 DIAGNOSIS — F32A Depression, unspecified: Secondary | ICD-10-CM

## 2022-04-14 DIAGNOSIS — R55 Syncope and collapse: Secondary | ICD-10-CM

## 2022-04-14 DIAGNOSIS — I5020 Unspecified systolic (congestive) heart failure: Secondary | ICD-10-CM

## 2022-04-14 DIAGNOSIS — I4891 Unspecified atrial fibrillation: Secondary | ICD-10-CM | POA: Diagnosis not present

## 2022-04-14 LAB — BASIC METABOLIC PANEL
Anion gap: 0 — ABNORMAL LOW (ref 5–15)
BUN: 12 mg/dL (ref 8–23)
CO2: 21 mmol/L — ABNORMAL LOW (ref 22–32)
Calcium: 6.4 mg/dL — CL (ref 8.9–10.3)
Chloride: 118 mmol/L — ABNORMAL HIGH (ref 98–111)
Creatinine, Ser: 0.64 mg/dL (ref 0.44–1.00)
GFR, Estimated: >60 mL/min (ref >60)
Glucose, Bld: 116 mg/dL — ABNORMAL HIGH (ref 70–99)
Potassium: 6.2 mmol/L — ABNORMAL HIGH (ref 3.5–5.1)
Sodium: 139 mmol/L (ref 135–145)

## 2022-04-14 LAB — HEPATIC FUNCTION PANEL
ALT: 8 U/L (ref 0–44)
AST: 13 U/L — ABNORMAL LOW (ref 15–41)
Albumin: 2.1 g/dL — ABNORMAL LOW (ref 3.5–5.0)
Alkaline Phosphatase: 57 U/L (ref 38–126)
Bilirubin, Direct: 0.1 mg/dL (ref 0.0–0.2)
Indirect Bilirubin: 0.3 mg/dL (ref 0.3–0.9)
Total Bilirubin: 0.4 mg/dL (ref 0.3–1.2)
Total Protein: 4.8 g/dL — ABNORMAL LOW (ref 6.5–8.1)

## 2022-04-14 LAB — CBC
HCT: 35.9 % — ABNORMAL LOW (ref 36.0–46.0)
Hemoglobin: 11.4 g/dL — ABNORMAL LOW (ref 12.0–15.0)
MCH: 27.2 pg (ref 26.0–34.0)
MCHC: 31.8 g/dL (ref 30.0–36.0)
MCV: 85.7 fL (ref 80.0–100.0)
Platelets: 237 10*3/uL (ref 150–400)
RBC: 4.19 MIL/uL (ref 3.87–5.11)
RDW: 14.1 % (ref 11.5–15.5)
WBC: 7.4 10*3/uL (ref 4.0–10.5)
nRBC: 0 % (ref 0.0–0.2)

## 2022-04-14 LAB — POTASSIUM: Potassium: 3.7 mmol/L (ref 3.5–5.1)

## 2022-04-14 LAB — MAGNESIUM: Magnesium: 1.7 mg/dL (ref 1.7–2.4)

## 2022-04-14 MED ORDER — MAGNESIUM SULFATE 2 GM/50ML IV SOLN
2.0000 g | Freq: Once | INTRAVENOUS | Status: AC
  Administered 2022-04-14: 2 g via INTRAVENOUS
  Filled 2022-04-14: qty 50

## 2022-04-14 MED ORDER — AMIODARONE LOAD VIA INFUSION
150.0000 mg | Freq: Once | INTRAVENOUS | Status: AC
  Administered 2022-04-14: 150 mg via INTRAVENOUS
  Filled 2022-04-14: qty 83.34

## 2022-04-14 MED ORDER — AMIODARONE HCL IN DEXTROSE 360-4.14 MG/200ML-% IV SOLN
60.0000 mg/h | INTRAVENOUS | Status: AC
Start: 2022-04-14 — End: 2022-04-14
  Administered 2022-04-14: 60 mg/h via INTRAVENOUS
  Filled 2022-04-14 (×2): qty 200

## 2022-04-14 MED ORDER — AMIODARONE HCL IN DEXTROSE 360-4.14 MG/200ML-% IV SOLN
30.0000 mg/h | INTRAVENOUS | Status: DC
  Administered 2022-04-14 – 2022-04-15 (×4): 30 mg/h via INTRAVENOUS
  Filled 2022-04-14 (×3): qty 200

## 2022-04-14 MED ORDER — OYSTER SHELL CALCIUM/D3 500-5 MG-MCG PO TABS
1.0000 | ORAL_TABLET | Freq: Three times a day (TID) | ORAL | Status: DC
  Administered 2022-04-14 – 2022-04-15 (×7): 1 via ORAL
  Filled 2022-04-14 (×7): qty 1

## 2022-04-14 NOTE — ED Notes (Signed)
Attending Mansy at the bedside...advised Cardizem drip is not improving pt HR, MD to place order for amiodarone drip ?

## 2022-04-14 NOTE — ED Notes (Signed)
Pt asisted with bedpan ?

## 2022-04-14 NOTE — ED Notes (Signed)
Informed RN bed assigned 

## 2022-04-14 NOTE — ED Notes (Signed)
Pt had large BM.  

## 2022-04-14 NOTE — Care Management (Addendum)
Pre-rounds chart review and preliminary plan / goals for today: ?Significant overnight events per chart:  ?Able to d/c amiodarone gtt ?Significant new findings on labs/other diagnostics: ?Hyperkalemia K 6.2 up from 3.7 - repeat to assess for lab error ?Dispo plan: from home, to go home on discharge but remains inpatient for now ?Pending/Plan:  ?Cardiology consulted re: Afib RVR ? ?Please feel free to reach out via secure chat in Epic for non-urgent issues. Please page for urgent matters! ? ?This note will be updated after rounds to full progress note / less likely discharge note.  ? ?

## 2022-04-14 NOTE — ED Notes (Signed)
Pt resting in bed, with call light within reach. Pt denies any needs at this time.  ?

## 2022-04-14 NOTE — ED Notes (Signed)
Dr Sheppard Coil at bedside, re-order for lab work due to elevated potassium levels, pt states labs were drawn from her iv, pt has ns with potassium running, straight stick used for repeat blood work on opposite arm ?

## 2022-04-14 NOTE — Assessment & Plan Note (Addendum)
LVEF 35 to 40%, global hypokinesis, grade 1 diastolic dysfunction.   ?? Appears euvolemic, compensated.   ?? Continue home beta-blocker, Lasix. ?? Cardiology following  ?

## 2022-04-14 NOTE — Assessment & Plan Note (Addendum)
Atrial fibrillation possibly tachy/brady causing orthostatic problems  ?? Management per cardiology - transfer to [facility] pending  ?

## 2022-04-14 NOTE — Assessment & Plan Note (Addendum)
Initially on diltiazem drip, requiring addition of amiodarone drip and titration off diltiazem yesterday however diltiazem had to be restarted yesterday, was able to come off last night then HR up again early this am. ?? Cardiology following   ?? Cardiology recs as of 05/12:  ?? plan to continue amiodarone bolus for 24 hours, transition to p.o.   ?? Restarted diltiazem, uptitrate for rate control, come off this as able.   ?? Consider changing beta-blocker, continue Eliquis ?? will reach out to electrophysiology to see if patient can be evaluated during this admission ?? Transfer to [facility] for cardiac MRI and EP study vs ICD implant  ? ?

## 2022-04-14 NOTE — ED Notes (Signed)
Pt assisted with a bedpan 

## 2022-04-14 NOTE — ED Notes (Signed)
Pt unable to void on the bedpan, pt attempted to sit up on the bedpan and was unable to tolerate it and sat back down and broke out into a sweat, pt states that she hasn't voided but a tiny amount in 24 hours and fell at home attempting to void at that time. Bladder scan performed and found 425m's of urine, dr kCorky Downsmade aware and placed an order for an in and out cath, pt tol well and 600 ml's removed from pt's bladder ?

## 2022-04-14 NOTE — Progress Notes (Signed)
?PROGRESS NOTE ? ? ? ?Caroline Zamora  OAC:[id number] DOB: [birth year]  ?DOA: 04/13/2022 ?Date of Service: Today 04/14/22 which is Hospital Day 1 ?PCP: Kirk Ruths, MD ? ? ? ? ?Brief Narrative & Hospital Course:  ?Patient presented to emergency department 04/13/2022, chief complaint dizziness/syncope.  Prior to presenting to ED, tried to stand up and passed out, hit back of her head.  Following with cardiology, is on Eliquis for history of A-fib, ongoing issues with dizziness/syncope and plan for "pacemaker placement" next month with Dr. Jens Som (EP).  No chest pain, palpitations. ?ED course and initial admission 04/13/2022: Found to be in A-fib with RVR.  CT head no concerns.  Initially given 10 mg IV Cardizem, started on Cardizem drip, received meclizine and 1 L bolus NS.  Amiodarone drip started and Cardizem tapered off.  Cardiology was consulted ?04/13/2022: Cardizem had to be restarted this afternoon due to persistent RVR.  Cardiology plan to continue amiodarone bolus for 24 hours, transition to p.o.  Restarted diltiazem, uptitrate for rate control.  Consider changing beta-blocker, continue Eliquis, will reach out to electrophysiology to see if patient can be evaluated during this admission.  History of HFrEF, LVEF 35 to 40%, global hypokinesis, grade 1 diastolic dysfunction.  Appears euvolemic, compensated.  Continue home beta-blocker, Lasix. ? ?Consultants:  ?Cardiology ? ?Procedures: ?none ? ? ? ?Subjective: ?Patient reports still having issues with orthostatic dizziness/near syncope anytime she tries to sit up, even in bed.  Otherwise, reports overall improved, no chest pain, no episodes of syncope since she has been in the ED. ? ? ? ? ? ? ?ASSESSMENT & PLAN: ?  ?Principal Problem: ?  Atrial fibrillation with rapid ventricular response ([place]) ?Active Problems: ?  Syncope and collapse ?  Depression ?  HFrEF (heart failure with reduced ejection fraction) ([place]) ? ? ?Atrial fibrillation with rapid  ventricular response ([place]) ?Initially on diltiazem drip, requiring addition of amiodarone drip and titration off diltiazem yesterday however diltiazem had to be restarted today.  ?Cardiology recs:  ?plan to continue amiodarone bolus for 24 hours, transition to p.o.   ?Restarted diltiazem, uptitrate for rate control, come off this as able.   ?Consider changing beta-blocker, continue Eliquis ?will reach out to electrophysiology to see if patient can be evaluated during this admission.   ? ?Syncope and collapse ?Atrial fibrillation possibly tachy/brady causing orthostatic problems  ?Management per cardiology - may need EP eval  ? ?Depression ?continue Effexor XR and Lexapro. ? ?HFrEF (heart failure with reduced ejection fraction) ([place]) ?LVEF 35 to 40%, global hypokinesis, grade 1 diastolic dysfunction.   ?Appears euvolemic, compensated.   ?Continue home beta-blocker, Lasix. ?Cardiology following  ? ? ? ?DVT prophylaxis: eliquis ?Code Status: FULL ?Family Communication: none at this time ?Disposition Plan: remains inpatient  ? ? ? ? ? ?Objective: ?Vitals:  ? 04/14/22 1330 04/14/22 1400 04/14/22 1430 04/14/22 1500  ?BP: (!) 149/102 (!) 155/98 (!) 161/98 (!) 137/96  ?Pulse: 70 (!) 108 (!) 127 (!) 123  ?Resp: (!) '25 19 20 '$ (!) 35  ?Temp:      ?TempSrc:      ?SpO2: 96% 95% 97% 95%  ? ? ?Intake/Output Summary (Last 24 hours) at 04/14/2022 1635 ?Last data filed at 04/14/2022 0941 ?Gross per 24 hour  ?Intake 1105.69 ml  ?Output --  ?Net 1105.69 ml  ? ?There were no vitals filed for this visit. ? ?Examination: ?General exam: Appears calm and comfortable  ?Respiratory system: Clear to auscultation. Respiratory effort normal. ?Cardiovascular system: S1 &  S2 heard, tachcyardic. No pedal edema. ?Gastrointestinal system: Abdomen is nondistended, soft and nontender. No organomegaly or masses felt. Normal bowel sounds heard. ?Central nervous system: Alert and oriented.  ?Extremities: Symmetric 5 x 5 power. ?Skin: No rashes, lesions or  ulcers on limited exam  ?Psychiatry: Judgement and insight appear normal. Mood & affect appropriate.  ? ? ? ? ? ? ?Scheduled Medications:  ? apixaban  5 mg Oral BID  ? calcium-vitamin D  1 tablet Oral TID  ? carvedilol  12.5 mg Oral BID  ? estradiol  0.5 mg Oral QHS  ? furosemide  20 mg Oral Daily  ? venlafaxine XR  150 mg Oral Daily  ? ? ?Continuous Infusions: ? 0.9 % NaCl with KCl 20 mEq / L 100 mL/hr at 04/14/22 1402  ? amiodarone 30 mg/hr (04/14/22 1543)  ? diltiazem (CARDIZEM) infusion Stopped (04/14/22 0224)  ? magnesium sulfate bolus IVPB 2 g (04/14/22 1542)  ? ? ?PRN Medications:  ?acetaminophen **OR** acetaminophen, fluticasone, hydrOXYzine, LORazepam, magnesium hydroxide, melatonin, ondansetron **OR** ondansetron (ZOFRAN) IV ? ?Antimicrobials:  ?Anti-infectives (From admission, onward)  ? ? None  ? ?  ? ? ?Data Reviewed: I have personally reviewed following labs and imaging studies ? ?CBC: ?Recent Labs  ?Lab 04/13/22 ?2037 04/14/22 ?0438  ?WBC 10.2 7.4  ?NEUTROABS 7.3  --   ?HGB 13.8 11.4*  ?HCT 41.9 35.9*  ?MCV 83.8 85.7  ?PLT 326 237  ? ?Basic Metabolic Panel: ?Recent Labs  ?Lab 04/13/22 ?2037 04/14/22 ?1610 04/14/22 ?9604  ?NA 136 139  --   ?K 3.7 6.2* 3.7  ?CL 106 118*  --   ?CO2 23 21*  --   ?GLUCOSE 126* 116*  --   ?BUN 14 12  --   ?CREATININE 0.82 0.64  --   ?CALCIUM 7.9* 6.4*  --   ?MG  --  1.7  --   ? ?GFR: ?CrCl cannot be calculated (Unknown ideal weight.). ?Liver Function Tests: ?Recent Labs  ?Lab 04/14/22 ?5409  ?AST 13*  ?ALT 8  ?ALKPHOS 57  ?BILITOT 0.4  ?PROT 4.8*  ?ALBUMIN 2.1*  ? ?No results for input(s): LIPASE, AMYLASE in the last 168 hours. ?No results for input(s): AMMONIA in the last 168 hours. ?Coagulation Profile: ?No results for input(s): INR, PROTIME in the last 168 hours. ?Cardiac Enzymes: ?No results for input(s): CKTOTAL, CKMB, CKMBINDEX, TROPONINI in the last 168 hours. ?BNP (last 3 results) ?No results for input(s): PROBNP in the last 8760 hours. ?HbA1C: ?No results for  input(s): HGBA1C in the last 72 hours. ?CBG: ?No results for input(s): GLUCAP in the last 168 hours. ?Lipid Profile: ?No results for input(s): CHOL, HDL, LDLCALC, TRIG, CHOLHDL, LDLDIRECT in the last 72 hours. ?Thyroid Function Tests: ?No results for input(s): TSH, T4TOTAL, FREET4, T3FREE, THYROIDAB in the last 72 hours. ?Anemia Panel: ?No results for input(s): VITAMINB12, FOLATE, FERRITIN, TIBC, IRON, RETICCTPCT in the last 72 hours. ?Urine analysis: ?   ?Component Value Date/Time  ? COLORURINE YELLOW (A) 07/04/2019 1343  ? APPEARANCEUR HAZY (A) 07/04/2019 1343  ? LABSPEC 1.018 07/04/2019 1343  ? PHURINE 6.0 07/04/2019 1343  ? GLUCOSEU NEGATIVE 07/04/2019 1343  ? HGBUR MODERATE (A) 07/04/2019 1343  ? [place] NEGATIVE 07/04/2019 1343  ? KETONESUR 20 (A) 07/04/2019 1343  ? PROTEINUR 30 (A) 07/04/2019 1343  ? NITRITE NEGATIVE 07/04/2019 1343  ? LEUKOCYTESUR TRACE (A) 07/04/2019 1343  ? ?Sepsis Labs: ?'@LABRCNTIP'$ (procalcitonin:4,lacticidven:4) ? ?No results found for this or any previous visit (from the past 240 hour(s)).  ? ? ? ? ? ?  Radiology Studies last 96 hours: ?CT Head Wo Contrast ? ?Result Date: 04/13/2022 ?CLINICAL DATA:  Fall with posterior head impact, dizziness, chronic anticoagulation. Mild parenchymal volume loss EXAM: CT HEAD WITHOUT CONTRAST CT CERVICAL SPINE WITHOUT CONTRAST TECHNIQUE: Multidetector CT imaging of the head and cervical spine was performed following the standard protocol without intravenous contrast. Multiplanar CT image reconstructions of the cervical spine were also generated. RADIATION DOSE REDUCTION: This exam was performed according to the departmental dose-optimization program which includes automated exposure control, adjustment of the mA and/or kV according to patient size and/or use of iterative reconstruction technique. COMPARISON:  None Available. FINDINGS: CT HEAD FINDINGS Brain: Normal anatomic configuration. Is commensurate with the patient's age. Tiny remote lacunar  infarct within the right thalamus. No abnormal intra or extra-axial mass lesion or fluid collection. No abnormal mass effect or midline shift. No evidence of acute intracranial hemorrhage or infarct. Ventricular size is n

## 2022-04-14 NOTE — ED Notes (Signed)
Pt reports feeling much better, no needs at current, will continue to monitor. ?

## 2022-04-14 NOTE — ED Notes (Signed)
Pt assisted with bed pan

## 2022-04-14 NOTE — ED Notes (Signed)
RN to bedside to introduce self to pt. Pt sleeping at this time.  

## 2022-04-14 NOTE — ED Notes (Signed)
Pt resting at this time, no needs voiced, HR has improved, pt denies Sob or CP. Pt remains on cardiac monitor, safety in place, and call bell in reach for assistance. Will continue to monitor HR.  ?

## 2022-04-14 NOTE — ED Notes (Signed)
Dr. Sidney Ace verbal order to stop Cardizem drip, pt's HR has sustained in the 70s for past 15 mins. Will continue to monitor.  ?

## 2022-04-14 NOTE — ED Notes (Signed)
Pt appears to be sleeping, eyes closed, NAD noted, RR even and unlabored, VSS, HR NSR w/occasional PVC. Skin warm and dry, safety in place, call bell in reach for assistance. Will continue to monitor pt, POC on-going  ?

## 2022-04-14 NOTE — Assessment & Plan Note (Addendum)
?   continue Effexor XR and Lexapro. ?

## 2022-04-14 NOTE — Consult Note (Addendum)
?[facility] [facility] CARDIOLOGY CONSULT NOTE  ? ?    ?Patient ID: ?Caroline Zamora ?MRN: [id number] ?DOB/AGE: 79-May-1942 79 y.o. ? ?Admit date: 04/13/2022 ?Referring Physician Dr. Emeterio Reeve ?Primary Physician Dr. Frazier Richards ?Primary Cardiologist Dr. Saralyn Pilar (general cardiology), Dr. Caryl Comes (EP)  ?Reason for Consultation AF RVR ? ?HPI: Caroline Zamora is a 81yoF with  PMH of paroxysmal atrial fibrillation on eliquis, 3 year h/o recurrent syncope with concern for arrhythmic etiology - with ongoing EP eval, HFrEF (LVEF 35-40%, global hypo, g1dd, mild MR 04/10/2022), hypertension, hyperlipidemia who presented to [facility] ED 04/13/2022 with increasing dizziness and a syncopal episode yesterday afternoon.  Cardiology is consulted for further assistance. ? ?Patient has a 3-year history of recurrent syncope, recently evaluated by electrophysiology Dr. Caryl Comes on 03/23/2022 where there was concern for arrhythmic etiology for her recurrent syncopal episodes, which are brief in nature typically.  She had a repeat echocardiogram on 04/10/2022 which showed a reduced ejection fraction to 35-40%, G1 DD, mild MR for which further evaluation was recommended with cardiac MRI to exclude CAD/MI as a cause of her cardiomyopathy, and then consideration for ICD placement sooner rather than later were recommended.  She is currently scheduled for ICD placement June 1. ? ?The patient presented to [facility] ED yesterday evening with worsening dizzy spells and multiple syncopal episodes over the past week.  She notes 2 falls yesterday where she went to stand up, felt dizzy sat back down, tried to stand again and then passed out and hit her head on the ground.  Very brief prodrome of shaking in her hands prior to syncopal episodes, these are identical to what she has been experiencing.  She says she had a poor appetite yesterday and did not eat anything until 79 the afternoon which is unusual for her.  On arrival to the ED had she was found to be in  atrial fibrillation with rapid ventricular rate, given 1 dose of IV diltiazem without improvement, and started on a diltiazem drip and eventually an amiodarone load and infusion with control of her heart rate, so diltiazem drip was discontinued.  At my time of evaluation this afternoon she was back in atrial fibrillation with RVR, the diltiazem drip has been turned off earlier in the day as she was in sinus rhythm.  She currently feels her heart racing / feels jittery and sweaty, but denies chest discomfort, shortness of breath.  She is lying nearly flat in the ED stretcher in is worried about sitting upright due to fear of passing out again.  She reports compliance with all her medications including her Eliquis. ? ?She states that the syncopal episodes and heart racing have seriously limited her quality of life and ability to function, she has previously able to exercise regularly, doing aqua aerobics and intentionally losing 25 to 30 pounds (in addition to making changes to her diet).  She denies prior history of cardiac catheterization.  She says her father had atrial fibrillation and paternal grandfather died of a heart attack in his late 79s. ? ?Labs on admission are notable for a potassium of 3.7, BUN/creatinine 14/0.82, GFR greater than 60.  High-sensitivity troponin less than 2-3.  H&H 11.4/35.9, platelets 237.  Vitals during interview are notable for a blood pressure of 137/96, heart rate on telemetry between 109-125 in atrial fibrillation with frequent PVCs ? ?Review of systems complete and found to be negative unless listed above  ? ? ? ?Past Medical History:  ?Diagnosis Date  ? AF (atrial fibrillation) (  [place])   ? 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 Chronic kidney disease   ? Colon cancer ([place])   ? Depression   ? Dysphagia   ? Headache   ? migraine  ? Hx of adenomatous colonic polyps   ? Hyperlipidemia   ? Hypertension   ? Paroxysmal atrial fibrillation ([facility])   ? Skin cancer   ?  ?Past Surgical History:   ?Procedure Laterality Date  ? ABDOMINAL HYSTERECTOMY    ? with removal of tubes and ovaries  ? COLONOSCOPY    ? COLONOSCOPY WITH PROPOFOL N/A 09/03/2017  ? Procedure: COLONOSCOPY WITH PROPOFOL;  Surgeon: Manya Silvas, MD;  Location: [facility] ENDOSCOPY;  Service: Endoscopy;  Laterality: N/A;  ? COLONOSCOPY WITH PROPOFOL N/A 09/13/2020  ? Procedure: COLONOSCOPY WITH PROPOFOL;  Surgeon: Lesly Rubenstein, MD;  Location: [facility] ENDOSCOPY;  Service: Endoscopy;  Laterality: N/A;  ? DILATION AND CURETTAGE OF UTERUS    ? ESOPHAGOGASTRODUODENOSCOPY    ? ESOPHAGOGASTRODUODENOSCOPY (EGD) WITH PROPOFOL N/A 09/13/2020  ? Procedure: ESOPHAGOGASTRODUODENOSCOPY (EGD) WITH PROPOFOL;  Surgeon: Lesly Rubenstein, MD;  Location: [facility] ENDOSCOPY;  Service: Endoscopy;  Laterality: N/A;  ? skin cancer removal    ? TONSILLECTOMY    ? TUBAL LIGATION    ?  ?(Not in a hospital admission) ? ?Social History  ? ?Socioeconomic History  ? Marital status: Widowed  ?  Spouse name: Not on file  ? Number of children: Not on file  ? Years of education: Not on file  ? Highest education level: Not on file  ?Occupational History  ? Not on file  ?Tobacco Use  ? Smoking status: Never  ? Smokeless tobacco: Never  ?Vaping Use  ? Vaping Use: Never used  ?Substance and Sexual Activity  ? Alcohol use: Yes  ? Drug use: No  ? Sexual activity: Not on file  ?Other Topics Concern  ? Not on file  ?Social History Narrative  ? Not on file  ? ?Social Determinants of Health  ? ?Financial Resource Strain: Not on file  ?Food Insecurity: Not on file  ?Transportation Needs: Not on file  ?Physical Activity: Not on file  ?Stress: Not on file  ?Social Connections: Not on file  ?Intimate Partner Violence: Not on file  ?  ?Family History  ?Problem Relation Age of Onset  ? Breast cancer Neg Hx   ?  ? ? ?PHYSICAL EXAM ?General: very pleasant elderly caucasian female , well nourished, appears somewhat uncomfortable/fidgety, laying flat in ED stretcher. Healing ecchymosis on  posterior left shoulder ?HEENT:  Normocephalic and atraumatic. ?Neck:  No JVD.  ?Lungs: Normal respiratory effort on room air. Clear bilaterally to auscultation. No wheezes, crackles, rhonchi.  ?Heart: Tachy irregularly irregular rate and rhyhm. Normal S1 and S2 without gallops or murmurs. Radial & DP pulses 2+ bilaterally. ?Abdomen: Non-distended appearing.  ?Msk: Normal strength and tone for age. ?Extremities: Warm and well perfused. No clubbing, cyanosis.  Left edema.  ?Neuro: Alert and oriented X 3. ?Psych:  Answers questions appropriately.  ? ?Labs: ?  ?Lab Results  ?Component Value Date  ? WBC 7.4 04/14/2022  ? HGB 11.4 (L) 04/14/2022  ? HCT 35.9 (L) 04/14/2022  ? MCV 85.7 04/14/2022  ? PLT 237 04/14/2022  ?  ?Recent Labs  ?Lab 04/14/22 ?0438 04/14/22 ?1696  ?NA 139  --   ?K 6.2* 3.7  ?CL 118*  --   ?CO2 21*  --   ?BUN 12  --   ?CREATININE 0.64  --   ?  CALCIUM 6.4*  --   ?PROT 4.8*  --   ?BILITOT 0.4  --   ?ALKPHOS 57  --   ?ALT 8  --   ?AST 13*  --   ?GLUCOSE 116*  --   ? ?No results found for: CKTOTAL, CKMB, CKMBINDEX, TROPONINI No results found for: CHOL ?No results found for: HDL ?No results found for: [place] ?No results found for: TRIG ?No results found for: CHOLHDL ?No results found for: LDLDIRECT  ?  ?Radiology: CT Head Wo Contrast ? ?Result Date: 04/13/2022 ?CLINICAL DATA:  Fall with posterior head impact, dizziness, chronic anticoagulation. Mild parenchymal volume loss EXAM: CT HEAD WITHOUT CONTRAST CT CERVICAL SPINE WITHOUT CONTRAST TECHNIQUE: Multidetector CT imaging of the head and cervical spine was performed following the standard protocol without intravenous contrast. Multiplanar CT image reconstructions of the cervical spine were also generated. RADIATION DOSE REDUCTION: This exam was performed according to the departmental dose-optimization program which includes automated exposure control, adjustment of the mA and/or kV according to patient size and/or use of iterative reconstruction  technique. COMPARISON:  None Available. FINDINGS: CT HEAD FINDINGS Brain: Normal anatomic configuration. Is commensurate with the patient's age. Tiny remote lacunar infarct within the right thalamus. No abnormal intra or ex

## 2022-04-14 NOTE — Telephone Encounter (Signed)
I called and spoke with the patient's son Caroline Zamora. ?I advised him that in regards to his call about Caroline Zamora seeing the patient while she is admitted- Dr. Caryl Zamora is currently out of the country and will not be available until the week after next.  ? ?I also advised Caroline Zamora that I do not have DPR on file that I can speak with him.  ? ?I advised her can ask the patient to please call and give verbal permission that we can speak with him. ?Caroline Zamora asked if I could call the patient.  ? ?Attempted to call the patient in regards to Caroline Zamora's request and also to discuss her echo results as per Dr. Caryl Zamora. ?No answer- I left a message to please call back. ? ?Echo results per Dr. Caryl Zamora: ?Caroline Sprang, MD  ?04/13/2022 10:34 AM EDT   ?  ?Please Inform Patient that the echo remains  abnormal and will require further eval-- we should do cMRI to exclude CAD/MI  as cause of her cardiomyopathy and then we shouild proceed with ICD sooner given recurrent syncope ?  ?Thanks  ? ?

## 2022-04-14 NOTE — Telephone Encounter (Signed)
Son called in to say patient been admitted to the hospital and would like for Dr. Caryl Comes to be aware of it. And follow her up in the hospital. Please advise ?

## 2022-04-15 ENCOUNTER — Encounter: Payer: Self-pay | Admitting: Cardiovascular Disease

## 2022-04-15 ENCOUNTER — Other Ambulatory Visit: Payer: Self-pay

## 2022-04-15 ENCOUNTER — Inpatient Hospital Stay (HOSPITAL_COMMUNITY)
Admission: AD | Admit: 2022-04-15 | Discharge: 2022-04-19 | DRG: 287 | Disposition: A | Discharge status: Home / Self Care | Payer: Medicare HMO | Source: Other Acute Inpatient Hospital | Attending: Cardiology | Admitting: Cardiology

## 2022-04-15 DIAGNOSIS — Z85038 Personal history of other malignant neoplasm of large intestine: Secondary | ICD-10-CM

## 2022-04-15 DIAGNOSIS — Z79899 Other long term (current) drug therapy: Secondary | ICD-10-CM | POA: Diagnosis not present

## 2022-04-15 DIAGNOSIS — I951 Orthostatic hypotension: Secondary | ICD-10-CM | POA: Diagnosis present

## 2022-04-15 DIAGNOSIS — I2510 Atherosclerotic heart disease of native coronary artery without angina pectoris: Secondary | ICD-10-CM | POA: Diagnosis not present

## 2022-04-15 DIAGNOSIS — I4891 Unspecified atrial fibrillation: Secondary | ICD-10-CM | POA: Diagnosis present

## 2022-04-15 DIAGNOSIS — I25110 Atherosclerotic heart disease of native coronary artery with unstable angina pectoris: Secondary | ICD-10-CM | POA: Diagnosis present

## 2022-04-15 DIAGNOSIS — I493 Ventricular premature depolarization: Secondary | ICD-10-CM | POA: Diagnosis not present

## 2022-04-15 DIAGNOSIS — I4510 Unspecified right bundle-branch block: Secondary | ICD-10-CM | POA: Diagnosis present

## 2022-04-15 DIAGNOSIS — R55 Syncope and collapse: Secondary | ICD-10-CM | POA: Diagnosis not present

## 2022-04-15 DIAGNOSIS — I480 Paroxysmal atrial fibrillation: Secondary | ICD-10-CM | POA: Diagnosis present

## 2022-04-15 DIAGNOSIS — E1122 Type 2 diabetes mellitus with diabetic chronic kidney disease: Secondary | ICD-10-CM | POA: Diagnosis present

## 2022-04-15 DIAGNOSIS — Z882 Allergy status to sulfonamides status: Secondary | ICD-10-CM

## 2022-04-15 DIAGNOSIS — E785 Hyperlipidemia, unspecified: Secondary | ICD-10-CM | POA: Diagnosis present

## 2022-04-15 DIAGNOSIS — Z85828 Personal history of other malignant neoplasm of skin: Secondary | ICD-10-CM | POA: Diagnosis not present

## 2022-04-15 DIAGNOSIS — I130 Hypertensive heart and chronic kidney disease with heart failure and stage 1 through stage 4 chronic kidney disease, or unspecified chronic kidney disease: Secondary | ICD-10-CM | POA: Diagnosis present

## 2022-04-15 DIAGNOSIS — Z7901 Long term (current) use of anticoagulants: Secondary | ICD-10-CM | POA: Diagnosis not present

## 2022-04-15 DIAGNOSIS — F32A Depression, unspecified: Secondary | ICD-10-CM | POA: Diagnosis present

## 2022-04-15 DIAGNOSIS — I4892 Unspecified atrial flutter: Secondary | ICD-10-CM

## 2022-04-15 DIAGNOSIS — H8110 Benign paroxysmal vertigo, unspecified ear: Secondary | ICD-10-CM | POA: Diagnosis present

## 2022-04-15 DIAGNOSIS — I5022 Chronic systolic (congestive) heart failure: Secondary | ICD-10-CM | POA: Diagnosis present

## 2022-04-15 DIAGNOSIS — Z79818 Long term (current) use of other agents affecting estrogen receptors and estrogen levels: Secondary | ICD-10-CM | POA: Diagnosis not present

## 2022-04-15 DIAGNOSIS — Z888 Allergy status to other drugs, medicaments and biological substances status: Secondary | ICD-10-CM

## 2022-04-15 DIAGNOSIS — N182 Chronic kidney disease, stage 2 (mild): Secondary | ICD-10-CM | POA: Diagnosis present

## 2022-04-15 HISTORY — DX: Syncope and collapse: R55

## 2022-04-15 HISTORY — DX: Paroxysmal atrial fibrillation: I48.0

## 2022-04-15 HISTORY — DX: Chronic kidney disease, stage 2 (mild): N18.2

## 2022-04-15 LAB — CBC
HCT: 39.8 % (ref 36.0–46.0)
Hemoglobin: 12.7 g/dL (ref 12.0–15.0)
MCH: 27.3 pg (ref 26.0–34.0)
MCHC: 31.9 g/dL (ref 30.0–36.0)
MCV: 85.6 fL (ref 80.0–100.0)
Platelets: 270 10*3/uL (ref 150–400)
RBC: 4.65 MIL/uL (ref 3.87–5.11)
RDW: 13.9 % (ref 11.5–15.5)
WBC: 8 10*3/uL (ref 4.0–10.5)
nRBC: 0 % (ref 0.0–0.2)

## 2022-04-15 LAB — BASIC METABOLIC PANEL
Anion gap: 5 (ref 5–15)
BUN: 10 mg/dL (ref 8–23)
CO2: 26 mmol/L (ref 22–32)
Calcium: 8.4 mg/dL — ABNORMAL LOW (ref 8.9–10.3)
Chloride: 107 mmol/L (ref 98–111)
Creatinine, Ser: 0.86 mg/dL (ref 0.44–1.00)
GFR, Estimated: >60 mL/min (ref >60)
Glucose, Bld: 115 mg/dL — ABNORMAL HIGH (ref 70–99)
Potassium: 3.8 mmol/L (ref 3.5–5.1)
Sodium: 138 mmol/L (ref 135–145)

## 2022-04-15 LAB — BRAIN NATRIURETIC PEPTIDE: B Natriuretic Peptide: 251.6 pg/mL — ABNORMAL HIGH (ref 0.0–100.0)

## 2022-04-15 MED ORDER — CARVEDILOL 12.5 MG PO TABS
12.5000 mg | ORAL_TABLET | Freq: Two times a day (BID) | ORAL | Status: DC
  Administered 2022-04-16 – 2022-04-17 (×3): 12.5 mg via ORAL
  Filled 2022-04-15 (×3): qty 1

## 2022-04-15 MED ORDER — VENLAFAXINE HCL ER 150 MG PO CP24
150.0000 mg | ORAL_CAPSULE | Freq: Every day | ORAL | Status: DC
  Administered 2022-04-16 – 2022-04-19 (×4): 150 mg via ORAL
  Filled 2022-04-15 (×4): qty 1

## 2022-04-15 MED ORDER — APIXABAN 5 MG PO TABS
5.0000 mg | ORAL_TABLET | Freq: Two times a day (BID) | ORAL | Status: DC
  Administered 2022-04-16: 5 mg via ORAL
  Filled 2022-04-15 (×2): qty 1

## 2022-04-15 MED ORDER — ONDANSETRON HCL 4 MG/2ML IJ SOLN: 4.0000 mg | Freq: Three times a day (TID) | INTRAMUSCULAR | Status: DC | PRN

## 2022-04-15 MED ORDER — FLUTICASONE PROPIONATE 50 MCG/ACT NA SUSP: 2.0000 | Freq: Every day | NASAL | Status: DC | PRN

## 2022-04-15 MED ORDER — FUROSEMIDE 20 MG PO TABS
20.0000 mg | ORAL_TABLET | Freq: Every day | ORAL | Status: DC
  Administered 2022-04-16: 20 mg via ORAL
  Filled 2022-04-15: qty 1

## 2022-04-15 MED ORDER — ESTRADIOL 1 MG PO TABS
0.5000 mg | ORAL_TABLET | Freq: Every day | ORAL | Status: DC
  Administered 2022-04-16 – 2022-04-18 (×3): 0.5 mg via ORAL
  Filled 2022-04-15 (×5): qty 0.5

## 2022-04-15 MED ORDER — MELATONIN 3 MG PO TABS: 3.0000 mg | ORAL_TABLET | Freq: Every evening | ORAL | Status: DC | PRN

## 2022-04-15 MED ORDER — OYSTER SHELL CALCIUM/D3 500-5 MG-MCG PO TABS
1.0000 | ORAL_TABLET | Freq: Three times a day (TID) | ORAL | Status: DC
  Administered 2022-04-16 – 2022-04-19 (×10): 1 via ORAL
  Filled 2022-04-15 (×12): qty 1

## 2022-04-15 MED ORDER — DILTIAZEM HCL 25 MG/5ML IV SOLN
20.0000 mg | Freq: Once | INTRAVENOUS | Status: AC
  Administered 2022-04-15: 20 mg via INTRAVENOUS
  Filled 2022-04-15: qty 5

## 2022-04-15 MED ORDER — LORAZEPAM 1 MG PO TABS
1.0000 mg | ORAL_TABLET | Freq: Four times a day (QID) | ORAL | Status: DC | PRN
  Administered 2022-04-16 – 2022-04-17 (×2): 1 mg via ORAL
  Filled 2022-04-15 (×2): qty 1

## 2022-04-15 MED ORDER — LOSARTAN POTASSIUM 25 MG PO TABS
25.0000 mg | ORAL_TABLET | Freq: Every day | ORAL | Status: DC
  Administered 2022-04-16 – 2022-04-19 (×4): 25 mg via ORAL
  Filled 2022-04-15 (×4): qty 1

## 2022-04-15 MED ORDER — METOPROLOL TARTRATE 5 MG/5ML IV SOLN
5.0000 mg | INTRAVENOUS | Status: DC | PRN
  Administered 2022-04-15: 5 mg via INTRAVENOUS
  Filled 2022-04-15: qty 5

## 2022-04-15 MED ORDER — DEXTROSE 5 % IV SOLN
30.0000 mg/h | INTRAVENOUS | Status: DC
  Administered 2022-04-16: 30 mg/h via INTRAVENOUS
  Filled 2022-04-15: qty 9

## 2022-04-15 MED ORDER — HYDROXYZINE HCL 25 MG PO TABS: 25.0000 mg | ORAL_TABLET | Freq: Three times a day (TID) | ORAL | Status: DC | PRN

## 2022-04-15 NOTE — H&P (Signed)
?Cardiology Admission History and Physical:  ? ?Patient ID: Caroline Zamora ?MRN: [id number]; DOB: 08-Nov-1941  ? ?Admission date: 04/15/2022 ? ?PCP:  Caroline Ruths, MD ?  ?[facility] Providers ?Cardiologist:  None      ? ? ?Chief Complaint:  syncope ? ? ?Assessment and Plan:  ?33F with HFrEF 35-40%, paroxysmal AF/AFL on apixaban, RBBB, and HTN presented to [facility] on 5/11 for recurrent syncope and was found to be in AF with RVR. Troponin negative. EKG shows AF with RBBB. She was started on IV amiodarone and transferred here for CMR and EP evaluation. ? ?HFrEF ?- needs ischemic evaluation ?- A1c, lipid panel, TSH, ferritin, iron panel ?- CMR pending  ?- continue coreg 12.5 mg bid ?- start losartan 25 mg daily ?- spironolactone tomorrow if able to tolerate ?- SGLT2i prior to discharge if able to tolerate ?- lasix 20 mg daily ? ?Paroxysmal AF ?- apixaban ?- IV amio, will switch to PO after completion of IV load ?- coreg ? ?Syncope ?- obtain orthostatic vitals ?- EP to evaluate for ILR ? ?Leota Jacobsen, MD ? ? ?Risk Assessment/Risk Scores:  ?  ?  ? ?New York Heart Association (NYHA) Functional Class ?NYHA Class II ? ?CHA2DS2-VASc Score = 5  ? This indicates a 7.2% annual risk of stroke. ?The patient's score is based upon: ?CHF History: 1 ?HTN History: 1 ?Diabetes History: 0 ?Stroke History: 0 ?Vascular Disease History: 0 ?Age Score: 2 ?Gender Score: 1 ?  ? ? ? ?Severity of Illness: ?The appropriate patient status for this patient is INPATIENT. Inpatient status is judged to be reasonable and necessary in order to provide the required intensity of service to ensure the patient's safety. The patient's presenting symptoms, physical exam findings, and initial radiographic and laboratory data in the context of their chronic comorbidities is felt to place them at high risk for further clinical deterioration. Furthermore, it is not anticipated that the patient will be medically stable for discharge from the  hospital within 2 midnights of admission.  ? ?* I certify that at the point of admission it is my clinical judgment that the patient will require inpatient hospital care spanning beyond 2 midnights from the point of admission due to high intensity of service, high risk for further deterioration and high frequency of surveillance required.*  ? ?For questions or updates, please contact [place] ?Please consult [email or web address] for contact info under  ? ? ? ?History of Present Illness:  ? ?33F with HFrEF 35-40%, paroxysmal AF/AFL on apixaban, RBBB, and HTN presented to [facility] on 5/11 for recurrent syncope. She has been having syncope since the start of the pandemic. When standing up from seated position, she becomes lightheaded. If she sits down she can avoid passing out. She has never passed out sitting. She did fall multiple times and broke her fibula. No chest pain, dyspnea, orthopnea, palpitations. She presented to outside hospital for recurrent episode of syncope and was found to be in AF with RVR. Troponin negative. EKG shows AF with RBBB. She was started on IV amiodarone and transferred here for CMR and EP evaluation. ? ? ?Past Medical History:  ?Diagnosis Date  ? 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 CKD (chronic kidney disease), stage II   ? Colon cancer ([place])   ? Depression   ? Dysphagia   ? Headache   ? migraine  ? Hx of adenomatous colonic polyps   ? Hyperlipidemia   ? Hypertension   ? PAF (  paroxysmal atrial fibrillation) ([place])   ? Paroxysmal atrial fibrillation ([facility])   ? Skin cancer   ? Syncope   ? ? ?Past Surgical History:  ?Procedure Laterality Date  ? ABDOMINAL HYSTERECTOMY    ? with removal of tubes and ovaries  ? COLONOSCOPY    ? COLONOSCOPY WITH PROPOFOL N/A 09/03/2017  ? Procedure: COLONOSCOPY WITH PROPOFOL;  Surgeon: Manya Silvas, MD;  Location: [facility] ENDOSCOPY;  Service: Endoscopy;  Laterality: N/A;  ? COLONOSCOPY WITH PROPOFOL N/A 09/13/2020  ? Procedure: COLONOSCOPY WITH PROPOFOL;   Surgeon: Lesly Rubenstein, MD;  Location: [facility] ENDOSCOPY;  Service: Endoscopy;  Laterality: N/A;  ? DILATION AND CURETTAGE OF UTERUS    ? ESOPHAGOGASTRODUODENOSCOPY    ? ESOPHAGOGASTRODUODENOSCOPY (EGD) WITH PROPOFOL N/A 09/13/2020  ? Procedure: ESOPHAGOGASTRODUODENOSCOPY (EGD) WITH PROPOFOL;  Surgeon: Lesly Rubenstein, MD;  Location: [facility] ENDOSCOPY;  Service: Endoscopy;  Laterality: N/A;  ? skin cancer removal    ? TONSILLECTOMY    ? TUBAL LIGATION    ?  ? ?Medications Prior to Admission: ?Prior to Admission medications   ?Medication Sig Start Date End Date Taking? Authorizing Provider  ?acetaminophen (TYLENOL) 500 MG tablet Acetaminophen Extra Strength 500 mg tablet ? Take 1 tablet every 4 hours by oral route.    [provider]  ?apixaban (ELIQUIS) 5 MG TABS tablet Take 5 mg by mouth 2 (two) times daily.    [provider]  ?carvedilol (COREG) 12.5 MG tablet Take 12.5 mg by mouth 2 (two) times daily with a meal. ?Patient not taking: Reported on 03/23/2022    [provider]  ?carvedilol (COREG) 25 MG tablet Take 25 mg by mouth 2 (two) times daily. 02/17/22   [provider]  ?diltiazem (CARDIZEM CD) 240 MG 24 hr capsule Take 1 capsule (240 mg total) by mouth daily. 07/08/19   Bettey Costa, MD  ?escitalopram (LEXAPRO) 10 MG tablet Take 10 mg by mouth daily. ?Patient not taking: Reported on 03/23/2022 07/01/19   [provider]  ?estradiol (ESTRACE) 0.5 MG tablet Take 0.5 mg by mouth daily.    [provider]  ?furosemide (LASIX) 20 MG tablet Take 1 tablet (20 mg) by mouth once a day x 3 days as directed 03/23/22   Deboraha Sprang, MD  ?hydrOXYzine (ATARAX) 25 MG tablet Take 25 mg by mouth 2 (two) times daily as needed. 01/11/22   [provider]  ?LORazepam (ATIVAN) 1 MG tablet Take 1 tablet (1 mg total) by mouth every 6 (six) hours as needed for anxiety. ?Patient not taking: Reported on 04/14/2022 07/07/19   Bettey Costa, MD  ?mometasone (NASONEX) 50 MCG/ACT  nasal spray Place 2 sprays into the nose daily as needed.     [provider]  ?venlafaxine XR (EFFEXOR-XR) 150 MG 24 hr capsule Take 150 mg by mouth daily. 01/20/22   [provider]  ?  ? ?Allergies:    ?Allergies  ?Allergen Reactions  ? Desyrel [Trazodone] Rash  ? Sulfa Antibiotics Rash  ? ? ?Social History:   ?Social History  ? ?Socioeconomic History  ? Marital status: Widowed  ?  Spouse name: Not on file  ? Number of children: Not on file  ? Years of education: Not on file  ? Highest education level: Not on file  ?Occupational History  ? Not on file  ?Tobacco Use  ? Smoking status: Never  ? Smokeless tobacco: Never  ?Vaping Use  ? Vaping Use: Never used  ?Substance and Sexual Activity  ?  Alcohol use: Yes  ? Drug use: No  ? Sexual activity: Not on file  ?Other Topics Concern  ? Not on file  ?Social History Narrative  ? Not on file  ? ?Social Determinants of Health  ? ?Financial Resource Strain: Not on file  ?Food Insecurity: Not on file  ?Transportation Needs: Not on file  ?Physical Activity: Not on file  ?Stress: Not on file  ?Social Connections: Not on file  ?Intimate Partner Violence: Not on file  ?  ?Family History:   ?The patient's family history is negative for Breast cancer.   ? ?ROS:  ?Please see the history of present illness. All other ROS reviewed and negative.    ? ?Physical Exam/Data:  ?There were no vitals filed for this visit. ?No intake or output data in the 24 hours ending 04/15/22 2230 ? ?  04/14/2022  ?  8:00 PM 03/23/2022  ? 11:21 AM 09/13/2020  ?  7:57 AM  ?Last 3 Weights  ?Weight (lbs) 181 lb 177 lb 175 lb  ?Weight (kg) 82.1 kg 80.287 kg 79.379 kg  ?   ?There is no height or weight on file to calculate BMI.  ?General:  No acute distress ?HEENT: normal ?Neck: no JVD ?Vascular: Distal pulses 2+ bilaterally   ?Cardiac:  normal S1, S2; RRR; no murmur  ?Lungs:  clear to auscultation bilaterally, no wheezing, rhonchi or rales  ?Abd: soft, nontender, no hepatomegaly  ?Ext: no  edema ?Musculoskeletal:  No deformities ?Skin: warm and dry  ?Neuro:  AOx3 ?Psych:  Normal affect  ? ?Laboratory Data: ? ?High Sensitivity Troponin:   ?Recent Labs  ?Lab 04/13/22 ?2037  ?TROPONINIHS 3  <2  ?

## 2022-04-15 NOTE — H&P (Signed)
? ?History & Physical  ?  ?Patient ID: Caroline Zamora ?MRN: [id number], DOB/AGE: 79-Apr-1942  ? ?Admit date: 04/15/2022  ? ? ?Primary Physician: Kirk Ruths, MD ?Primary Cardiologist: A. Josefa Half, MD; EPOlin Pia, MD ? ?Patient Profile  ?  ?81 y/o ? w/a h/o PAF/flutter, recurrent syncope, colon cancer, depression, CKD II, HTN, HL, PVCs, and migraines, who presents on transfer from [facility] in the setting of recurrent AFib, syncope, PVCs, and cardiomyopathy. ? ?Past Medical History  ? ? ?Past Medical History:  ?Diagnosis Date  ? 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 CKD (chronic kidney disease), stage II   ? Colon cancer ([place])   ? Depression   ? Dysphagia   ? Headache   ? migraine  ? Hx of adenomatous colonic polyps   ? Hyperlipidemia   ? Hypertension   ? PAF (paroxysmal atrial fibrillation) ([place])   ? Paroxysmal atrial fibrillation ([facility])   ? Skin cancer   ? Syncope   ?  ?Past Surgical History:  ?Procedure Laterality Date  ? ABDOMINAL HYSTERECTOMY    ? with removal of tubes and ovaries  ? COLONOSCOPY    ? COLONOSCOPY WITH PROPOFOL N/A 09/03/2017  ? Procedure: COLONOSCOPY WITH PROPOFOL;  Surgeon: Manya Silvas, MD;  Location: [facility] ENDOSCOPY;  Service: Endoscopy;  Laterality: N/A;  ? COLONOSCOPY WITH PROPOFOL N/A 09/13/2020  ? Procedure: COLONOSCOPY WITH PROPOFOL;  Surgeon: Lesly Rubenstein, MD;  Location: [facility] ENDOSCOPY;  Service: Endoscopy;  Laterality: N/A;  ? DILATION AND CURETTAGE OF UTERUS    ? ESOPHAGOGASTRODUODENOSCOPY    ? ESOPHAGOGASTRODUODENOSCOPY (EGD) WITH PROPOFOL N/A 09/13/2020  ? Procedure: ESOPHAGOGASTRODUODENOSCOPY (EGD) WITH PROPOFOL;  Surgeon: Lesly Rubenstein, MD;  Location: [facility] ENDOSCOPY;  Service: Endoscopy;  Laterality: N/A;  ? skin cancer removal    ? TONSILLECTOMY    ? TUBAL LIGATION    ?  ? ?Allergies ? ?Allergies  ?Allergen Reactions  ? Desyrel [Trazodone] Rash  ? Sulfa Antibiotics Rash  ? ? ?History of Present Illness  ?  ?81 y/o ? w/a h/o PAF/flutter, recurrent syncope, colon  cancer, depression, CKD II, HTN, HL, PVCs, and migraines.  She was seen by Dr. Caryl Comes in clinic on March 23, 2022.  At that appointment she described 3 years of abrupt syncope.  Syncopal episodes typically last 5 to 10 seconds.  She has had multiple injuries including a fractured leg.  Baseline EKG shows right bundle branch block and frequent PVCs that are left bundle with an inferior axis.  Assessments of her left ventricular function have varied from 35% to 60%.  At the appointment with Dr. Caryl Comes he was concerned about arrhythmic syncope given her history.  He discussed defibrillator therapy but wanted to get a cardiac MRI to better assess for evidence of structural heart disease.  The thought was that if the EF was still low he would proceed with defibrillator but if the EF was normal he would proceed with loop recorder implant.  Echo subsequently showed an EF of 35-40% w/ grade I diast dysfxn and mild MR. ? ?Unfortunately, over the past 3 wks, she has had multiple presyncopal and syncopal events assoc w/ falls.  She had 2 falls/syncopal spells preceded by dizziness and shaking of her hands, similar to prior experiences.  She presented to the ED on 5/11 where she was found to be in rapid Afib.  She was treated w/ IV dilt and subsequently IV amio w/ conversion to sinus rhythm, however she later reverted to  afib, which has been paroxysmal since admission to [facility].  She was seen by Dr. Quentin Ore this AM w/ rec to tx to [facility] for further EP eval, cMRI, and ICD placement. ? ?Home Medications  ?  ?Prior to Admission medications   ?Medication Sig Start Date End Date Taking? Authorizing Provider  ?acetaminophen (TYLENOL) 500 MG tablet Acetaminophen Extra Strength 500 mg tablet ? Take 1 tablet every 4 hours by oral route.    [provider]  ?apixaban (ELIQUIS) 5 MG TABS tablet Take 5 mg by mouth 2 (two) times daily.    [provider]  ?carvedilol (COREG) 12.5 MG tablet Take 12.5 mg by mouth 2 (two) times  daily with a meal. ?Patient not taking: Reported on 03/23/2022    [provider]  ?carvedilol (COREG) 25 MG tablet Take 25 mg by mouth 2 (two) times daily. 02/17/22   [provider]  ?diltiazem (CARDIZEM CD) 240 MG 24 hr capsule Take 1 capsule (240 mg total) by mouth daily. 07/08/19   Bettey Costa, MD  ?escitalopram (LEXAPRO) 10 MG tablet Take 10 mg by mouth daily. ?Patient not taking: Reported on 03/23/2022 07/01/19   [provider]  ?estradiol (ESTRACE) 0.5 MG tablet Take 0.5 mg by mouth daily.    [provider]  ?furosemide (LASIX) 20 MG tablet Take 1 tablet (20 mg) by mouth once a day x 3 days as directed 03/23/22   Deboraha Sprang, MD  ?hydrOXYzine (ATARAX) 25 MG tablet Take 25 mg by mouth 2 (two) times daily as needed. 01/11/22   [provider]  ?LORazepam (ATIVAN) 1 MG tablet Take 1 tablet (1 mg total) by mouth every 6 (six) hours as needed for anxiety. ?Patient not taking: Reported on 04/14/2022 07/07/19   Bettey Costa, MD  ?mometasone (NASONEX) 50 MCG/ACT nasal spray Place 2 sprays into the nose daily as needed.     [provider]  ?venlafaxine XR (EFFEXOR-XR) 150 MG 24 hr capsule Take 150 mg by mouth daily. 01/20/22   [provider]  ? ? ?Family History  ?  ?Family History  ?Problem Relation Age of Onset  ? Breast cancer Neg Hx   ? ?She indicated that the status of her neg hx is unknown. ? ? ?Social History  ?  ?Social History  ? ?Socioeconomic History  ? Marital status: Widowed  ?  Spouse name: Not on file  ? Number of children: Not on file  ? Years of education: Not on file  ? Highest education level: Not on file  ?Occupational History  ? Not on file  ?Tobacco Use  ? Smoking status: Never  ? Smokeless tobacco: Never  ?Vaping Use  ? Vaping Use: Never used  ?Substance and Sexual Activity  ? Alcohol use: Yes  ? Drug use: No  ? Sexual activity: Not on file  ?Other Topics Concern  ? Not on file  ?Social History Narrative  ? Not on file  ? ?Social  Determinants of Health  ? ?Financial Resource Strain: Not on file  ?Food Insecurity: Not on file  ?Transportation Needs: Not on file  ?Physical Activity: Not on file  ?Stress: Not on file  ?Social Connections: Not on file  ?Intimate Partner Violence: Not on file  ?  ? ?Review of Systems  ?  ?General:  No chills, fever, night sweats or weight changes.  ?Cardiovascular:  +++ presyncope/syncope. No chest pain, dyspnea on exertion, edema, orthopnea, palpitations, paroxysmal nocturnal dyspnea. ?Dermatological: No rash, lesions/masses ?Respiratory: No  cough, dyspnea ?Urologic: No hematuria, dysuria ?Abdominal:   No nausea, vomiting, diarrhea, bright red blood per rectum, melena, or hematemesis ?Neurologic:  No visual changes, wkns, changes in mental status. ?All other systems reviewed and are otherwise negative except as noted above. ? ?Physical Exam  ?  ?98.1, 90, 22, 153/99,  , 96% RA ?General: Pleasant, NAD ?Psych: Normal affect. ?Neuro: Alert and oriented X 3. Moves all extremities spontaneously. ?HEENT: Normal  ?Neck: Supple without bruits or JVD. ?Lungs:  Resp regular and unlabored, CTA. ?Heart: IR, IR, no s3, s4, or murmurs. ?Abdomen: Soft, non-tender, non-distended, BS + x 4.  ?Extremities: No clubbing, cyanosis or edema. DP/PT 2+, Radials 2+ and equal bilaterally. ? ?Labs  ?  ?Cardiac Enzymes ?Recent Labs  ?Lab 04/13/22 ?2037  ?TROPONINIHS 3  <2  ?   ? ?Lab Results  ?Component Value Date  ? WBC 8.0 04/15/2022  ? HGB 12.7 04/15/2022  ? HCT 39.8 04/15/2022  ? MCV 85.6 04/15/2022  ? PLT 270 04/15/2022  ?  ?Recent Labs  ?Lab 04/14/22 ?0438 04/14/22 ?0853 04/15/22 ?0503  ?NA 139  --  138  ?K 6.2*   < > 3.8  ?CL 118*  --  107  ?CO2 21*  --  26  ?BUN 12  --  10  ?CREATININE 0.64  --  0.86  ?CALCIUM 6.4*  --  8.4*  ?PROT 4.8*  --   --   ?BILITOT 0.4  --   --   ?ALKPHOS 57  --   --   ?ALT 8  --   --   ?AST 13*  --   --   ?GLUCOSE 116*  --  115*  ? < > = values in this interval not displayed.  ? ?BNP ?   ?Component Value  Date/Time  ? BNP 251.6 (H) 04/15/2022 0537  ?  ?Radiology Studies  ?  ?CT Head Wo Contrast ? ?Result Date: 04/13/2022 ?CLINICAL DATA:  Fall with posterior head impact, dizziness, chronic anticoagulation. Mild parenchy

## 2022-04-15 NOTE — Progress Notes (Signed)
Patient stable. HR 90s and now in NSR. Will transition back and forth from NSR to Afib. HR as high as 160-170s at times. Alert and oriented x4, belonging packed and travel with patient, report given to [facility], and receiving unit [facility] called and notified that she was on the way. Son called and given information. Transported via Biomedical scientist by 2 [employer]  Staff members. Amiodarone infusing.  ?

## 2022-04-15 NOTE — Progress Notes (Signed)
Heart rate back up to 120-140, Sustaining. Made Dr. Sheppard Coil and Dr Sharolyn Douglas aware.  ?

## 2022-04-15 NOTE — Consult Note (Signed)
?Cardiology Consultation:  ? ?Patient ID: Caroline Zamora ?MRN: [id number]; DOB: Feb 05, 1941 ? ?Admit date: 04/13/2022 ?Date of Consult: 04/15/2022 ? ?PCP:  Caroline Ruths, MD ?  ?[facility] Providers ?Cardiologist: Caroline Nap, MD ? ? ?Patient Profile:  ? ?Caroline Zamora is a 81 y.o. female with a hx of multiple syncopal episodes who is being seen 04/15/2022 for the evaluation of syncope at the request of Caroline Zamora. ? ?History of Present Illness:  ? ?Caroline Zamora atrial fibrillation and flutter, colon cancer, depression, migraine headaches.  She was seen by Caroline Zamora in clinic on March 23, 2022.  At that appointment she described 3 years of abrupt syncope.  Syncopal episodes typically last 5 to 10 seconds.  She has had multiple injuries including a fractured leg.  Baseline EKG shows right bundle branch block and frequent PVCs that are left bundle with an inferior axis.  Assessments of her left ventricular function have varied from 35% to 60%.  At the appointment with Caroline Zamora he was concerned about arrhythmic syncope given her history.  He discussed defibrillator therapy but wanted to get a cardiac MRI to better assess for evidence of structural heart disease.  The thought was that if the EF was still low he would proceed with defibrillator but if the EF was normal he would proceed with loop recorder implant. ? ?This admission was prompted by syncope when trying to get up from a seated position.  Lab work was unrevealing.  She has been in and out of atrial fibrillation while inpatient. ? ? ? ?Past Medical History:  ?Diagnosis Date  ? AF (atrial fibrillation) ([place])   ? 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 Chronic kidney disease   ? Colon cancer ([place])   ? Depression   ? Dysphagia   ? Headache   ? migraine  ? Hx of adenomatous colonic polyps   ? Hyperlipidemia   ? Hypertension   ? Paroxysmal atrial fibrillation ([facility])   ? Skin cancer   ? ? ?Past Surgical History:  ?Procedure Laterality Date  ? ABDOMINAL  HYSTERECTOMY    ? with removal of tubes and ovaries  ? COLONOSCOPY    ? COLONOSCOPY WITH PROPOFOL N/A 09/03/2017  ? Procedure: COLONOSCOPY WITH PROPOFOL;  Surgeon: Caroline Silvas, MD;  Location: [facility] ENDOSCOPY;  Service: Endoscopy;  Laterality: N/A;  ? COLONOSCOPY WITH PROPOFOL N/A 09/13/2020  ? Procedure: COLONOSCOPY WITH PROPOFOL;  Surgeon: Caroline Rubenstein, MD;  Location: [facility] ENDOSCOPY;  Service: Endoscopy;  Laterality: N/A;  ? DILATION AND CURETTAGE OF UTERUS    ? ESOPHAGOGASTRODUODENOSCOPY    ? ESOPHAGOGASTRODUODENOSCOPY (EGD) WITH PROPOFOL N/A 09/13/2020  ? Procedure: ESOPHAGOGASTRODUODENOSCOPY (EGD) WITH PROPOFOL;  Surgeon: Caroline Rubenstein, MD;  Location: [facility] ENDOSCOPY;  Service: Endoscopy;  Laterality: N/A;  ? skin cancer removal    ? TONSILLECTOMY    ? TUBAL LIGATION    ?  ? ? ? ?Inpatient Medications: ?Scheduled Meds: ? apixaban  5 mg Oral BID  ? calcium-vitamin D  1 tablet Oral TID  ? carvedilol  12.5 mg Oral BID  ? estradiol  0.5 mg Oral QHS  ? furosemide  20 mg Oral Daily  ? venlafaxine XR  150 mg Oral Daily  ? ?Continuous Infusions: ? amiodarone 30 mg/hr (04/15/22 0302)  ? ?PRN Meds: ?acetaminophen **OR** acetaminophen, fluticasone, hydrOXYzine, LORazepam, magnesium hydroxide, melatonin, ondansetron **OR** ondansetron (ZOFRAN) IV ? ?Allergies:    ?Allergies  ?Allergen Reactions  ? Desyrel [Trazodone] Rash  ? Sulfa  Antibiotics Rash  ? ? ?Social History:   ?Social History  ? ?Socioeconomic History  ? Marital status: Widowed  ?  Spouse name: Not on file  ? Number of children: Not on file  ? Years of education: Not on file  ? Highest education level: Not on file  ?Occupational History  ? Not on file  ?Tobacco Use  ? Smoking status: Never  ? Smokeless tobacco: Never  ?Vaping Use  ? Vaping Use: Never used  ?Substance and Sexual Activity  ? Alcohol use: Yes  ? Drug use: No  ? Sexual activity: Not on file  ?Other Topics Concern  ? Not on file  ?Social History Narrative  ? Not on file  ? ?Social  Determinants of Health  ? ?Financial Resource Strain: Not on file  ?Food Insecurity: Not on file  ?Transportation Needs: Not on file  ?Physical Activity: Not on file  ?Stress: Not on file  ?Social Connections: Not on file  ?Intimate Partner Violence: Not on file  ?  ?Family History:   ? ?Family History  ?Problem Relation Age of Onset  ? Breast cancer Neg Hx   ?  ? ?ROS:  ?Please see the history of present illness.  ? ?All other ROS reviewed and negative.    ? ?Physical Exam/Data:  ? ?Vitals:  ? 04/15/22 0645 04/15/22 0700 04/15/22 0800 04/15/22 0900  ?BP: (!) 134/99 (!) 141/102 (!) 144/108 (!) 153/99  ?Pulse:      ?Resp: 19 (!) 22 (!) 22 (!) 23  ?Temp: 98 ?F (36.7 ?C) 98.1 ?F (36.7 ?C)    ?TempSrc: Oral Oral    ?SpO2: 96% 96%    ?Weight:      ?Height:      ? ? ?Intake/Output Summary (Last 24 hours) at 04/15/2022 1009 ?Last data filed at 04/15/2022 1962 ?Gross per 24 hour  ?Intake --  ?Output 200 ml  ?Net -200 ml  ? ? ?  04/14/2022  ?  8:00 PM 03/23/2022  ? 11:21 AM 09/13/2020  ?  7:57 AM  ?Last 3 Weights  ?Weight (lbs) 181 lb 177 lb 175 lb  ?Weight (kg) 82.1 kg 80.287 kg 79.379 kg  ?   ?Body mass index is 29.21 kg/m?.  ?General: Elderly, chronically ill-appearing ?HEENT: normal ?Neck: no JVD ?Vascular: No carotid bruits; Distal pulses 2+ bilaterally ?Cardiac:  normal S1, S2; irregular rhythm; no murmur  ?Lungs:  clear to auscultation bilaterally, no wheezing, rhonchi or rales  ?Abd: soft, nontender, no hepatomegaly  ?Ext: no edema ?Musculoskeletal:  No deformities, BUE and BLE strength normal and equal ?Skin: warm and dry  ?Neuro:  CNs 2-12 intact, no focal abnormalities noted ?Psych:  Normal affect  ? ?EKG:  The EKG was personally reviewed and demonstrates:   ?Apr 13, 2022 EKG personally reviewed shows atrial flutter with 2 1 AV conduction and a right bundle-branch block.  No PVCs on that EKG. ? ?Telemetry:  Telemetry was personally reviewed and demonstrates: Sinus rhythm with frequent monomorphic PVCs.  Bigeminal PVCs  during my interview. ? ?Relevant CV Studies: ? ?Apr 10, 2022 echo shows moderately reduced left ventricular function, 35 to 40%.  Global hypokinesis.  Right ventricular function is normal ?Mild MR ? ? ? ? ?Laboratory Data: ? ?High Sensitivity Troponin:   ?Recent Labs  ?Lab 04/13/22 ?2037  ?TROPONINIHS 3  <2  ?   ?Chemistry ?Recent Labs  ?Lab 04/13/22 ?2037 04/14/22 ?2297 04/14/22 ?9892 04/15/22 ?0503  ?NA 136 139  --  138  ?K  3.7 6.2* 3.7 3.8  ?CL 106 118*  --  107  ?CO2 23 21*  --  26  ?GLUCOSE 126* 116*  --  115*  ?BUN 14 12  --  10  ?CREATININE 0.82 0.64  --  0.86  ?CALCIUM 7.9* 6.4*  --  8.4*  ?MG  --  1.7  --   --   ?GFRNONAA >60 >60  --  >60  ?ANIONGAP 7 0*  --  5  ?  ?Recent Labs  ?Lab 04/14/22 ?6962  ?PROT 4.8*  ?ALBUMIN 2.1*  ?AST 13*  ?ALT 8  ?ALKPHOS 57  ?BILITOT 0.4  ? ?Lipids No results for input(s): CHOL, TRIG, HDL, LABVLDL, LDLCALC, CHOLHDL in the last 168 hours.  ?Hematology ?Recent Labs  ?Lab 04/13/22 ?2037 04/14/22 ?0438 04/15/22 ?0503  ?WBC 10.2 7.4 8.0  ?RBC 5.00 4.19 4.65  ?HGB 13.8 11.4* 12.7  ?HCT 41.9 35.9* 39.8  ?MCV 83.8 85.7 85.6  ?MCH 27.6 27.2 27.3  ?MCHC 32.9 31.8 31.9  ?RDW 13.9 14.1 13.9  ?PLT 326 237 270  ? ?Thyroid No results for input(s): TSH, FREET4 in the last 168 hours.  ?BNP ?Recent Labs  ?Lab 04/15/22 ?9528  ?BNP 251.6*  ?  ?DDimer No results for input(s): DDIMER in the last 168 hours. ? ? ?Radiology/Studies:  ?CT Head Wo Contrast ? ?Result Date: 04/13/2022 ?CLINICAL DATA:  Fall with posterior head impact, dizziness, chronic anticoagulation. Mild parenchymal volume loss EXAM: CT HEAD WITHOUT CONTRAST CT CERVICAL SPINE WITHOUT CONTRAST TECHNIQUE: Multidetector CT imaging of the head and cervical spine was performed following the standard protocol without intravenous contrast. Multiplanar CT image reconstructions of the cervical spine were also generated. RADIATION DOSE REDUCTION: This exam was performed according to the departmental dose-optimization program which includes  automated exposure control, adjustment of the mA and/or kV according to patient size and/or use of iterative reconstruction technique. COMPARISON:  None Available. FINDINGS: CT HEAD FINDINGS Brain: Normal anatomic conf

## 2022-04-15 NOTE — Progress Notes (Signed)
?   04/15/22 1700  ?Assess: MEWS Score  ?BP (!) 132/93  ?ECG Heart Rate (!) 138  ?Resp (!) 27  ?Assess: MEWS Score  ?MEWS Temp 0  ?MEWS Systolic 0  ?MEWS Pulse 3  ?MEWS RR 2  ?MEWS LOC 0  ?MEWS Score 5  ?MEWS Score Color Red  ?Assess: if the MEWS score is Yellow or Red  ?Were vital signs taken at a resting state? Yes  ?Focused Assessment No change from prior assessment  ?Does the patient meet 2 or more of the SIRS criteria? No  ?MEWS guidelines implemented *See Row Information* Yes  ?Escalate  ?MEWS: Escalate Red: discuss with charge nurse/RN and provider, consider discussing with RRT  ?Notify: Charge Nurse/RN  ?Name of Charge Nurse/RN Notified JULISA  ?Date Charge Nurse/RN Notified 04/15/22  ?Time Charge Nurse/RN Notified 1725  ?Notify: Provider  ?Provider Name/Title Dr. Quentin Ore  ?Date Provider Notified 04/15/22  ?Time Provider Notified 1725  ?Method of Notification Page  ?Notification Reason Change in status  ?Document  ?Progress note created (see row info) Yes  ?Assess: SIRS CRITERIA  ?SIRS Temperature  0  ?SIRS Pulse 1  ?SIRS Respirations  1  ?SIRS WBC 0  ?SIRS Score Sum  2  ? ? ?

## 2022-04-15 NOTE — Progress Notes (Signed)
Sent Dr. Quentin Ore page regarding patients heart rate sustaining 130-140. QTC 580.  ?

## 2022-04-15 NOTE — Progress Notes (Signed)
?   04/15/22 0900  ?Assess: MEWS Score  ?BP (!) 153/99  ?ECG Heart Rate (!) 127  ?Resp (!) 23  ?Assess: MEWS Score  ?MEWS Temp 0  ?MEWS Systolic 0  ?MEWS Pulse 2  ?MEWS RR 1  ?MEWS LOC 0  ?MEWS Score 3  ?MEWS Score Color Yellow  ?Assess: if the MEWS score is Yellow or Red  ?Were vital signs taken at a resting state? Yes  ?Focused Assessment No change from prior assessment  ?Does the patient meet 2 or more of the SIRS criteria? No  ?MEWS guidelines implemented *See Row Information* No, previously yellow, continue vital signs every 4 hours  ?Escalate  ?MEWS: Escalate Yellow: discuss with charge nurse/RN and consider discussing with provider and RRT  ?Notify: Charge Nurse/RN  ?Name of Charge Nurse/RN Notified julisa  ?Date Charge Nurse/RN Notified 04/15/22  ?Time Charge Nurse/RN Notified [phone]  ?Notify: Provider  ?Provider Name/Title Ouida Sills and [facility]chos  ?Date Provider Notified 04/15/22  ?Time Provider Notified [phone]  ?Method of Notification Page  ?Notification Reason Other (Comment)  ?Document  ?Progress note created (see row info) Yes  ?Assess: SIRS CRITERIA  ?SIRS Temperature  0  ?SIRS Pulse 1  ?SIRS Respirations  1  ?SIRS WBC 0  ?SIRS Score Sum  2  ? ?Made both Dr. Ouida Sills and Dr. Saralyn Pilar aware patients heart rate still ranging 120-130. QTC 554. Currently on amiodarone '@16'$ .7, scheduled coreg given. Bp  140's/100's ?

## 2022-04-15 NOTE — Progress Notes (Signed)
See discharge summary

## 2022-04-15 NOTE — Progress Notes (Signed)
Cross Cover ?Patient afibe with RVR on amiodarone now with rates 130's/  Previously sta+.rted on IV fluids. Patient without evidence of need of IV fluids.  ?Possible now in overload despite stable pressure ?Stop IV fluids ?

## 2022-04-15 NOTE — Discharge Summary (Signed)
Physician Discharge Summary  ?Caroline Zamora KKX:[id number] DOB: November 03, 1941 DOA: 04/13/2022 ? ?PCP: Kirk Ruths, MD ? ?Admit date: 04/13/2022 ?Discharge date: 04/15/2022 ? ?Admitted From: home ?Disposition:  transfer to [facility]  ? ?Recommendations for Follow-up:  ?Cardiology team  ? ?Home Health: n/a  ?Equipment/Devices: n/a ?Discharge Condition: stable  ?CODE STATUS: FULL  ?Diet recommendation: heart healthy, NPO pre-procedure  ? ? ? ?Brief Narrative & Hospital Course:  ?Patient presented to emergency department 04/13/2022, chief complaint dizziness/syncope.  Prior to presenting to ED, tried to stand up and passed out, hit back of her head.  Following with cardiology, is on Eliquis for history of A-fib, ongoing issues with dizziness/syncope and plan for "pacemaker placement" next month with Dr. Jens Som (EP).  No chest pain, palpitations. ?ED course and initial admission 04/13/2022: Found to be in A-fib with RVR.  CT head no concerns.  Initially given 10 mg IV Cardizem, started on Cardizem drip, received meclizine and 1 L bolus NS.  Amiodarone drip started and Cardizem tapered off.  Cardiology was consulted ?04/13/2022: persistent RVR.  Cardiology plan: continue amiodarone, restarted diltiazem, continue Eliquis  History of HFrEF, LVEF 35 to 40%, global hypokinesis, grade 1 diastolic dysfunction.  Curretnly euvolemic, compensated.  Continue home beta-blocker, Lasix. Will reach out to electrophysiology.  ?04/15/22: overnight increased HR (from review of chart was off diltiazem gtt as of 1730 yesterday), no symptoms, diltiazem injection x2 administered and HR reduced. Cardiology team planning for transfer to [facility]  ? ?Consultants:  ?Cardiology  ? ?Procedures:  ?none ? ? ? ? ? ?Discharge Diagnoses: ?Principal Problem: ?  Atrial fibrillation with rapid ventricular response ([place]) ?Active Problems: ?  Syncope and collapse ?  Depression ?  HFrEF (heart failure with reduced ejection fraction)  ([place]) ? ? ? ?Assessment & Plan: ?Atrial fibrillation with rapid ventricular response (HCC) ?Initially on diltiazem drip, requiring addition of amiodarone drip and titration off diltiazem yesterday however diltiazem had to be restarted yesterday, was able to come off last night then HR up again early this am. ?Cardiology following   ?Cardiology recs as of 05/12:  ?plan to continue amiodarone bolus for 24 hours, transition to p.o.   ?Restarted diltiazem, uptitrate for rate control, come off this as able.   ?Consider changing beta-blocker, continue Eliquis ?will reach out to electrophysiology to see if patient can be evaluated during this admission ?Transfer to [facility] for cardiac MRI and EP study vs ICD implant  ? ? ?Syncope and collapse ?Atrial fibrillation possibly tachy/brady causing orthostatic problems  ?Management per cardiology - transfer to [facility] pending  ? ?Depression ?continue Effexor XR and Lexapro. ? ?HFrEF (heart failure with reduced ejection fraction) ([place]) ?LVEF 35 to 40%, global hypokinesis, grade 1 diastolic dysfunction.   ?Appears euvolemic, compensated.   ?Continue home beta-blocker, Lasix. ?Cardiology following  ? ? ? ? ? ?Discharge Instructions ? ?Discharge Instructions   ? ? Amb referral to AFIB Clinic   Complete by: As directed ?  ? ?  ? ? ?Diet Orders (From admission, onward)  ? ?  Start     Ordered  ? 04/13/22 2346  Diet Heart Room service appropriate? Yes; Fluid consistency: Thin  Diet effective now       ?Question Answer Comment  ?Room service appropriate? Yes   ?Fluid consistency: Thin   ?  ? 04/13/22 2347  ? ?  ?  ? ?  ? ? ?Current Facility-Administered Medications (Endocrine & Metabolic):  ?  estradiol (ESTRACE) tablet 0.5 mg ? ? ?  Current Facility-Administered Medications (Cardiovascular):  ?  [COMPLETED] amiodarone (NEXTERONE) 1.8 mg/mL load via infusion 150 mg **IN FOLLOWED-BY LINKED GROUP WITH** [EXPIRED] amiodarone (NEXTERONE PREMIX) 360-4.14 MG/200ML-% (1.8 mg/mL) IV infusion **IN  FOLLOWED-BY LINKED GROUP WITH** amiodarone (NEXTERONE PREMIX) 360-4.14 MG/200ML-% (1.8 mg/mL) IV infusion ?  carvedilol (COREG) tablet 12.5 mg ?  furosemide (LASIX) tablet 20 mg ? ? ?Current Facility-Administered Medications (Respiratory):  ?  fluticasone (FLONASE) 50 MCG/ACT nasal spray 2 spray ? ? ?Current Facility-Administered Medications (Analgesics):  ?  acetaminophen (TYLENOL) tablet 650 mg **OR** acetaminophen (TYLENOL) suppository 650 mg ? ? ?Current Facility-Administered Medications (Hematological):  ?  apixaban (ELIQUIS) tablet 5 mg ? ? ?Current Facility-Administered Medications (Other):  ?  calcium-vitamin D (OSCAL WITH D) 500-5 MG-MCG per tablet 1 tablet ?  hydrOXYzine (ATARAX) tablet 25 mg ?  LORazepam (ATIVAN) tablet 1 mg ?  magnesium hydroxide (MILK OF MAGNESIA) suspension 30 mL ?  melatonin tablet 2.5 mg ?  ondansetron (ZOFRAN) tablet 4 mg **OR** ondansetron (ZOFRAN) injection 4 mg ?  venlafaxine XR (EFFEXOR-XR) 24 hr capsule 150 mg ? ?No current outpatient medications on file. ? ? ? ? ?Allergies  ?Allergen Reactions  ? Desyrel [Trazodone] Rash  ? Sulfa Antibiotics Rash  ? ? ? ?Subjective: patient feeling well, no palpitations at this time. No CP/SOB.  ? ? ?Discharge Exam: ?Vitals:  ? 04/15/22 1200 04/15/22 1300  ?BP: 125/62 122/89  ?Pulse:    ?Resp: (!) 22 (!) 24  ?Temp:  98.7 ?F (37.1 ?C)  ?SpO2:  97%  ? ?Vitals:  ? 04/15/22 1005 04/15/22 1100 04/15/22 1200 04/15/22 1300  ?BP: 94/65 132/80 125/62 122/89  ?Pulse:      ?Resp: (!) 24 (!) 21 (!) 22 (!) 24  ?Temp:    98.7 ?F (37.1 ?C)  ?TempSrc:    Oral  ?SpO2:    97%  ?Weight:      ?Height:      ? ? ?General: Pt is alert, awake, not in acute distress ?Cardiovascular: Reg rate, irreg irreg, S1/S2 +, no rubs, no gallops ?Respiratory: CTA bilaterally, no wheezing, no rhonchi ?Abdominal: Soft, NT, ND, bowel sounds + ?Extremities: no edema, no cyanosis ? ? ? ? ?The results of significant diagnostics from this hospitalization (including imaging,  microbiology, ancillary and laboratory) are listed below for reference.   ? ? ?Microbiology: ?No results found for this or any previous visit (from the past 240 hour(s)).  ? ?Labs: ?BNP (last 3 results) ?Recent Labs  ?  04/15/22 ?5329  ?BNP 251.6*  ? ?Basic Metabolic Panel: ?Recent Labs  ?Lab 04/13/22 ?2037 04/14/22 ?9242 04/14/22 ?6834 04/15/22 ?0503  ?NA 136 139  --  138  ?K 3.7 6.2* 3.7 3.8  ?CL 106 118*  --  107  ?CO2 23 21*  --  26  ?GLUCOSE 126* 116*  --  115*  ?BUN 14 12  --  10  ?CREATININE 0.82 0.64  --  0.86  ?CALCIUM 7.9* 6.4*  --  8.4*  ?MG  --  1.7  --   --   ? ?Liver Function Tests: ?Recent Labs  ?Lab 04/14/22 ?1962  ?AST 13*  ?ALT 8  ?ALKPHOS 57  ?BILITOT 0.4  ?PROT 4.8*  ?ALBUMIN 2.1*  ? ?No results for input(s): LIPASE, AMYLASE in the last 168 hours. ?No results for input(s): AMMONIA in the last 168 hours. ?CBC: ?Recent Labs  ?Lab 04/13/22 ?2037 04/14/22 ?0438 04/15/22 ?0503  ?WBC 10.2 7.4 8.0  ?NEUTROABS 7.3  --   --   ?  HGB 13.8 11.4* 12.7  ?HCT 41.9 35.9* 39.8  ?MCV 83.8 85.7 85.6  ?PLT 326 237 270  ? ?Cardiac Enzymes: ?No results for input(s): CKTOTAL, CKMB, CKMBINDEX, TROPONINI in the last 168 hours. ?BNP: ?Invalid input(s): POCBNP ?CBG: ?No results for input(s): GLUCAP in the last 168 hours. ?D-Dimer ?No results for input(s): DDIMER in the last 72 hours. ?Hgb A1c ?No results for input(s): HGBA1C in the last 72 hours. ?Lipid Profile ?No results for input(s): CHOL, HDL, LDLCALC, TRIG, CHOLHDL, LDLDIRECT in the last 72 hours. ?Thyroid function studies ?No results for input(s): TSH, T4TOTAL, T3FREE, THYROIDAB in the last 72 hours. ? ?Invalid input(s): FREET3 ?Anemia work up ?No results for input(s): VITAMINB12, FOLATE, FERRITIN, TIBC, IRON, RETICCTPCT in the last 72 hours. ?Urinalysis ?   ?Component Value Date/Time  ? COLORURINE YELLOW (A) 07/04/2019 1343  ? APPEARANCEUR HAZY (A) 07/04/2019 1343  ? LABSPEC 1.018 07/04/2019 1343  ? PHURINE 6.0 07/04/2019 1343  ? GLUCOSEU NEGATIVE 07/04/2019 1343  ?  HGBUR MODERATE (A) 07/04/2019 1343  ? [place] NEGATIVE 07/04/2019 1343  ? KETONESUR 20 (A) 07/04/2019 1343  ? PROTEINUR 30 (A) 07/04/2019 1343  ? NITRITE NEGATIVE 07/04/2019 1343  ? LEUKOCYTESUR TRACE (A) 07/04/2019 1343  ? ?

## 2022-04-15 NOTE — Progress Notes (Signed)
Patient with noted heart rate of 130s sustaining. Patient denies chest pain or SOB. Patient with SBP=130s. NP on call notified and orders given and implemented. Patient responded to meds with heart rate in 110s. ?

## 2022-04-15 NOTE — Progress Notes (Signed)
Report called to Financial risk analyst at [facility]. Updated Rn on patients status and reason for needing transport. Currently waiting on carelink to transport patient.  ?

## 2022-04-16 DIAGNOSIS — R55 Syncope and collapse: Secondary | ICD-10-CM

## 2022-04-16 LAB — LIPID PANEL
Cholesterol: 183 mg/dL (ref 0–200)
HDL: 54 mg/dL (ref >40)
LDL Cholesterol: 112 mg/dL — ABNORMAL HIGH (ref 0–99)
Total CHOL/HDL Ratio: 3.4 RATIO
Triglycerides: 85 mg/dL (ref <150)
VLDL: 17 mg/dL (ref 0–40)

## 2022-04-16 LAB — CBC
HCT: 39.6 % (ref 36.0–46.0)
Hemoglobin: 12.9 g/dL (ref 12.0–15.0)
MCH: 27.9 pg (ref 26.0–34.0)
MCHC: 32.6 g/dL (ref 30.0–36.0)
MCV: 85.7 fL (ref 80.0–100.0)
Platelets: 238 10*3/uL (ref 150–400)
RBC: 4.62 MIL/uL (ref 3.87–5.11)
RDW: 13.6 % (ref 11.5–15.5)
WBC: 6.6 10*3/uL (ref 4.0–10.5)
nRBC: 0 % (ref 0.0–0.2)

## 2022-04-16 LAB — BASIC METABOLIC PANEL
Anion gap: 8 (ref 5–15)
BUN: 7 mg/dL — ABNORMAL LOW (ref 8–23)
CO2: 28 mmol/L (ref 22–32)
Calcium: 8.3 mg/dL — ABNORMAL LOW (ref 8.9–10.3)
Chloride: 99 mmol/L (ref 98–111)
Creatinine, Ser: 0.95 mg/dL (ref 0.44–1.00)
GFR, Estimated: >60 mL/min (ref >60)
Glucose, Bld: 97 mg/dL (ref 70–99)
Potassium: 3.5 mmol/L (ref 3.5–5.1)
Sodium: 135 mmol/L (ref 135–145)

## 2022-04-16 LAB — IRON AND TIBC
Iron: 22 ug/dL — ABNORMAL LOW (ref 28–170)
Saturation Ratios: 12 % (ref 10.4–31.8)
TIBC: 182 ug/dL — ABNORMAL LOW (ref 250–450)
UIBC: 160 ug/dL

## 2022-04-16 LAB — HEMOGLOBIN A1C
Hgb A1c MFr Bld: 5.7 % — ABNORMAL HIGH (ref 4.8–5.6)
Mean Plasma Glucose: 116.89 mg/dL

## 2022-04-16 LAB — FERRITIN: Ferritin: 193 ng/mL (ref 11–307)

## 2022-04-16 LAB — TSH: TSH: 7.366 u[IU]/mL — ABNORMAL HIGH (ref 0.350–4.500)

## 2022-04-16 MED ORDER — ACETAMINOPHEN 325 MG PO TABS
ORAL_TABLET | ORAL | Status: AC
  Administered 2022-04-16: 650 mg via ORAL
  Filled 2022-04-16: qty 2

## 2022-04-16 MED ORDER — ASPIRIN 81 MG PO CHEW
81.0000 mg | CHEWABLE_TABLET | ORAL | Status: AC
  Administered 2022-04-17: 81 mg via ORAL
  Filled 2022-04-16: qty 1

## 2022-04-16 MED ORDER — SODIUM CHLORIDE 0.9 % IV SOLN: INTRAVENOUS | Status: DC

## 2022-04-16 MED ORDER — ACETAMINOPHEN 325 MG PO TABS
650.0000 mg | ORAL_TABLET | Freq: Four times a day (QID) | ORAL | Status: DC | PRN
  Administered 2022-04-18 (×2): 650 mg via ORAL
  Filled 2022-04-16 (×2): qty 2

## 2022-04-16 MED ORDER — SODIUM CHLORIDE 0.9 % IV SOLN
250.0000 mL | INTRAVENOUS | Status: DC | PRN
Start: 2022-04-16 — End: 2022-04-17

## 2022-04-16 MED ORDER — AMIODARONE HCL IN DEXTROSE 360-4.14 MG/200ML-% IV SOLN
30.0000 mg/h | INTRAVENOUS | Status: DC
  Administered 2022-04-16 – 2022-04-18 (×3): 30 mg/h via INTRAVENOUS
  Filled 2022-04-16 (×4): qty 200

## 2022-04-16 MED ORDER — SODIUM CHLORIDE 0.9% FLUSH
3.0000 mL | Freq: Two times a day (BID) | INTRAVENOUS | Status: DC
  Administered 2022-04-17 – 2022-04-19 (×3): 3 mL via INTRAVENOUS

## 2022-04-16 MED ORDER — HEPARIN (PORCINE) 25000 UT/250ML-% IV SOLN
1450.0000 [IU]/h | INTRAVENOUS | Status: DC
  Administered 2022-04-16: 1150 [IU]/h via INTRAVENOUS
  Filled 2022-04-16: qty 250

## 2022-04-16 MED ORDER — SODIUM CHLORIDE 0.9% FLUSH: 3.0000 mL | INTRAVENOUS | Status: DC | PRN

## 2022-04-16 NOTE — Progress Notes (Signed)
Orthostatic BP's ordered for patient.  Patient does not want to stand up due to fear she will pass out.  She said she would rather wait until the morning to attempt to stand up to take her BP. ?

## 2022-04-16 NOTE — H&P (View-Only) (Signed)
? ?EP Progress Note ? ?Patient Name: Caroline Zamora ?Date of Encounter: 04/16/2022 ? ?[facility] Cardiologist: None  ? ?Subjective  ? ?NAEO. Transferred to [facility] overnight. In and out of AF.  ? ?Inpatient Medications  ?  ?Scheduled Meds: ? apixaban  5 mg Oral BID  ? calcium-vitamin D  1 tablet Oral TID  ? carvedilol  12.5 mg Oral BID WC  ? estradiol  0.5 mg Oral QHS  ? furosemide  20 mg Oral Daily  ? losartan  25 mg Oral Daily  ? venlafaxine XR  150 mg Oral Q breakfast  ? ?Continuous Infusions: ? amiodarone    ? ?PRN Meds: ?fluticasone, hydrOXYzine, LORazepam, melatonin, ondansetron (ZOFRAN) IV  ? ?Vital Signs  ?  ?Vitals:  ? 04/15/22 2230 04/15/22 2231 04/16/22 0426 04/16/22 0924  ?BP:  130/76 127/90 (!) 132/94  ?Pulse:    (!) 132  ?Resp:   20   ?Temp:  98.3 ?F (36.8 ?C) 98.7 ?F (37.1 ?C)   ?TempSrc:  Oral Oral   ?SpO2: 98%  96%   ? ?No intake or output data in the 24 hours ending 04/16/22 0936 ? ?  04/14/2022  ?  8:00 PM 03/23/2022  ? 11:21 AM 09/13/2020  ?  7:57 AM  ?Last 3 Weights  ?Weight (lbs) 181 lb 177 lb 175 lb  ?Weight (kg) 82.1 kg 80.287 kg 79.379 kg  ?   ? ?Telemetry  ?  ?AF with rates in the 130-140s. Periods of sinus rhythm in the 60s w PVCs - Personally Reviewed ? ?ECG  ?  ?Personally Reviewed ? ?Physical Exam  ? ?GEN: No acute distress.  In bed at 45 degrees. ?Neck: No JVD ?Cardiac: Tachycardic. Irregularly irregular. No murmurs, rubs, or gallops.  ?Respiratory: Clear to auscultation bilaterally. ?GI: Soft, nontender, non-distended  ?MS: No edema; No deformity. ?Neuro:  Nonfocal  ?Psych: Normal affect  ? ?Labs  ?  ?High Sensitivity Troponin:   ?Recent Labs  ?Lab 04/13/22 ?2037  ?TROPONINIHS 3  <2  ?   ?Chemistry ?Recent Labs  ?Lab 04/14/22 ?0438 04/14/22 ?0853 04/15/22 ?0503 04/16/22 ?0255  ?NA 139  --  138 135  ?K 6.2* 3.7 3.8 3.5  ?CL 118*  --  107 99  ?CO2 21*  --  26 28  ?GLUCOSE 116*  --  115* 97  ?BUN 12  --  10 7*  ?CREATININE 0.64  --  0.86 0.95  ?CALCIUM 6.4*  --  8.4* 8.3*  ?MG 1.7  --    --   --   ?PROT 4.8*  --   --   --   ?ALBUMIN 2.1*  --   --   --   ?AST 13*  --   --   --   ?ALT 8  --   --   --   ?ALKPHOS 57  --   --   --   ?BILITOT 0.4  --   --   --   ?GFRNONAA >60  --  >60 >60  ?ANIONGAP 0*  --  5 8  ?  ?Lipids  ?Recent Labs  ?Lab 04/16/22 ?0255  ?CHOL 183  ?TRIG 85  ?HDL 54  ?LDLCALC 112*  ?CHOLHDL 3.4  ?  ?Hematology ?Recent Labs  ?Lab 04/14/22 ?0438 04/15/22 ?0503 04/16/22 ?0255  ?WBC 7.4 8.0 6.6  ?RBC 4.19 4.65 4.62  ?HGB 11.4* 12.7 12.9  ?HCT 35.9* 39.8 39.6  ?MCV 85.7 85.6 85.7  ?MCH 27.2 27.3 27.9  ?MCHC 31.8 31.9 32.6  ?  RDW 14.1 13.9 13.6  ?PLT 237 270 238  ? ?Thyroid  ?Recent Labs  ?Lab 04/16/22 ?0255  ?TSH 7.366*  ?  ?BNP ?Recent Labs  ?Lab 04/15/22 ?8101  ?BNP 251.6*  ?  ?DDimer No results for input(s): DDIMER in the last 168 hours.  ? ?Radiology  ?  ?No results found. ? ?Cardiac Studies  ? ? ? ?Assessment & Plan  ?  ?Ms. Rosenau is a pleasant 81 year old woman with recurrent syncopal episodes with personal injury.  She has at least moderate left ventricular dysfunction with global hypokinesis.  I am concerned that her syncope may be arrhythmic.  We need to better assess her left ventricular function and for evidence of structural heart disease.  Treatment plan will depend on left ventricular function on cardiac MRI. ?  ?#Syncope ?Concerning for arrhythmic origin. ?Cardiac MRI ?LHC ?Continue telemetry monitoring ?Keep NPO after MN Sunday ? ?#Paroxysmal atrial fibrillation and flutter ?Continue Eliquis ?Continue Coreg ?Continue amiodarone, transition to oral after IV load completed ?  ?#Frequent PVCs ?Cardiac MRI as above. ? ?#Chronic systolic HF ?NYHA II-III ?Warm and euvolemic. ?Continue ARB ?Cont BB ?MRI and LHC planned ? ?For questions or updates, please contact [place] ?Please consult [email or web address] for contact info under  ? ?  ?   ?Signed, ?Vickie Epley, MD  ?04/16/2022, 9:36 AM   ? ?

## 2022-04-16 NOTE — Progress Notes (Signed)
Telemetry notified nurse patient has converted back to Afib with heart rates in the 130's.  Paged provider to make them aware.  ?

## 2022-04-16 NOTE — Progress Notes (Signed)
? ? ?  Asked to add for [facility] tomorrow. Reviewed with Dr. Quentin Ore as she did receive Eliquis this AM so will stop and bridge with Heparin as she can likely have a radial cath tomorrow afternoon. She did receive Lasix today and will hold for tomorrow. Will enter orders and add to the cath board.  ? ?Signed, ?Erma Heritage, PA-C ?04/16/2022, 10:42 AM ?Pager: [phone] ? ?

## 2022-04-16 NOTE — Progress Notes (Signed)
? ?EP Progress Note ? ?Patient Name: Caroline Zamora ?Date of Encounter: 04/16/2022 ? ?[facility] Cardiologist: None  ? ?Subjective  ? ?NAEO. Transferred to [facility] overnight. In and out of AF.  ? ?Inpatient Medications  ?  ?Scheduled Meds: ? apixaban  5 mg Oral BID  ? calcium-vitamin D  1 tablet Oral TID  ? carvedilol  12.5 mg Oral BID WC  ? estradiol  0.5 mg Oral QHS  ? furosemide  20 mg Oral Daily  ? losartan  25 mg Oral Daily  ? venlafaxine XR  150 mg Oral Q breakfast  ? ?Continuous Infusions: ? amiodarone    ? ?PRN Meds: ?fluticasone, hydrOXYzine, LORazepam, melatonin, ondansetron (ZOFRAN) IV  ? ?Vital Signs  ?  ?Vitals:  ? 04/15/22 2230 04/15/22 2231 04/16/22 0426 04/16/22 0924  ?BP:  130/76 127/90 (!) 132/94  ?Pulse:    (!) 132  ?Resp:   20   ?Temp:  98.3 ?F (36.8 ?C) 98.7 ?F (37.1 ?C)   ?TempSrc:  Oral Oral   ?SpO2: 98%  96%   ? ?No intake or output data in the 24 hours ending 04/16/22 0936 ? ?  04/14/2022  ?  8:00 PM 03/23/2022  ? 11:21 AM 09/13/2020  ?  7:57 AM  ?Last 3 Weights  ?Weight (lbs) 181 lb 177 lb 175 lb  ?Weight (kg) 82.1 kg 80.287 kg 79.379 kg  ?   ? ?Telemetry  ?  ?AF with rates in the 130-140s. Periods of sinus rhythm in the 60s w PVCs - Personally Reviewed ? ?ECG  ?  ?Personally Reviewed ? ?Physical Exam  ? ?GEN: No acute distress.  In bed at 45 degrees. ?Neck: No JVD ?Cardiac: Tachycardic. Irregularly irregular. No murmurs, rubs, or gallops.  ?Respiratory: Clear to auscultation bilaterally. ?GI: Soft, nontender, non-distended  ?MS: No edema; No deformity. ?Neuro:  Nonfocal  ?Psych: Normal affect  ? ?Labs  ?  ?High Sensitivity Troponin:   ?Recent Labs  ?Lab 04/13/22 ?2037  ?TROPONINIHS 3  <2  ?   ?Chemistry ?Recent Labs  ?Lab 04/14/22 ?0438 04/14/22 ?0853 04/15/22 ?0503 04/16/22 ?0255  ?NA 139  --  138 135  ?K 6.2* 3.7 3.8 3.5  ?CL 118*  --  107 99  ?CO2 21*  --  26 28  ?GLUCOSE 116*  --  115* 97  ?BUN 12  --  10 7*  ?CREATININE 0.64  --  0.86 0.95  ?CALCIUM 6.4*  --  8.4* 8.3*  ?MG 1.7  --    --   --   ?PROT 4.8*  --   --   --   ?ALBUMIN 2.1*  --   --   --   ?AST 13*  --   --   --   ?ALT 8  --   --   --   ?ALKPHOS 57  --   --   --   ?BILITOT 0.4  --   --   --   ?GFRNONAA >60  --  >60 >60  ?ANIONGAP 0*  --  5 8  ?  ?Lipids  ?Recent Labs  ?Lab 04/16/22 ?0255  ?CHOL 183  ?TRIG 85  ?HDL 54  ?LDLCALC 112*  ?CHOLHDL 3.4  ?  ?Hematology ?Recent Labs  ?Lab 04/14/22 ?0438 04/15/22 ?0503 04/16/22 ?0255  ?WBC 7.4 8.0 6.6  ?RBC 4.19 4.65 4.62  ?HGB 11.4* 12.7 12.9  ?HCT 35.9* 39.8 39.6  ?MCV 85.7 85.6 85.7  ?MCH 27.2 27.3 27.9  ?MCHC 31.8 31.9 32.6  ?  RDW 14.1 13.9 13.6  ?PLT 237 270 238  ? ?Thyroid  ?Recent Labs  ?Lab 04/16/22 ?0255  ?TSH 7.366*  ?  ?BNP ?Recent Labs  ?Lab 04/15/22 ?7591  ?BNP 251.6*  ?  ?DDimer No results for input(s): DDIMER in the last 168 hours.  ? ?Radiology  ?  ?No results found. ? ?Cardiac Studies  ? ? ? ?Assessment & Plan  ?  ?Caroline Zamora is a pleasant 81 year old woman with recurrent syncopal episodes with personal injury.  She has at least moderate left ventricular dysfunction with global hypokinesis.  I am concerned that her syncope may be arrhythmic.  We need to better assess her left ventricular function and for evidence of structural heart disease.  Treatment plan will depend on left ventricular function on cardiac MRI. ?  ?#Syncope ?Concerning for arrhythmic origin. ?Cardiac MRI ?LHC ?Continue telemetry monitoring ?Keep NPO after MN Sunday ? ?#Paroxysmal atrial fibrillation and flutter ?Continue Eliquis ?Continue Coreg ?Continue amiodarone, transition to oral after IV load completed ?  ?#Frequent PVCs ?Cardiac MRI as above. ? ?#Chronic systolic HF ?NYHA II-III ?Warm and euvolemic. ?Continue ARB ?Cont BB ?MRI and LHC planned ? ?For questions or updates, please contact [place] ?Please consult [email or web address] for contact info under  ? ?  ?   ?Signed, ?Vickie Epley, MD  ?04/16/2022, 9:36 AM   ? ?

## 2022-04-16 NOTE — Progress Notes (Signed)
ANTICOAGULATION CONSULT NOTE - Initial Consult ? ?Pharmacy Consult for IV heparin ?Indication: atrial fibrillation ? ?Allergies  ?Allergen Reactions  ? Desyrel [Trazodone] Rash  ? Sulfa Antibiotics Rash  ? ? ?Patient Measurements: ?  ?Heparin Dosing Weight: 76.5 kg ? ?Vital Signs: ?Temp: 98.7 ?F (37.1 ?C) (05/14 0426) ?Temp Source: Oral (05/14 0426) ?BP: 132/94 (05/14 0924) ?Pulse Rate: 132 (05/14 0924) ? ?Labs: ?Recent Labs  ?  04/13/22 ?2037 04/14/22 ?0438 04/15/22 ?0503 04/16/22 ?0255  ?HGB 13.8 11.4* 12.7 12.9  ?HCT 41.9 35.9* 39.8 39.6  ?PLT 326 237 270 238  ?CREATININE 0.82 0.64 0.86 0.95  ?TROPONINIHS 3  <2  --   --   --   ? ? ?Estimated Creatinine Clearance: 52.7 mL/min (by C-G formula based on SCr of 0.95 mg/dL). ? ? ?Medical History: ?Past Medical History:  ?Diagnosis Date  ? 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 CKD (chronic kidney disease), stage II   ? Colon cancer ([place])   ? Depression   ? Dysphagia   ? Headache   ? migraine  ? Hx of adenomatous colonic polyps   ? Hyperlipidemia   ? Hypertension   ? PAF (paroxysmal atrial fibrillation) ([place])   ? Paroxysmal atrial fibrillation (HCC)   ? Skin cancer   ? Syncope   ? ?Assessment: ?20 YOF presenting with recurrent syncopal episodes. Patient to go for radial cath tomorrow afternoon. Pharmacy asked to dose heparin. Patient on Eliquis for Afib PTA. Last dose 5/14 @ 0900. ? ?Hgb and platelets are within normal limits. Renal function appears to be at baseline. Given recent Eliquis dose, will follow aPTT and heparin levels and not bolus patient.  ? ?Goal of Therapy:  ?Heparin level 0.3-0.7 units/ml ?aPTT 66-102 seconds ?Monitor platelets by anticoagulation protocol: Yes ?  ?Plan:  ?Start IV heparin 1150 units/h @ 2100 tonight ?8h aPTT/heparin level from start ?Daily heparin level, aPTT, CBC ?Monitor for signs and symptoms of bleeding ? ?Thank you for involving pharmacy in this patient's care. ? ?Elita Quick, PharmD ?PGY1 Ambulatory Care Pharmacy Resident ?04/16/2022  11:18 AM ? ?**Pharmacist phone directory can be found on [email or web address] listed under [place]** ?

## 2022-04-16 NOTE — Progress Notes (Signed)
Mobility Specialist Progress Note  ? ? 04/16/22 1603  ?Mobility  ?Activity Transferred from bed to chair  ?Level of Assistance Contact guard assist, steadying assist  ?Assistive Device Front wheel walker  ?Distance Ambulated (ft) 2 ft  ?Activity Response Tolerated well  ?$Mobility charge 1 Mobility  ? ?Pre-Mobility: 140/94 BP ?During Mobility: 129/88 BP sitting EOB ?Post-Mobility: 136 HR, 123/89 BP ? ?Pt received in bed and agreeable. No complaints. Left with call bell in reach and chair alarm on.  ? ?Caroline Zamora ?Mobility Specialist  ?Primary: 5N M.S. Phone: [phone] ?Secondary: 6N M.S. Phone: [phone] ?  ?

## 2022-04-17 ENCOUNTER — Encounter (HOSPITAL_COMMUNITY)
Admission: AD | Disposition: A | Discharge status: Home / Self Care | Payer: Self-pay | Source: Other Acute Inpatient Hospital | Attending: Cardiovascular Disease

## 2022-04-17 DIAGNOSIS — I2510 Atherosclerotic heart disease of native coronary artery without angina pectoris: Secondary | ICD-10-CM

## 2022-04-17 HISTORY — PX: LEFT HEART CATH AND CORONARY ANGIOGRAPHY: CATH118249

## 2022-04-17 LAB — BASIC METABOLIC PANEL
Anion gap: 9 (ref 5–15)
BUN: 10 mg/dL (ref 8–23)
CO2: 30 mmol/L (ref 22–32)
Calcium: 8.6 mg/dL — ABNORMAL LOW (ref 8.9–10.3)
Chloride: 98 mmol/L (ref 98–111)
Creatinine, Ser: 0.95 mg/dL (ref 0.44–1.00)
GFR, Estimated: >60 mL/min (ref >60)
Glucose, Bld: 116 mg/dL — ABNORMAL HIGH (ref 70–99)
Potassium: 3 mmol/L — ABNORMAL LOW (ref 3.5–5.1)
Sodium: 137 mmol/L (ref 135–145)

## 2022-04-17 LAB — CBC
HCT: 37.8 % (ref 36.0–46.0)
Hemoglobin: 12.8 g/dL (ref 12.0–15.0)
MCH: 28.3 pg (ref 26.0–34.0)
MCHC: 33.9 g/dL (ref 30.0–36.0)
MCV: 83.6 fL (ref 80.0–100.0)
Platelets: 260 10*3/uL (ref 150–400)
RBC: 4.52 MIL/uL (ref 3.87–5.11)
RDW: 13.5 % (ref 11.5–15.5)
WBC: 6.5 10*3/uL (ref 4.0–10.5)
nRBC: 0 % (ref 0.0–0.2)

## 2022-04-17 LAB — HEPARIN LEVEL (UNFRACTIONATED): Heparin Unfractionated: >1.1 IU/mL — ABNORMAL HIGH (ref 0.30–0.70)

## 2022-04-17 LAB — APTT: aPTT: 37 s — ABNORMAL HIGH (ref 24–36)

## 2022-04-17 SURGERY — LEFT HEART CATH AND CORONARY ANGIOGRAPHY
Anesthesia: LOCAL

## 2022-04-17 MED ORDER — SODIUM CHLORIDE 0.9 % IV SOLN: 250.0000 mL | INTRAVENOUS | Status: DC | PRN

## 2022-04-17 MED ORDER — LIDOCAINE HCL (PF) 1 % IJ SOLN
INTRAMUSCULAR | Status: DC | PRN
  Administered 2022-04-17: 2 mL

## 2022-04-17 MED ORDER — NITROGLYCERIN 1 MG/10 ML FOR IR/CATH LAB
INTRA_ARTERIAL | Status: AC
  Filled 2022-04-17: qty 10

## 2022-04-17 MED ORDER — HEPARIN SODIUM (PORCINE) 1000 UNIT/ML IJ SOLN
INTRAMUSCULAR | Status: DC | PRN
  Administered 2022-04-17: 3000 [IU] via INTRAVENOUS

## 2022-04-17 MED ORDER — HEPARIN (PORCINE) IN NACL 1000-0.9 UT/500ML-% IV SOLN
INTRAVENOUS | Status: DC | PRN
  Administered 2022-04-17 (×2): 500 mL

## 2022-04-17 MED ORDER — HEPARIN (PORCINE) 25000 UT/250ML-% IV SOLN
1300.0000 [IU]/h | INTRAVENOUS | Status: DC
Start: 2022-04-17 — End: 2022-04-18
  Administered 2022-04-17: 1300 [IU]/h via INTRAVENOUS
  Filled 2022-04-17: qty 250

## 2022-04-17 MED ORDER — MORPHINE SULFATE (PF) 2 MG/ML IV SOLN: 2.0000 mg | INTRAVENOUS | Status: DC | PRN

## 2022-04-17 MED ORDER — CARVEDILOL 25 MG PO TABS
25.0000 mg | ORAL_TABLET | Freq: Two times a day (BID) | ORAL | Status: DC
  Administered 2022-04-17 – 2022-04-19 (×4): 25 mg via ORAL
  Filled 2022-04-17 (×4): qty 1

## 2022-04-17 MED ORDER — DILTIAZEM HCL ER COATED BEADS 240 MG PO CP24: 240.0000 mg | ORAL_CAPSULE | Freq: Every day | ORAL | Status: DC

## 2022-04-17 MED ORDER — HYDRALAZINE HCL 20 MG/ML IJ SOLN: 10.0000 mg | INTRAMUSCULAR | Status: AC | PRN

## 2022-04-17 MED ORDER — LIDOCAINE HCL (PF) 1 % IJ SOLN
INTRAMUSCULAR | Status: DC | PRN
  Administered 2022-04-17: 15 mL via INTRA_ARTERIAL

## 2022-04-17 MED ORDER — POTASSIUM CHLORIDE CRYS ER 20 MEQ PO TBCR
40.0000 meq | EXTENDED_RELEASE_TABLET | Freq: Once | ORAL | Status: AC
  Administered 2022-04-17: 40 meq via ORAL
  Filled 2022-04-17: qty 2

## 2022-04-17 MED ORDER — HEPARIN (PORCINE) IN NACL 1000-0.9 UT/500ML-% IV SOLN
INTRAVENOUS | Status: AC
  Filled 2022-04-17: qty 500

## 2022-04-17 MED ORDER — SODIUM CHLORIDE 0.9 % IV SOLN
INTRAVENOUS | Status: AC
  Administered 2022-04-17: 1000 mL via INTRAVENOUS

## 2022-04-17 MED ORDER — LIDOCAINE HCL (PF) 1 % IJ SOLN
INTRAMUSCULAR | Status: AC
  Filled 2022-04-17: qty 30

## 2022-04-17 MED ORDER — HEPARIN SODIUM (PORCINE) 1000 UNIT/ML IJ SOLN
INTRAMUSCULAR | Status: AC
  Filled 2022-04-17: qty 10

## 2022-04-17 MED ORDER — SODIUM CHLORIDE 0.9% FLUSH
3.0000 mL | Freq: Two times a day (BID) | INTRAVENOUS | Status: DC
  Administered 2022-04-17 – 2022-04-18 (×2): 3 mL via INTRAVENOUS

## 2022-04-17 MED ORDER — ONDANSETRON HCL 4 MG/2ML IJ SOLN: 4.0000 mg | Freq: Four times a day (QID) | INTRAMUSCULAR | Status: DC | PRN

## 2022-04-17 MED ORDER — VERAPAMIL HCL 2.5 MG/ML IV SOLN
INTRAVENOUS | Status: AC
  Filled 2022-04-17: qty 2

## 2022-04-17 MED ORDER — SODIUM CHLORIDE 0.9% FLUSH: 3.0000 mL | INTRAVENOUS | Status: DC | PRN

## 2022-04-17 MED ORDER — LABETALOL HCL 5 MG/ML IV SOLN: 10.0000 mg | INTRAVENOUS | Status: AC | PRN

## 2022-04-17 MED ORDER — ACETAMINOPHEN 325 MG PO TABS: 650.0000 mg | ORAL_TABLET | ORAL | Status: DC | PRN

## 2022-04-17 MED ORDER — IOHEXOL 350 MG/ML SOLN
INTRAVENOUS | Status: DC | PRN
  Administered 2022-04-17: 70 mL

## 2022-04-17 SURGICAL SUPPLY — 13 items
CATH INFINITI JR4 5F (CATHETERS) ×1 IMPLANT
CATH OPTITORQUE TIG 4.0 5F (CATHETERS) ×1 IMPLANT
DEVICE RAD COMP TR BAND LRG (VASCULAR PRODUCTS) ×1 IMPLANT
GLIDESHEATH SLEND A-KIT 6F 22G (SHEATH) ×1 IMPLANT
GUIDEWIRE INQWIRE 1.5J.035X260 (WIRE) IMPLANT
INQWIRE 1.5J .035X260CM (WIRE) ×2
KIT HEART LEFT (KITS) ×2 IMPLANT
PACK CARDIAC CATHETERIZATION (CUSTOM PROCEDURE TRAY) ×2 IMPLANT
SHEATH PROBE COVER 6X72 (BAG) ×1 IMPLANT
SYR MEDRAD MARK 7 150ML (SYRINGE) ×2 IMPLANT
TRANSDUCER W/STOPCOCK (MISCELLANEOUS) ×2 IMPLANT
TUBING CIL FLEX 10 FLL-RA (TUBING) ×2 IMPLANT
WIRE HI TORQ VERSACORE-J 145CM (WIRE) ×1 IMPLANT

## 2022-04-17 NOTE — Plan of Care (Signed)
°  Problem: Education: °Goal: Knowledge of disease or condition will improve °Outcome: Progressing °Goal: Understanding of medication regimen will improve °Outcome: Progressing °  °

## 2022-04-17 NOTE — Progress Notes (Signed)
ANTICOAGULATION CONSULT NOTE ? ?Pharmacy Consult for IV heparin ?Indication: atrial fibrillation/ACS ? ?Allergies  ?Allergen Reactions  ? Desyrel [Trazodone] Rash  ? Sulfa Antibiotics Rash  ? ? ?Patient Measurements: ?Weight: 76.4 kg (168 lb 6.4 oz) ?Heparin Dosing Weight: 76.5 kg ? ?Vital Signs: ?Temp: 97.7 ?F (36.5 ?C) (05/15 0960) ?Temp Source: Oral (05/15 4540) ?BP: 137/90 (05/15 0826) ?Pulse Rate: 77 (05/15 0826) ? ?Labs: ?Recent Labs  ?  04/15/22 ?0503 04/16/22 ?0255 04/17/22 ?9811  ?HGB 12.7 12.9 12.8  ?HCT 39.8 39.6 37.8  ?PLT 270 238 260  ?APTT  --   --  37*  ?HEPARINUNFRC  --   --  >1.10*  ?CREATININE 0.86 0.95 0.95  ? ? ? ?Estimated Creatinine Clearance: 50.9 mL/min (by C-G formula based on SCr of 0.95 mg/dL). ? ? ?Medical History: ?Past Medical History:  ?Diagnosis Date  ? 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 CKD (chronic kidney disease), stage II   ? Colon cancer ([place])   ? Depression   ? Dysphagia   ? Headache   ? migraine  ? Hx of adenomatous colonic polyps   ? Hyperlipidemia   ? Hypertension   ? PAF (paroxysmal atrial fibrillation) ([place])   ? Paroxysmal atrial fibrillation (HCC)   ? Skin cancer   ? Syncope   ? ?Assessment: ?65 YOF presenting with recurrent syncopal episodes. Patient to go for radial cath tomorrow afternoon. Pharmacy asked to dose heparin. Patient on Eliquis for Afib PTA. Last dose 5/14 @ 0900. ? ?Hgb and platelets are within normal limits. Renal function appears to be at baseline. Given recent Eliquis dose, will follow aPTT and heparin levels and not bolus patient.  ? 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  no issues with infusion or s/sx of bleeding per RN; CBC stable, ?Goal of Therapy:  ?Heparin level 0.3-0.7 units/ml ?aPTT 66-102 seconds ?Monitor platelets by anticoagulation protocol: Yes ?  ?Plan:  ?Resume IV heparin to  1300 units/h  ?Daily heparin level, aPTT, CBC ?Monitor for signs and symptoms of bleeding ? ? ? ?Bonnita Nasuti Pharm.D. CPP, BCPS ?Clinical Pharmacist ?[phone] ?04/17/2022 2:13 PM  ? ?Please check AMION for all [place] numbers ? ?

## 2022-04-17 NOTE — Progress Notes (Signed)
ANTICOAGULATION CONSULT NOTE ? ?Pharmacy Consult for IV heparin ?Indication: atrial fibrillation ? ?Allergies  ?Allergen Reactions  ? Desyrel [Trazodone] Rash  ? Sulfa Antibiotics Rash  ? ? ?Patient Measurements: ?  ?Heparin Dosing Weight: 76.5 kg ? ?Vital Signs: ?Temp: 98.3 ?F (36.8 ?C) (05/15 0014) ?Temp Source: Oral (05/15 0014) ?BP: 120/83 (05/15 0014) ?Pulse Rate: 135 (05/14 1725) ? ?Labs: ?Recent Labs  ?  04/15/22 ?0503 04/16/22 ?0255 04/17/22 ?4765  ?HGB 12.7 12.9 12.8  ?HCT 39.8 39.6 37.8  ?PLT 270 238 260  ?APTT  --   --  37*  ?HEPARINUNFRC  --   --  >1.10*  ?CREATININE 0.86 0.95 0.95  ? ? ? ?Estimated Creatinine Clearance: 52.7 mL/min (by C-G formula based on SCr of 0.95 mg/dL). ? ? ?Medical History: ?Past Medical History:  ?Diagnosis Date  ? 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 CKD (chronic kidney disease), stage II   ? Colon cancer ([place])   ? Depression   ? Dysphagia   ? Headache   ? migraine  ? Hx of adenomatous colonic polyps   ? Hyperlipidemia   ? Hypertension   ? PAF (paroxysmal atrial fibrillation) ([place])   ? Paroxysmal atrial fibrillation (HCC)   ? Skin cancer   ? Syncope   ? ?Assessment: ?26 YOF presenting with recurrent syncopal episodes. Patient to go for radial cath tomorrow afternoon. Pharmacy asked to dose heparin. Patient on Eliquis for Afib PTA. Last dose 5/14 @ 0900. ? ?Hgb and platelets are within normal limits. Renal function appears to be at baseline. Given recent Eliquis dose, will follow aPTT and heparin levels and not bolus patient.  ? ?Initial aPTT below goal at 37 - no issues with infusion or s/sx of bleeding per RN; CBC stable, will increase ~4 units/kg/hr ? ?Goal of Therapy:  ?Heparin level 0.3-0.7 units/ml ?aPTT 66-102 seconds ?Monitor platelets by anticoagulation protocol: Yes ?  ?Plan:  ?Increase IV heparin to 1450 units/h  ?8h aPTT/heparin level from start ?Daily heparin level, aPTT, CBC ?Monitor for signs and symptoms of bleeding ? ?Georga Bora, PharmD ?Clinical  Pharmacist ?04/17/2022 5:16 AM ?Please check AMION for all [place] numbers ? ?

## 2022-04-17 NOTE — Interval H&P Note (Signed)
Cath Lab Visit (complete for each Cath Lab visit) ? ?Clinical Evaluation Leading to the Procedure:  ? ?ACS: Yes.   ? ?Non-ACS:   ? ?Anginal Classification: No Symptoms ? ?Anti-ischemic medical therapy: Maximal Therapy (2 or more classes of medications) ? ?Non-Invasive Test Results: No non-invasive testing performed ? ?Prior CABG: No previous CABG ? ? ? ? ? ?History and Physical Interval Note: ? ?04/17/2022 ?12:31 PM ? ?Caroline Zamora  has presented today for surgery, with the diagnosis of unstable angina.  The various methods of treatment have been discussed with the patient and family. After consideration of risks, benefits and other options for treatment, the patient has consented to  Procedure(s): ?LEFT HEART CATH AND CORONARY ANGIOGRAPHY (N/A) as a surgical intervention.  The patient's history has been reviewed, patient examined, no change in status, stable for surgery.  I have reviewed the patient's chart and labs.  Questions were answered to the patient's satisfaction.   ? ? ?Caroline Zamora ? ? ?

## 2022-04-17 NOTE — Progress Notes (Signed)
Paged on call cardiology provider, Rudi Rummage, MD regarding aflutter HR sustaining in the 130's & possible order for an additional medication to help decrease HR. Per call back received from [place], no new orders at this time. Will continue to monitor ?

## 2022-04-17 NOTE — Progress Notes (Signed)
? ?Electrophysiology Rounding Note ? ?Patient Name: AQSA SENSABAUGH ?Date of Encounter: 04/17/2022 ? ?Primary Cardiologist: None ?Electrophysiologist: Dr. Quentin Ore ? ? ?Subjective  ? ?The patient is doing well today.  At this time, the patient denies chest pain, shortness of breath, or any new concerns. ? ?Inpatient Medications  ?  ?Scheduled Meds: ? calcium-vitamin D  1 tablet Oral TID  ? carvedilol  12.5 mg Oral BID WC  ? estradiol  0.5 mg Oral QHS  ? losartan  25 mg Oral Daily  ? sodium chloride flush  3 mL Intravenous Q12H  ? venlafaxine XR  150 mg Oral Q breakfast  ? ?Continuous Infusions: ? sodium chloride    ? sodium chloride    ? amiodarone 30 mg/hr (04/16/22 1726)  ? heparin 1,450 Units/hr (04/17/22 0534)  ? ?PRN Meds: ?sodium chloride, acetaminophen, fluticasone, hydrOXYzine, LORazepam, melatonin, ondansetron (ZOFRAN) IV, sodium chloride flush  ? ?Vital Signs  ?  ?Vitals:  ? 04/16/22 2048 04/17/22 0014 04/17/22 0540 04/17/22 0615  ?BP: 118/84 120/83 (!) 130/101   ?Pulse:      ?Resp: '19 20 19   '$ ?Temp: 98.4 ?F (36.9 ?C) 98.3 ?F (36.8 ?C) 97.9 ?F (36.6 ?C)   ?TempSrc: Oral Oral Axillary   ?SpO2: 98% 97% 96%   ?Weight:    76.4 kg  ? ? ?Intake/Output Summary (Last 24 hours) at 04/17/2022 0801 ?Last data filed at 04/17/2022 1914 ?Gross per 24 hour  ?Intake 240 ml  ?Output 375 ml  ?Net -135 ml  ? ?Filed Weights  ? 04/17/22 0615  ?Weight: 76.4 kg  ? ? ?Physical Exam  ?  ?GEN- The patient is well appearing, alert and oriented x 3 today.   ?Head- normocephalic, atraumatic ?Eyes-  Sclera clear, conjunctiva pink ?Ears- hearing intact ?Oropharynx- clear ?Neck- supple ?Lungs- Clear to ausculation bilaterally, normal work of breathing ?Heart- Regular rate and rhythm, no murmurs, rubs or gallops ?GI- soft, NT, ND, + BS ?Extremities- no clubbing or cyanosis. No edema ?Skin- no rash or lesion ?Psych- euthymic mood, full affect ?Neuro- strength and sensation are intact ? ?Labs  ?  ?CBC ?Recent Labs  ?  04/16/22 ?0255  04/17/22 ?0333  ?WBC 6.6 6.5  ?HGB 12.9 12.8  ?HCT 39.6 37.8  ?MCV 85.7 83.6  ?PLT 238 260  ? ?Basic Metabolic Panel ?Recent Labs  ?  04/16/22 ?0255 04/17/22 ?0333  ?NA 135 137  ?K 3.5 3.0*  ?CL 99 98  ?CO2 28 30  ?GLUCOSE 97 116*  ?BUN 7* 10  ?CREATININE 0.95 0.95  ?CALCIUM 8.3* 8.6*  ? ?Liver Function Tests ?No results for input(s): AST, ALT, ALKPHOS, BILITOT, PROT, ALBUMIN in the last 72 hours. ?No results for input(s): LIPASE, AMYLASE in the last 72 hours. ?Cardiac Enzymes ?No results for input(s): CKTOTAL, CKMB, CKMBINDEX, TROPONINI in the last 72 hours. ? ? ?Telemetry  ?  ?Convert to NSR just after 0600 this am. No pauses.  (personally reviewed) ? ?Radiology  ?  ?No results found. ? ?Patient Profile  ?   ?Ms. Tamm is a pleasant 81 year old woman with recurrent syncopal episodes with personal injury.  She has at least moderate left ventricular dysfunction with global hypokinesis.  I am concerned that her syncope may be arrhythmic.  We need to better assess her left ventricular function and for evidence of structural heart disease.  Treatment plan will depend on left ventricular function on cardiac MRI. ? ?Assessment & Plan  ?  ?1.Syncope ?Concerning for arrhythmic origin. ?Cardiac MRI pending ?LHC  today.  ?Continue telemetry monitoring ? ?2. Paroxysmal atrial fibrillation and flutter ?Continue Eliquis ?Continue Coreg ?Continue amiodarone, transition to oral after IV load completed ?  ?3. Frequent PVCs ?Cardiac MRI as above. ?  ?4. Chronic systolic HF ?NYHA II-III ?Warm and euvolemic. ?Continue ARB ?Cont BB ?MRI and LHC planned ? ?Disposition pending work up.  ? ? ?For questions or updates, please contact [place] ?Please consult [email or web address] for contact info under Cardiology/STEMI. ? ?Signed, ?Shirley Friar, PA-C  ?04/17/2022, 8:01 AM  ? ?

## 2022-04-18 ENCOUNTER — Encounter (HOSPITAL_COMMUNITY): Payer: Self-pay | Admitting: Cardiovascular Disease

## 2022-04-18 ENCOUNTER — Other Ambulatory Visit (HOSPITAL_COMMUNITY): Payer: Self-pay

## 2022-04-18 ENCOUNTER — Inpatient Hospital Stay (HOSPITAL_COMMUNITY): Payer: Medicare HMO

## 2022-04-18 DIAGNOSIS — I493 Ventricular premature depolarization: Secondary | ICD-10-CM

## 2022-04-18 LAB — CBC
HCT: 36.6 % (ref 36.0–46.0)
Hemoglobin: 12.4 g/dL (ref 12.0–15.0)
MCH: 28.9 pg (ref 26.0–34.0)
MCHC: 33.9 g/dL (ref 30.0–36.0)
MCV: 85.3 fL (ref 80.0–100.0)
Platelets: 238 10*3/uL (ref 150–400)
RBC: 4.29 MIL/uL (ref 3.87–5.11)
RDW: 13.6 % (ref 11.5–15.5)
WBC: 7.4 10*3/uL (ref 4.0–10.5)
nRBC: 0 % (ref 0.0–0.2)

## 2022-04-18 LAB — HEPARIN LEVEL (UNFRACTIONATED)
Heparin Unfractionated: >1.1 IU/mL — ABNORMAL HIGH (ref 0.30–0.70)
Heparin Unfractionated: >1.1 IU/mL — ABNORMAL HIGH (ref 0.30–0.70)

## 2022-04-18 LAB — APTT: aPTT: >200 seconds (ref 24–36)

## 2022-04-18 LAB — BASIC METABOLIC PANEL
Anion gap: 8 (ref 5–15)
BUN: 17 mg/dL (ref 8–23)
CO2: 26 mmol/L (ref 22–32)
Calcium: 8 mg/dL — ABNORMAL LOW (ref 8.9–10.3)
Chloride: 100 mmol/L (ref 98–111)
Creatinine, Ser: 0.97 mg/dL (ref 0.44–1.00)
GFR, Estimated: 60 mL/min — ABNORMAL LOW (ref >60)
Glucose, Bld: 131 mg/dL — ABNORMAL HIGH (ref 70–99)
Potassium: 3.4 mmol/L — ABNORMAL LOW (ref 3.5–5.1)
Sodium: 134 mmol/L — ABNORMAL LOW (ref 135–145)

## 2022-04-18 LAB — MAGNESIUM: Magnesium: 1.7 mg/dL (ref 1.7–2.4)

## 2022-04-18 MED ORDER — HEPARIN (PORCINE) 25000 UT/250ML-% IV SOLN
1000.0000 [IU]/h | INTRAVENOUS | Status: DC
  Administered 2022-04-18: 1000 [IU]/h via INTRAVENOUS
  Filled 2022-04-18: qty 250

## 2022-04-18 MED ORDER — MAGNESIUM SULFATE 2 GM/50ML IV SOLN
2.0000 g | Freq: Once | INTRAVENOUS | Status: AC
  Administered 2022-04-18: 2 g via INTRAVENOUS
  Filled 2022-04-18: qty 50

## 2022-04-18 MED ORDER — GADOBUTROL 1 MMOL/ML IV SOLN
7.0000 mL | Freq: Once | INTRAVENOUS | Status: AC | PRN
  Administered 2022-04-18: 7 mL via INTRAVENOUS

## 2022-04-18 MED ORDER — AMIODARONE HCL 200 MG PO TABS
400.0000 mg | ORAL_TABLET | Freq: Two times a day (BID) | ORAL | Status: DC
  Administered 2022-04-18 – 2022-04-19 (×3): 400 mg via ORAL
  Filled 2022-04-18 (×3): qty 2

## 2022-04-18 MED ORDER — POTASSIUM CHLORIDE CRYS ER 20 MEQ PO TBCR
40.0000 meq | EXTENDED_RELEASE_TABLET | Freq: Once | ORAL | Status: AC
  Administered 2022-04-18: 40 meq via ORAL
  Filled 2022-04-18: qty 2

## 2022-04-18 MED ORDER — APIXABAN 5 MG PO TABS
5.0000 mg | ORAL_TABLET | Freq: Two times a day (BID) | ORAL | Status: DC
  Administered 2022-04-18 – 2022-04-19 (×2): 5 mg via ORAL
  Filled 2022-04-18 (×2): qty 1

## 2022-04-18 NOTE — Progress Notes (Signed)
Preliminary cMRI results showed no scar and EF 42%. Not currently ICD candidate. ? ?Per Dr Quentin Ore will transition to oral medications and plan home in am with 30 day monitor and outpatient follow up with Dr Caryl Comes. ? ?Caroline Zamora ?Caroline Zamora? Caroline Klarich, PA-C

## 2022-04-18 NOTE — Progress Notes (Signed)
ANTICOAGULATION CONSULT NOTE ? ?Pharmacy Consult for IV heparin ?Indication: atrial fibrillation ? ?Allergies  ?Allergen Reactions  ? Desyrel [Trazodone] Rash  ? Sulfa Antibiotics Rash  ? ? ?Patient Measurements: ?Weight: 76.4 kg (168 lb 6.4 oz) ?Heparin Dosing Weight: 76.5 kg ? ?Vital Signs: ?Temp: 98.1 ?F (36.7 ?C) (05/16 0020) ?Temp Source: Oral (05/16 0020) ?BP: 117/69 (05/16 0020) ?Pulse Rate: 83 (05/15 2024) ? ?Labs: ?Recent Labs  ?  04/16/22 ?0255 04/17/22 ?0333 04/18/22 ?0420 04/18/22 ?4008  ?HGB 12.9 12.8 12.4  --   ?HCT 39.6 37.8 36.6  --   ?PLT 238 260 238  --   ?APTT  --  37* 199* >200*  ?HEPARINUNFRC  --  >1.10* >1.10* >1.10*  ?CREATININE 0.95 0.95 0.97  --   ? ? ? ?Estimated Creatinine Clearance: 49.9 mL/min (by C-G formula based on SCr of 0.97 mg/dL). ? ? ?Medical History: ?Past Medical History:  ?Diagnosis Date  ? 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 CKD (chronic kidney disease), stage II   ? Colon cancer ([place])   ? Depression   ? Dysphagia   ? Headache   ? migraine  ? Hx of adenomatous colonic polyps   ? Hyperlipidemia   ? Hypertension   ? PAF (paroxysmal atrial fibrillation) ([place])   ? Paroxysmal atrial fibrillation (H[facility])   ? Skin cancer   ? Syncope   ? ?Assessment: ?71 YOF presenting with recurrent syncopal episodes. Patient to go for radial cath tomorrow afternoon. Pharmacy asked to dose heparin. Patient on Eliquis for Afib PTA. Last dose 5/14 @ 0900. ? ?Hgb and platelets are within normal limits. Renal function appears to be at baseline. Given recent Eliquis dose, will follow aPTT and heparin levels and not bolus patient.  ? ?Heparin drip resumed at 1300 uts/hr after cath 5/15 heparin level >1.1 as expected this am with apixaban interference but aptt > goal also at > 200 drawn correctly from opposite arm as heparin infusion ? ?will restart apixaban when procedures completed.  no issues with infusion or s/sx of bleeding ?CBC stable ? ?Goal of Therapy:  ?Heparin level 0.3-0.7 units/ml ?aPTT 66-102  seconds ?Monitor platelets by anticoagulation protocol: Yes ?  ?Plan:  ?Hold heparin x1 hr  ?Resume heparin drip 1000 uts/hr  ?Check aptt 6hr after restart  ?Daily heparin level, aPTT, CBC ?Monitor for signs and symptoms of bleeding ? ? ? ?Bonnita Nasuti Pharm.D. CPP, BCPS ?Clinical Pharmacist ?[phone] ?04/18/2022 7:50 AM  ? ?Please check AMION for all [place] numbers ? ?

## 2022-04-18 NOTE — Progress Notes (Signed)
PT Cancellation Note ? ?Patient Details ?Name: Caroline Zamora ?MRN: [id number] ?DOB: 1941/10/13 ? ? ?Cancelled Treatment:    Reason Eval/Treat Not Completed: Patient at procedure or test/unavailable ? ? ?Edwen Mclester B Takeyla Million ?04/18/2022, 10:04 AM ?Lyndon Chenoweth P, PT ?Acute Rehabilitation Services ?Pager: [phone] ?Office: [phone] ? ?

## 2022-04-18 NOTE — Telephone Encounter (Signed)
Reviewed the patient's chart.  ?She is still currently admitted to [facility]. ?  ?Cath done- 04/17/22. ?Scheduled for an inpatient Cardiac MRI today.  ?  ?Phone note routed to Dr. Caryl Comes as an Jul[facility] Rainier.  ?

## 2022-04-18 NOTE — Progress Notes (Addendum)
Critical Lab Value ?Note date & time 04/18/22 @ 5:34 ? ?Date and time results received: 04/18/22 @ 5:10 ?Test: APTT ?Critical Value: 199 ? ?Urgent order placed to redraw APTT & heparin labs ?Georga Bora, [facility] notified-per our conversation OK to continue IV Heparin for now until redraw resulted  ? ?UPDATE: ?Critical Lab Value ?Note date & time 04/18/22 07:15 ? ?Date & time results received 04/18/22 @ 07:14 ?Test: APTT ?Critical Value: >200 ?Wallene Dales, RN updated  ?

## 2022-04-18 NOTE — Progress Notes (Signed)
?  Mobility Specialist Criteria Algorithm Info. ? ? 04/18/22 1615  ?Mobility  ?Activity Ambulated with assistance in hallway;Dangled on edge of bed;Stood at bedside  ?Range of Motion/Exercises Active;All extremities  ?Level of Assistance Contact guard assist, steadying assist  ?Assistive Device Front wheel walker  ?Distance Ambulated (ft) 280 ft  ?Activity Response Tolerated well  ? ?Patient received in supine agreeable to participate in mobility. C/o chronic dizziness while supine with head movement and positional changes. Was independent for bed mobility. Dizziness subsided quickly upon sitting up. Ambulated in hallway min guard with slow steady gait. Returned to room without complaint or incident. Was left in supine with all needs met, call bell in reach.  ? ?04/18/2022 ?5:23 PM ? ?[place] , CMS, BS EXP ?Acute Rehabilitation Services  ?CBJSE:[phone] ?Office: [phone] ? ?

## 2022-04-18 NOTE — Progress Notes (Signed)
? ?Electrophysiology Rounding Note ? ?Patient Name: Caroline Zamora ?Date of Encounter: 04/18/2022 ? ?Primary Cardiologist: None ?Electrophysiologist: None ? ? ?Subjective  ? ?The patient is doing well today.  At this time, the patient denies chest pain, shortness of breath, or any new concerns. ? ?Inpatient Medications  ?  ?Scheduled Meds: ? calcium-vitamin D  1 tablet Oral TID  ? carvedilol  25 mg Oral BID  ? estradiol  0.5 mg Oral QHS  ? losartan  25 mg Oral Daily  ? potassium chloride  40 mEq Oral Once  ? sodium chloride flush  3 mL Intravenous Q12H  ? sodium chloride flush  3 mL Intravenous Q12H  ? venlafaxine XR  150 mg Oral Q breakfast  ? ?Continuous Infusions: ? sodium chloride    ? amiodarone 30 mg/hr (04/18/22 0516)  ? heparin    ? ?PRN Meds: ?sodium chloride, acetaminophen, fluticasone, hydrOXYzine, LORazepam, melatonin, morphine injection, ondansetron (ZOFRAN) IV, sodium chloride flush  ? ?Vital Signs  ?  ?Vitals:  ? 04/17/22 0826 04/17/22 1235 04/17/22 2024 04/18/22 0020  ?BP: 137/90  108/82 117/69  ?Pulse: 77  83   ?Resp: 19     ?Temp: 97.7 ?F (36.5 ?C)  98.1 ?F (36.7 ?C) 98.1 ?F (36.7 ?C)  ?TempSrc: Oral  Oral Oral  ?SpO2:  98% 100% 95%  ?Weight:      ? ? ?Intake/Output Summary (Last 24 hours) at 04/18/2022 0804 ?Last data filed at 04/18/2022 0516 ?Gross per 24 hour  ?Intake 327.61 ml  ?Output 375 ml  ?Net -47.39 ml  ? ?Filed Weights  ? 04/17/22 0615  ?Weight: 76.4 kg  ? ? ?Physical Exam  ?  ?GEN- The patient is well appearing, alert and oriented x 3 today.   ?Head- normocephalic, atraumatic ?Eyes-  Sclera clear, conjunctiva pink ?Ears- hearing intact ?Oropharynx- clear ?Neck- supple ?Lungs- Clear to ausculation bilaterally, normal work of breathing ?Heart- Irregularly irregular rate and rhythm, no murmurs, rubs or gallops ?GI- soft, NT, ND, + BS ?Extremities- no clubbing or cyanosis. No edema ?Skin- no rash or lesion ?Psych- euthymic mood, full affect ?Neuro- strength and sensation are intact ? ?Labs   ?  ?CBC ?Recent Labs  ?  04/17/22 ?0333 04/18/22 ?0420  ?WBC 6.5 7.4  ?HGB 12.8 12.4  ?HCT 37.8 36.6  ?MCV 83.6 85.3  ?PLT 260 238  ? ?Basic Metabolic Panel ?Recent Labs  ?  04/17/22 ?0333 04/18/22 ?0420  ?NA 137 134*  ?K 3.0* 3.4*  ?CL 98 100  ?CO2 30 26  ?GLUCOSE 116* 131*  ?BUN 10 17  ?CREATININE 0.95 0.97  ?CALCIUM 8.6* 8.0*  ? ?Liver Function Tests ?No results for input(s): AST, ALT, ALKPHOS, BILITOT, PROT, ALBUMIN in the last 72 hours. ?No results for input(s): LIPASE, AMYLASE in the last 72 hours. ?Cardiac Enzymes ?No results for input(s): CKTOTAL, CKMB, CKMBINDEX, TROPONINI in the last 72 hours. ? ? ?Telemetry  ?  ?Continues to go in and out of AF with variable rates 90-120s (personally reviewed) ? ?Radiology  ?  ?CARDIAC CATHETERIZATION ? ?Result Date: 04/17/2022 ?Images from the original result were not included.   Prox RCA lesion is 30% stenosed. Caroline Zamora is a 81 y.o. female  [id number] LOCATION:  FACILITY: [facility] PHYSICIAN: [name], M.D. 11-Jun-1941 DATE OF PROCEDURE:  04/17/2022 DATE OF DISCHARGE: CARDIAC CATHETERIZATION History obtained from chart review.Caroline Zamora is a pleasant 81 year old woman with recurrent syncopal episodes with personal injury.  She has at least moderate left ventricular dysfunction with global  hypokinesis.  Her troponin mildly elevated.  She does have a history of PAF currently in sinus rhythm on Eliquis which was held.  She has moderate LV dysfunction.  She presents now for left heart cath to define her anatomy.  ? 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  She has a low LVEDP of 4.  She was in sinus rhythm at the beginning of the case and went into a drip/flutter with RVR at the end of the case suggesting that her syncope may be arrhythmogenic in nature.  I have communicated these results to Dr. Lars Mage, electrophysiology, who arranged the heart cath.  The sheath was removed and a TR band was placed on the right  wrist to achieve patent hemostasis.  The patient left lab in stable condition.  Heparin will be started 4 hours after sheath removal. [name]. MD, [facility] 04/17/2022 1:13 PM    ? ?Patient Profile  ?   ?Caroline Zamora is a pleasant 81 year old woman with recurrent syncopal episodes with personal injury.  She has at least moderate left ventricular dysfunction with global hypokinesis.  I am concerned that her syncope may be arrhythmic.  We need to better assess her left ventricular function and for evidence of structural heart disease.  Treatment plan will depend on left ventricular function on cardiac MRI. ? ?Assessment & Plan  ?  ?1.Syncope ?Concerning for arrhythmic origin. ?Cardiac MRI pending ?[place] 5/15 with non-obstructive CAD ?Continue telemetry monitoring ?If cMRI normal, likely consider d/c with monitoring.  ?If scar, will need to potentially consider EP study / ICD ? ?2. Paroxysmal atrial fibrillation and flutter ?Continue Eliquis ?Continue Coreg ?Continue amiodarone, transition to oral after IV load completed ?Continues to go in and out of AF with intermittent RVR ?  ?3. Frequent PVCs ?Cardiac MRI as above. ?  ?4. Chronic systolic HF ?[place] 5/15 with non-obstructive CAD ?NYHA II-III ?Warm and euvolemic. ?Continue ARB ?Cont BB ?cMRI pending ? ?For questions or updates, please contact [place] ?Please consult [email or web address] for contact info under Cardiology/STEMI. ? ?Signed, ?Shirley Friar, PA-C  ?04/18/2022, 8:04 AM  ? ?

## 2022-04-18 NOTE — TOC Benefit Eligibility Note (Signed)
Patient Advocate Encounter ? ?Insurance verification completed.   ? ?The patient is currently admitted and upon discharge could be taking Entresto 24-26 mg. ? ?The current 30 day co-pay is, $30.00.  ? ?The patient is currently admitted and upon discharge could be taking Farxiga 10 mg. ? ?The current 30 day co-pay is, $30.00.  ? ?The patient is currently admitted and upon discharge could be taking Jardiance 10 mg. ? ?The current 30 day co-pay is, $30.00.  ? ?The patient is insured through [employer] Part D  ? ? ? ?Lyndel Safe, CPhT ?Pharmacy Patient Advocate Specialist ?[place] Patient Advocate Team ?Direct Number: [phone]  Fax: [phone] ? ? ? ? ? ?  ?

## 2022-04-18 NOTE — Progress Notes (Signed)
ANTICOAGULATION CONSULT NOTE ? ?Pharmacy Consult for IV heparin>eliquis  ?Indication: atrial fibrillation ? ?Allergies  ?Allergen Reactions  ? Desyrel [Trazodone] Rash  ? Sulfa Antibiotics Rash  ? ? ?Patient Measurements: ?Weight: 76.4 kg (168 lb 6.4 oz) ?Heparin Dosing Weight: 76.5 kg ? ?Vital Signs: ?Temp: 97.8 ?F (36.6 ?C) (05/16 0800) ?Temp Source: Oral (05/16 0800) ?BP: 113/73 (05/16 0800) ?Pulse Rate: 71 (05/16 1100) ? ?Labs: ?Recent Labs  ?  04/16/22 ?0255 04/17/22 ?0333 04/18/22 ?0420 04/18/22 ?4536  ?HGB 12.9 12.8 12.4  --   ?HCT 39.6 37.8 36.6  --   ?PLT 238 260 238  --   ?APTT  --  37* 199* >200*  ?HEPARINUNFRC  --  >1.10* >1.10* >1.10*  ?CREATININE 0.95 0.95 0.97  --   ? ? ? ?Estimated Creatinine Clearance: 49.9 mL/min (by C-G formula based on SCr of 0.97 mg/dL). ? ? ?Medical History: ?Past Medical History:  ?Diagnosis Date  ? 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 CKD (chronic kidney disease), stage II   ? Colon cancer ([place])   ? Depression   ? Dysphagia   ? Headache   ? migraine  ? Hx of adenomatous colonic polyps   ? Hyperlipidemia   ? Hypertension   ? PAF (paroxysmal atrial fibrillation) ([place])   ? Paroxysmal atrial fibrillation (H[facility])   ? Skin cancer   ? Syncope   ? ?Assessment: ?7 YOF presenting with recurrent syncopal episodes. Patient to go for radial cath tomorrow afternoon. Pharmacy asked to dose heparin. Patient on Eliquis for Afib PTA. Last dose 5/14 @ 0900. ? ?Patient transitioned back to apixaban at 5 mg PO BID for intermittent runs of atrial fibrillation and atrial flutter. ? ?Goal of Therapy:  ?Heparin level 0.3-0.7 units/ml ?aPTT 66-102 seconds ?Monitor platelets by anticoagulation protocol: Yes ?  ?Plan:  ?STOP Heparin  ?Restart Apixaban 5 mg PO BID  ?Monitor for signs and symptoms of bleeding ? ?Adria Dill, PharmD ?PGY-1 Acute Care Resident  ?04/18/2022 3:15 PM  ? ? ?

## 2022-04-18 NOTE — Progress Notes (Signed)
PT Cancellation Note ? ?Patient Details ?Name: Caroline Zamora ?MRN: [id number] ?DOB: [birth year] ? ? ?Cancelled Treatment:    Reason Eval/Treat Not Completed: Patient not medically ready. Pt with APTT >200, high fall risk and will hold on OOB mobility at this time until labs more conducive to mobility ? ? ?Franca Stakes B Kameshia Madruga ?04/18/2022, 7:22 AM ?Tassie Pollett P, PT ?Acute Rehabilitation Services ?Pager: [phone] ?Office: [phone] ? ?

## 2022-04-19 ENCOUNTER — Inpatient Hospital Stay (HOSPITAL_COMMUNITY)
Admission: AD | Admit: 2022-04-19 | Discharge: 2022-04-19 | Disposition: A | Discharge status: Home / Self Care | Payer: Medicare HMO | Source: Other Acute Inpatient Hospital | Attending: Student | Admitting: Student

## 2022-04-19 DIAGNOSIS — R55 Syncope and collapse: Secondary | ICD-10-CM

## 2022-04-19 LAB — BASIC METABOLIC PANEL
Anion gap: 7 (ref 5–15)
BUN: 17 mg/dL (ref 8–23)
CO2: 28 mmol/L (ref 22–32)
Calcium: 8.7 mg/dL — ABNORMAL LOW (ref 8.9–10.3)
Chloride: 101 mmol/L (ref 98–111)
Creatinine, Ser: 0.97 mg/dL (ref 0.44–1.00)
GFR, Estimated: 60 mL/min — ABNORMAL LOW (ref >60)
Glucose, Bld: 93 mg/dL (ref 70–99)
Potassium: 3.7 mmol/L (ref 3.5–5.1)
Sodium: 136 mmol/L (ref 135–145)

## 2022-04-19 LAB — CBC
HCT: 35.2 % — ABNORMAL LOW (ref 36.0–46.0)
Hemoglobin: 11.6 g/dL — ABNORMAL LOW (ref 12.0–15.0)
MCH: 28.2 pg (ref 26.0–34.0)
MCHC: 33 g/dL (ref 30.0–36.0)
MCV: 85.4 fL (ref 80.0–100.0)
Platelets: 254 10*3/uL (ref 150–400)
RBC: 4.12 MIL/uL (ref 3.87–5.11)
RDW: 13.8 % (ref 11.5–15.5)
WBC: 5.8 10*3/uL (ref 4.0–10.5)
nRBC: 0 % (ref 0.0–0.2)

## 2022-04-19 LAB — APTT: aPTT: 199 seconds (ref 24–36)

## 2022-04-19 LAB — LIPOPROTEIN A (LPA): Lipoprotein (a): 94.5 nmol/L — ABNORMAL HIGH (ref <75.0)

## 2022-04-19 MED ORDER — AMIODARONE HCL 200 MG PO TABS: ORAL_TABLET | ORAL | 0 refills | Status: DC

## 2022-04-19 MED ORDER — LOSARTAN POTASSIUM 25 MG PO TABS: 25.0000 mg | ORAL_TABLET | Freq: Every day | ORAL | 6 refills | Status: DC

## 2022-04-19 NOTE — TOC Initial Note (Signed)
Transition of Care (TOC) - Initial/Assessment Note  ? ? ?Patient Details  ?Name: Caroline Zamora ?MRN: [id number] ?Date of Birth: 07-Oct-1941 ? ?Transition of Care (TOC) CM/SW Contact:    ?Graves-Bigelow, Ocie Cornfield, RN ?Phone Number: ?04/19/2022, 11:39 AM ? ?Clinical Narrative: Patient ready to transition home today. Patient is agreeable to Outpatient PT-Neuro Rehab with [facility]. Case Manager submitted an ambulatory referral outpatient. The office will call the patient with a scheduled visit time. DME Rolling walker and Shower chair ordered via [employer]-to be delivered to the room. Patient has transportation home. No further needs from Case Manager at this time.           ? ? ?Expected Discharge Plan: Home/Self Care ?Barriers to Discharge: No Barriers Identified ? ? ?Patient Goals and CMS Choice ?Patient states their goals for this hospitalization and ongoing recovery are:: to return home ?  ?Choice offered to / list presented to : NA ? ?Expected Discharge Plan and Services ?Expected Discharge Plan: Home/Self Care ?In-house Referral: NA ?Discharge Planning Services: CM Consult ?Post Acute Care Choice: NA ?Living arrangements for the past 2 months: [place] ?Expected Discharge Date: 04/19/22               ?DME Arranged: Shower stool, Walker rolling ?DME Agency: [employer] ?Date DME Agency Contacted: 04/19/22 ?Time DME Agency Contacted: [id number] ?Representative spoke with at DME Agency: Annie Sable ?HH Arranged: NA ?  ?  ?  ?  ? ?Prior Living Arrangements/Services ?Living arrangements for the past 2 months: [place] ?Lives with:: Self ?Patient language and need for interpreter reviewed:: Yes ?Do you feel safe going back to the place where you live?: Yes      ?Need for Family Participation in Patient Care: Yes (Comment) ?Care giver support system in place?: Yes (comment) ?Current home services: DME (has a rolling walker no wheels.) ?Criminal Activity/Legal Involvement Pertinent to Current  Situation/Hospitalization: No - Comment as needed ? ?Activities of Daily Living ?Home Assistive Devices/Equipment: Shower chair with back, Walker (specify type) ?ADL Screening (condition at time of admission) ?Patient's cognitive ability adequate to safely complete daily activities?: Yes ?Is the patient deaf or have difficulty hearing?: No ?Does the patient have difficulty seeing, even when wearing glasses/contacts?: No ?Does the patient have difficulty concentrating, remembering, or making decisions?: No ?Patient able to express need for assistance with ADLs?: Yes ?Does the patient have difficulty dressing or bathing?: No ?Independently performs ADLs?: Yes (appropriate for developmental age) ?Does the patient have difficulty walking or climbing stairs?: Yes ?Weakness of Legs: Both ?Weakness of Arms/Hands: None ? ?Permission Sought/Granted ?Permission sought to share information with : Case Manager ?Permission granted to share information with : Yes, Verbal Permission Granted ?   ? Permission granted to share info w AGENCY: [employer], [facility] ?   ?   ? ?Emotional Assessment ?Appearance:: Appears stated age ?Attitude/Demeanor/Rapport: Engaged ?Affect (typically observed): Appropriate ?Orientation: : Oriented to Self, Oriented to Place, Oriented to Situation, Oriented to  Time ?Alcohol / Substance Use: Not Applicable ?Psych Involvement: No (comment) ? ?Admission diagnosis:  A-fib ([place]) [I48.91] ?Syncope [R55] ?Patient Active Problem List  ? Diagnosis Date Noted  ? Syncope 04/15/2022  ? Syncope and collapse 04/14/2022  ? Depression 04/14/2022  ? HFrEF (heart failure with reduced ejection fraction) ([place]) 04/14/2022  ? Atrial fibrillation with rapid ventricular response ([place]) 04/13/2022  ? A-fib ([place]) 07/04/2019  ? ?PCP:  Kirk Ruths, MD ?Pharmacy:   ?[id number] [place]?  [place] [address] ?Phone: [phone] Fax: [phone] ? ? ? ?Readmission Risk  Interventions ?   ? View : No data to display.  ?  ?  ?  ? ? ? ?

## 2022-04-19 NOTE — Progress Notes (Signed)
Hospital to apply. ?Dr. Caryl Comes to read. ?

## 2022-04-19 NOTE — Evaluation (Addendum)
Physical Therapy Evaluation ?Patient Details ?Name: Caroline Zamora ?MRN: [id number] ?DOB: 07-Sep-1941 ?Today's Date: 04/19/2022 ? ?History of Present Illness ? The pt is a 81 yo female presenting 5/11 with near syncopal event resulting in a fall. Transferred to [facility] 5/13 due to recurrent afib, PVCs, and cardiomyopathy. 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 PMH includes: afib, HTN, CKD, depression, migraines, and arthritis. ?  ?Clinical Impression ? Pt in bed upon arrival of PT, agreeable to evaluation at this time. Prior to admission the pt was ambulating with use of SPC in the home due to R ankle fx in 11/22, reports frequent dizzy spells, often resulting in falls. The pt has been independent with dressing and cooking, but fearful of shower due to dizziness, and therefore relying on sponge baths recently. The pt now presents with limitations in functional mobility, endurance, dynamic stability, and strength due to above dx, and will continue to benefit from skilled PT to address these deficits. BP remained stable with all changes in position and after activity, pt reports most significant dizziness with transition from sidelying to sitting and sitting to sidelying. Pt also with slight dizziness with head horizontal and vertical head turns with gait. No overt LOB during session, pt able to complete hallway ambulation and stairs with supervision for safety and use of RW. Recommend OP vestibular PT evaluation as well as RW and shower seat to improve safety with mobility and self-care in the home, but pt is safe to return home with family assist and supervision when medically cleared.  ? ?VITALS:  ?- supine in bed- BP: 107/82 (89); ?- sitting EOB - BP: 112/79 (90);  ?- standing - BP: 109/63 (77); ?- standing after 3 min - BP: 108/74 (84);  ?- after ambulation - 114/74 (86) ?   ? ?Recommendations for follow up therapy are one component of a multi-disciplinary discharge planning process, led by the attending  physician.  Recommendations may be updated based on patient status, additional functional criteria and insurance authorization. ? ?Follow Up Recommendations Outpatient PT (outpatient vestibular PT) ? ?  ?Assistance Recommended at Discharge Intermittent Supervision/Assistance  ?Patient can return home with the following ? Assistance with cooking/housework;Help with stairs or ramp for entrance;Assist for transportation ? ?  ?Equipment Recommendations Rolling walker (2 wheels) (shower seat)  ?Recommendations for Other Services ?    ?  ?Functional Status Assessment Patient has had a recent decline in their functional status and demonstrates the ability to make significant improvements in function in a reasonable and predictable amount of time.  ? ?  ?Precautions / Restrictions Precautions ?Precautions: Fall ?Precaution Comments: requent falls and recurrent syncope ?Restrictions ?Weight Bearing Restrictions: Yes ?RLE Weight Bearing: Weight bearing as tolerated  ? ?  ? ?Mobility ? Bed Mobility ?Overal bed mobility: Independent ?  ?  ?  ?  ?  ?  ?General bed mobility comments: reports dizziness with sidelying to sitting and sit to sidelying, no assist ?  ? ?Transfers ?Overall transfer level: Needs assistance ?Equipment used: None ?Transfers: Sit to/from Stand ?Sit to Stand: Supervision ?  ?  ?  ?  ?  ?General transfer comment: supervision for safety ?  ? ?Ambulation/Gait ?Ambulation/Gait assistance: Supervision ?Gait Distance (Feet): 300 Feet ?Assistive device: Rolling walker (2 wheels) ?Gait Pattern/deviations: Step-to pattern, Decreased stance time - right, Decreased weight shift to right, Antalgic ?Gait velocity: 0.36 m/s ?Gait velocity interpretation: <1.31 ft/sec, indicative of household ambulator ?  ?General Gait Details: pt with antalgic gait due to RLE pain. no  LOB ? ?Stairs ?Stairs: Yes ?Stairs assistance: Min guard ?Stair Management: One rail Left, Step to pattern, Forwards ?Number of Stairs: 4 ?General stair  comments: cues for LLE leading ascending and RLE descending first ?  ? ?Balance Overall balance assessment: Needs assistance, History of Falls ?Sitting-balance support: No upper extremity supported, Feet supported ?Sitting balance-Leahy Scale: Good ?  ?  ?Standing balance support: No upper extremity supported, Bilateral upper extremity supported, During functional activity ?Standing balance-Leahy Scale: Fair ?Standing balance comment: no UE support for static stance, BUE support for pain management with gait ?  ?  ?  ?  ?  ?  ?  ?  ?  ?  ?  ?   ? ? ? ?Pertinent Vitals/Pain Pain Assessment ?Pain Assessment: No/denies pain  ? ? ?Home Living Family/patient expects to be discharged to:: Private residence ?Living Arrangements: Alone ?Available Help at Discharge: Family;Available 24 hours/day ?Type of Home: House ?Home Access: Stairs to enter ?Entrance Stairs-Rails: Left ?Entrance Stairs-Number of Steps: 4 ?Alternate Level Stairs-Number of Steps: 6-7 then turn, then 6-7 more ?Home Layout: Two level;1/2 bath on main level;Bed/bath upstairs ?Home Equipment: Standard Walker;Cane - single point;Crutches ?Additional Comments: had been using SPC for 2-3 weeks most reccently, ankle fx in novemeber  ?  ?Prior Function Prior Level of Function : Needs assist;History of Falls (last six months) ?  ?  ?  ?Physical Assist : Mobility (physical);ADLs (physical) ?  ?ADLs (physical): IADLs ?Mobility Comments: pt using SPC, able to move independently in the home. worries about community distances so relies on family for transportation. finished with PT ?ADLs Comments: pt independent with dressing, cooking, worried about taking showers and has been doing bird baths, not driving or running errands, relies on family ?  ? ? ?Hand Dominance  ? Dominant Hand: Right ? ?  ?Extremity/Trunk Assessment  ? Upper Extremity Assessment ?Upper Extremity Assessment: Overall WFL for tasks assessed ?  ? ?Lower Extremity Assessment ?Lower Extremity Assessment:  RLE deficits/detail;Overall [facility] for tasks assessed ?RLE Deficits / Details: hx of ankle fx in november with limited ROM, still using cane to manage pain. numbness and tingling near surgical site ?  ? ?Cervical / Trunk Assessment ?Cervical / Trunk Assessment: Normal  ?Communication  ? Communication: No difficulties  ?Cognition Arousal/Alertness: Awake/alert ?Behavior During Therapy: [facility] for tasks assessed/performed ?Overall Cognitive Status: Within Functional Limits for tasks assessed ?  ?  ?  ?  ?  ?  ?  ?  ?  ?  ?  ?  ?  ?  ?  ?  ?  ?  ?  ? ?  ?General Comments General comments (skin integrity, edema, etc.): BP stable with all changes in position ? ?  ?   ? ?Assessment/Plan  ?  ?PT Assessment Patient needs continued PT services  ?PT Problem List Decreased activity tolerance;Decreased balance;Decreased mobility ? ?   ?  ?PT Treatment Interventions Gait training;DME instruction;Stair training;Functional mobility training;Therapeutic activities;Therapeutic exercise;Balance training;Patient/family education   ? ?PT Goals (Current goals can be found in the Care Plan section)  ?Acute Rehab PT Goals ?Patient Stated Goal: return home ?PT Goal Formulation: With patient ?Time For Goal Achievement: 05/03/22 ?Potential to Achieve Goals: Good ? ?  ?Frequency Min 3X/week ?  ? ? ?   ?AM-PAC PT "6 Clicks" Mobility  ?Outcome Measure Help needed turning from your back to your side while in a flat bed without using bedrails?: None ?Help needed moving from lying on your back to sitting on  the side of a flat bed without using bedrails?: None ?Help needed moving to and from a bed to a chair (including a wheelchair)?: A Little ?Help needed standing up from a chair using your arms (e.g., wheelchair or bedside chair)?: A Little ?Help needed to walk in hospital room?: A Little ?Help needed climbing 3-5 steps with a railing? : A Little ?6 Click Score: 20 ? ?  ?End of Session Equipment Utilized During Treatment: Gait belt ?Activity Tolerance:  Patient tolerated treatment well ?Patient left: in chair;with call bell/phone within reach;with chair alarm set ?Nurse Communication: Mobility status ?PT Visit Diagnosis: Unsteadiness on feet (R26.81);Repeated

## 2022-04-19 NOTE — Discharge Summary (Addendum)
? ? ?ELECTROPHYSIOLOGY DISCHARGE SUMMARY  ? ? ?Patient ID: Caroline Zamora,  ?MRN: [id number], DOB/AGE: [birth year] 81 y.o. ? ?Admit date: 04/15/2022 ?Discharge date: 04/19/2022 ? ?Primary Care Physician: Kirk Ruths, MD  ?Primary Cardiologist: None  ?Electrophysiologist: Dr. Caryl Comes ? ?Primary Discharge Diagnosis:  ?Syncope ?Orthostatic hypotension ?BPPV ? ?Secondary Discharge Diagnosis:  ?Paroxysmal atrial fibrillation  ?Paroxysmal atrial flutter ?PVCs ?Chronic systolic CHF ?Non-obstructive CAD ? ?Procedures This Admission:  ?Echo 04/17/2022 with 30% RCA lesion ? ?cMRI 04/18/2022 ?1.  Normal LV size with mild systolic dysfunction (EF 86%) ?2.  Normal RV size and systolic function (EF 57%) ?3.  No late gadolinium enhancement to suggest myocardial scar ? ?Brief HPI: ?Caroline Zamora is a 81 y.o. female with a history of recurrent syncopal episode admitted for work up of same.  ? ?Hospital Course:  ?The patient was admitted with syncope when getting up from a seated position. Orthostatic vital signs were positive.  They were monitored on telemetry overnight which demonstrated NSR with PVCs and atrial fibrillation with occasional RVR. She was started on IV amiodarone and converted to NSR.  She underwent LHC and cMRI as above with no significant structural heart disease and EF outside of range for ICD.  ? ?She has only ever one a 3 day holter monitor, and with possible need for device in the future, decision made to have pt wear a live monitor and see Dr. Caryl Comes back in office to discuss her work up thus far.  Lasix held.  ? ?Referral made to outpatient vestibular rehab as well.  ? ?   The patient was examined and considered to be stable for discharge.  Wound care and restrictions were reviewed with the patient.  The patient Ramelo Oetken be seen back by  Dr. Caryl Comes in 2-3 weeks for post hospital care, wearing a live monitor in the interim.  ? ?Physical Exam: ?Vitals:  ? 04/19/22 0021 04/19/22 0349 04/19/22 0726 04/19/22  0900  ?BP: 125/78 134/85 125/71   ?Pulse: 67 74 68   ?Resp: 19 16    ?Temp: 98.3 ?F (36.8 ?C) 98.1 ?F (36.7 ?C) 97.6 ?F (36.4 ?C)   ?TempSrc: Oral Oral Oral   ?SpO2: 100% 100% 97% 97%  ?Weight:  78 kg    ? ? ?GEN- The patient is well appearing, alert and oriented x 3 today.   ?HEENT: normocephalic, atraumatic; sclera clear, conjunctiva pink; hearing intact; oropharynx clear; neck supple  ?Lungs- Clear to ausculation bilaterally, normal work of breathing.  No wheezes, rales, rhonchi ?Heart- Regular rate and rhythm, no murmurs, rubs or gallops  ?GI- soft, non-tender, non-distended, bowel sounds present  ?Extremities- no clubbing, cyanosis, or edema; DP/PT/radial pulses 2+ bilaterally, groin without hematoma/bruit ?MS- no significant deformity or atrophy ?Skin- warm and dry, no rash or lesion ?Psych- euthymic mood, full affect ?Neuro- strength and sensation are intact ? ? ?Labs: ?  ?Lab Results  ?Component Value Date  ? WBC 5.8 04/19/2022  ? HGB 11.6 (L) 04/19/2022  ? HCT 35.2 (L) 04/19/2022  ? MCV 85.4 04/19/2022  ? PLT 254 04/19/2022  ?  ?Recent Labs  ?Lab 04/14/22 ?0438 04/14/22 ?8469 04/19/22 ?0339  ?NA 139   < > 136  ?K 6.2*   < > 3.7  ?CL 118*   < > 101  ?CO2 21*   < > 28  ?BUN 12   < > 17  ?CREATININE 0.64   < > 0.97  ?CALCIUM 6.4*   < > 8.7*  ?PROT 4.8*  --   --   ?  BILITOT 0.4  --   --   ?ALKPHOS 57  --   --   ?ALT 8  --   --   ?AST 13*  --   --   ?GLUCOSE 116*   < > 93  ? < > = values in this interval not displayed.  ? ? ? ?Discharge Medications:  ?Allergies as of 04/19/2022   ? ?   Reactions  ? Desyrel [trazodone] Rash  ? Sulfa Antibiotics Rash  ? ?  ? ?  ?Medication List  ?  ? ?STOP taking these medications   ? ?diltiazem 240 MG 24 hr capsule ?Commonly known as: CARDIZEM CD ?  ?furosemide 20 MG tablet ?Commonly known as: LASIX ?  ? ?  ? ?TAKE these medications   ? ?acetaminophen 500 MG tablet ?Commonly known as: TYLENOL ?Take 500 mg by mouth every 6 (six) hours as needed for mild pain. ?  ?amiodarone 200 MG  tablet ?Commonly known as: PACERONE ?Take 2 tablets (400 mg total) by mouth 2 (two) times daily for 7 days, THEN 1 tablet (200 mg total) 2 (two) times daily for 7 days, THEN 1 tablet (200 mg total) daily. ?Start taking on: Apr 19, 2022 ?  ?apixaban 5 MG Tabs tablet ?Commonly known as: ELIQUIS ?Take 5 mg by mouth 2 (two) times daily. ?  ?carvedilol 25 MG tablet ?Commonly known as: COREG ?Take 25 mg by mouth 2 (two) times daily. ?  ?estradiol 0.5 MG tablet ?Commonly known as: ESTRACE ?Take 0.5 mg by mouth at bedtime. ?  ?hydrOXYzine 25 MG tablet ?Commonly known as: ATARAX ?Take 25 mg by mouth 2 (two) times daily as needed for anxiety. ?  ?losartan 25 MG tablet ?Commonly known as: COZAAR ?Take 1 tablet (25 mg total) by mouth daily. ?  ?mometasone 50 MCG/ACT nasal spray ?Commonly known as: NASONEX ?Place 2 sprays into the nose daily as needed (allergies). ?  ?venlafaxine XR 150 MG 24 hr capsule ?Commonly known as: EFFEXOR-XR ?Take 150 mg by mouth daily. ?  ? ?  ? ? ?Disposition:  ? ? Follow-up Information   ? ? Deboraha Sprang, MD Follow up.   ?Specialty: Cardiology ?Why: on 6/1 at 1040 for post hospital follow up ?Contact information: ?[address] ?[address] ?[place] [address] ?[phone] ? ? ?  ?  ? ?  ?  ? ?  ? ? ?Duration of Discharge Encounter: Greater than 30 minutes including physician time. ? ?Signed, ?Shirley Friar, PA-C  ?04/19/2022 ?10:58 AM ? ? ?I have seen and examined this patient with Oda Kilts.  Agree with above, note added to reflect my findings.  Patient admitted to the hospital with recurrent syncopal episodes.  Cardiac MRI showed an ejection fraction of 42% without late gadolinium or scar. ? ?GEN: Well nourished, well developed, in no acute distress  ?HEENT: normal  ?Neck: no JVD, carotid bruits, or masses ?Cardiac: RRR; no murmurs, rubs, or gallops,no edema  ?Respiratory:  clear to auscultation bilaterally, normal work of breathing ?GI: soft, nontender, nondistended, +  BS ?MS: no deformity or atrophy  ?Skin: warm and dry ?Neuro:  Strength and sensation are intact ?Psych: euthymic mood, full affect  ? ?Syncope: Unclear as to the cause.  We Tauheed Mcfayden have her wear a 14-day monitor and have her follow-up with her primary cardiologist in clinic. ?Atrial fibrillation/flutter: Currently undergoing amiodarone load.  Anticoagulation and amiodarone per primary cardiology. ? ?Lewis Keats M. Allyce Bochicchio MD ?04/19/2022 ?1:25 PM  ?

## 2022-04-21 ENCOUNTER — Telehealth: Payer: Self-pay | Admitting: Cardiology

## 2022-04-21 NOTE — Telephone Encounter (Signed)
Cardiology On-Call Notified by [employer] of first documented episode of AF. Episode lasted 90 seconds. Pt has known PAF and is already on eliquis for CVA prophylaxis. Rudean Curt, MD , [facility] 04/21/22 6:02 AM

## 2022-05-04 ENCOUNTER — Encounter: Payer: Self-pay | Admitting: Internal Medicine

## 2022-05-04 ENCOUNTER — Ambulatory Visit: Payer: Medicare HMO | Admitting: Internal Medicine

## 2022-05-04 VITALS — BP 95/60 | HR 73 | Ht 66.0 in | Wt 167.0 lb

## 2022-05-04 DIAGNOSIS — I480 Paroxysmal atrial fibrillation: Secondary | ICD-10-CM | POA: Diagnosis not present

## 2022-05-04 DIAGNOSIS — I4892 Unspecified atrial flutter: Secondary | ICD-10-CM

## 2022-05-04 DIAGNOSIS — R55 Syncope and collapse: Secondary | ICD-10-CM | POA: Diagnosis not present

## 2022-05-04 MED ORDER — LOSARTAN POTASSIUM 25 MG PO TABS: 12.5000 mg | ORAL_TABLET | Freq: Every day | ORAL | 6 refills | Status: DC

## 2022-05-04 MED ORDER — CARVEDILOL 3.125 MG PO TABS: 3.1250 mg | ORAL_TABLET | Freq: Two times a day (BID) | ORAL | 6 refills | Status: DC

## 2022-05-04 NOTE — Progress Notes (Signed)
Patient Care Team: Kirk Ruths, MD as PCP - General (Internal Medicine)   HPI  Caroline Zamora is a 81 y.o. female seen in follow-up for syncope and complex ventricular ectopy  Hospitalized for syncope following my initial evaluation, found to have orthostatic hypotension, underwent catheterization and cMRI and was discharged with recommendation for monitoring  Has had an interval event while she was wearing the monitor, ZIO-AT device.  No calls were made.    Reviewing her history of syncope, all of them of occur shortly after standing.  Some of them are presyncopal and she is able to sit down and abort them.  She has had residual orthostatic intolerance and recurrent syncope.  The recovery phases of all are the same.    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[place])    Depression    Dysphagia    Headache    migraine   Hx of adenomatous colonic polyps    Hyperlipidemia    Hypertension    PAF (paroxysmal atrial fibrillation) (HCC)    Paroxysmal atrial fibrillation ([place])    Skin cancer    Syncope     Past Surgical History:  Procedure Laterality Date   ABDOMINAL HYSTERECTOMY     with removal of tubes and ovaries   ANKLE FRACTURE SURGERY Right    COLONOSCOPY     COLONOSCOPY WITH PROPOFOL N/A 09/03/2017   Procedure: COLONOSCOPY WITH PROPOFOL;  Surgeon: Manya Silvas, MD;  [facility] ENDOSCOPY;  Service: Endoscopy;  Laterality: N/A;   COLONOSCOPY WITH PROPOFOL N/A 09/13/2020   Procedure: COLONOSCOPY WITH PROPOFOL;  Surgeon: Lesly Rubenstein, MD;  Location: [facility] ENDOSCOPY;  Service: Endoscopy;  Laterality: N/A;   DILATION AND CURETTAGE OF UTERUS      ESOPHAGOGASTRODUODENOSCOPY     ESOPHAGOGASTRODUODENOSCOPY (EGD) WITH PROPOFOL N/A 09/13/2020   Procedure: ESOPHAGOGASTRODUODENOSCOPY (EGD) WITH PROPOFOL;  Surgeon: Lesly Rubenstein, MD;  Location: [facility] ENDOSCOPY;  Service: Endoscopy;  Laterality: N/A;   LEFT HEART CATH AND CORONARY ANGIOGRAPHY N/A 04/17/2022   Procedure: LEFT HEART CATH AND CORONARY ANGIOGRAPHY;  Surgeon: Lorretta Harp, MD;  Location: [place] [facility];  Service: Cardiovascular;  Laterality: N/A;   skin cancer removal     TONSILLECTOMY     TUBAL LIGATION      Current Meds  Medication Sig   acetaminophen (TYLENOL) 500 MG tablet Take 500 mg by mouth every 6 (six) hours as needed for mild pain.   amiodarone (PACERONE) 200 MG tablet Take 200 mg by mouth daily.   apixaban (ELIQUIS) 5 MG TABS tablet Take 5 mg by mouth 2 (two) times daily.   carvedilol (COREG) 25 MG tablet Take 25 mg by mouth 2 (two) times daily.   estradiol (ESTRACE) 0.5 MG tablet Take 0.5 mg by mouth at bedtime.   hydrOXYzine (ATARAX) 25 MG tablet Take 25 mg by mouth 2 (two) times daily as needed for anxiety.   losartan (COZAAR) 25 MG tablet Take 1  tablet (25 mg total) by mouth daily.   mometasone (NASONEX) 50 MCG/ACT nasal spray Place 2 sprays into the nose daily as needed (allergies).   venlafaxine XR (EFFEXOR-XR) 150 MG 24 hr capsule Take 150 mg by mouth daily.    Allergies  Allergen Reactions   Desyrel [Trazodone] Rash   Sulfa Antibiotics Rash      Review of Systems negative except from HPI and PMH  Physical Exam BP 95/60 (BP Location: Right Arm, Patient Position: Sitting, Cuff Size: Large)   Pulse 73   Ht '5\' 6"'$  (1.676 m)   Wt 167 lb (75.8 kg)   LMP  (LMP Unknown)   SpO2 97%   BMI 26.95 kg/m  Well developed and well nourished in no acute distress HENT normal E scleral and icterus clear Neck Supple JVP flat; carotids brisk and full Clear to ausculation Regular rate and rhythm, no murmurs gallops or rub Soft with active bowel  sounds No clubbing cyanosis swollen right foot edema Alert and oriented, grossly normal motor and sensory function Skin Warm and Dry  ECG sinus at 73 Interval 17/11/46  Estimated Creatinine Clearance: 49.7 mL/min (by C-G formula based on SCr of 0.97 mg/dL).   Assessment and  Plan Syncope abrupt in onset/offset  Orthostatic hypotension   Atrial fibrillation   PVCs-left bundle inferior axis   Dyspnea on exertion   Cardiomyopathy?   Incomplete right bundle branch block   Recurrent syncope strongly consistent with orthostatic syncope and with documentation of a 50 mm blood drop when she was in the hospital.  She is hypotensive today with a blood pressure of 95.  We will repeat the orthostatics today, I think there is sufficient justification particularly in light of the absence of a call from her ZIO AT monitor that this is not an arrhythmic event, hence, we will need therapy directed at restoring blood pressure and preventing orthostasis, using nonpharmacological therapies.  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  Having some atrial fibrillation.  Started on amiodarone.  I worry some about its potential impact aggravate orthostasis, but for now we will continue it.  With her hypotension, we will need to decrease her carvedilol, we will do so to 3.125 mg twice daily and her losartan to 12.5 at bedtime  Current medicines are reviewed at length with the patient today .  The patient does not  have concerns regarding medicines.

## 2022-05-04 NOTE — Patient Instructions (Signed)
Medication Instructions:  - Your physician has recommended you make the following change in your medication:   1) DECREASE Coreg (Carvedilol) to 3.125 mg: - take 1 tablet by mouth TWICE daily  2) DECREASE Cozaar (Losartan) 25 mg: - take 0.5 tablet (12.5 mg) by mouth ONCE daily   *If you need a refill on your cardiac medications before your next appointment, please call your pharmacy*   Lab Work: - none ordered  If you have labs (blood work) drawn today and your tests are completely normal, you will receive your results only by: MyChart Message (if you have MyChart) OR A paper copy in the mail If you have any lab test that is abnormal or we need to change your treatment, we will call you to review the results.   Testing/Procedures: - none ordered   Follow-Up: At [facility], you and your health needs are our priority.  As part of our continuing mission to provide you with exceptional heart care, we have created designated Provider Care Teams.  These Care Teams include your primary Cardiologist (physician) and Advanced Practice Providers (APPs -  Physician Assistants and Nurse Practitioners) who all work together to provide you with the care you need, when you need it.  We recommend signing up for the patient portal called "MyChart".  Sign up information is provided on this After Visit Summary.  MyChart is used to connect with patients for Virtual Visits (Telemedicine).  Patients are able to view lab/test results, encounter notes, upcoming appointments, etc.  Non-urgent messages can be sent to your provider as well.   To learn more about what you can do with MyChart, go to [email or web address].    Your next appointment:   3 month(s)  The format for your next appointment:   In Person  Provider:   Virl Axe, MD    Other Instructions  1) It is recommended that you wear Spanx or an Abominal Binder & Thigh Sleeves during your waking hours  Important Information About  [place]

## 2022-05-05 ENCOUNTER — Telehealth: Payer: Self-pay | Admitting: Internal Medicine

## 2022-05-05 MED ORDER — LOSARTAN POTASSIUM 25 MG PO TABS: 12.5000 mg | ORAL_TABLET | Freq: Every day | ORAL | 2 refills | Status: DC

## 2022-05-05 NOTE — Telephone Encounter (Signed)
Requested Prescriptions   Signed Prescriptions Disp Refills   losartan (COZAAR) 25 MG tablet 45 tablet 2    Sig: Take 0.5 tablets (12.5 mg total) by mouth daily.    Authorizing Provider: Deboraha Sprang    Ordering User: Britt Bottom

## 2022-05-05 NOTE — Telephone Encounter (Signed)
*  STAT* If patient is at the pharmacy, call can be transferred to refill team.   1. Which medications need to be refilled? (please list name of each medication and dose if known) need new prescription for  Losartan 12;5 mg  2. Which pharmacy/location (including street and city if local pharmacy) is medication to be sent to? [facility] [place], [place]  3. Do they need a 30 day or 90 day supply? 90 days and refills

## 2022-05-08 ENCOUNTER — Telehealth: Payer: Self-pay | Admitting: Internal Medicine

## 2022-05-08 NOTE — Telephone Encounter (Signed)
Pt c/o medication issue:  1. Name of Medication: losartan (COZAAR) 25 MG tablet  2. How are you currently taking this medication (dosage and times per day)? 12.5 mg (1/2 tablet) daily  3. Are you having a reaction (difficulty breathing--STAT)? no  4. What is your medication issue? Patient said she has a difficult time splitting the pill in half. She checked with her pharmacy and they do have a 12.5 mg tablet. She was hoping Dr.Klein could just write an rx for the 12.5 mg pills and [id number] [place]

## 2022-05-08 NOTE — Telephone Encounter (Signed)
I called and [facility] [address]  I inquired if they have a Losartan 12.5 mg strength tablet available as I have not heard of this and cannot pull it up as an option in Epic.   Per the pharmacy staff, the lowest strength tablet available is 25 mg of losartan.   I called and spoke with the patient and notified her of the above feedback I received from the pharmacy.  Per the patient, her son inquired about this at the pharmacy and was told it came as a 12.5 mg tablet. She will follow up with her son on this as she is having a really hard cutting the tablets, although they are using a knife and not a pill splitter.  I have advised the patient I will review with Dr. Caryl Comes to see if there is something equivalent to the losartan 12.5 mg dose he wants her on that she doesn't have to split. She is aware I will call back once recommendations are received. The patient voices understanding and is agreeable.

## 2022-05-09 NOTE — Telephone Encounter (Signed)
The biggest issue right now is the orthostasis; I think we need to control both symptoms before we focus on the long-term benefits of medications given her cardiomyopathy

## 2022-05-09 NOTE — Telephone Encounter (Signed)
Verbal orders received from Dr. Caryl Comes to stop losartan for now.  I have called the patient and advised her she may stop losartan for now in light of her orthostasis. She is aware Dr. Caryl Comes will readdress her BP at her follow up appointment with him in August 2023.  The patient voices understanding and is agreeable.  She was appreciative of the call back and aware to call back in the interim if needed.

## 2022-05-10 ENCOUNTER — Other Ambulatory Visit: Payer: Self-pay | Admitting: Student

## 2022-05-10 NOTE — Telephone Encounter (Signed)
I spoke with pt she is taking Amiodarone 200 mg qd. Pt would like 90 day refill. Please advise if ok to refill historical medication.

## 2022-05-15 ENCOUNTER — Encounter: Payer: Self-pay | Admitting: Emergency Medicine

## 2022-05-15 ENCOUNTER — Emergency Department: Payer: Medicare HMO

## 2022-05-15 ENCOUNTER — Observation Stay
Admission: EM | Admit: 2022-05-15 | Discharge: 2022-05-17 | Disposition: A | Discharge status: Home Health | Payer: Medicare HMO | Attending: Internal Medicine | Admitting: Internal Medicine

## 2022-05-15 ENCOUNTER — Other Ambulatory Visit: Payer: Self-pay

## 2022-05-15 ENCOUNTER — Telehealth: Payer: Self-pay | Admitting: Internal Medicine

## 2022-05-15 DIAGNOSIS — H8110 Benign paroxysmal vertigo, unspecified ear: Secondary | ICD-10-CM | POA: Insufficient documentation

## 2022-05-15 DIAGNOSIS — N390 Urinary tract infection, site not specified: Secondary | ICD-10-CM | POA: Insufficient documentation

## 2022-05-15 DIAGNOSIS — R1310 Dysphagia, unspecified: Secondary | ICD-10-CM | POA: Insufficient documentation

## 2022-05-15 DIAGNOSIS — R296 Repeated falls: Secondary | ICD-10-CM | POA: Diagnosis not present

## 2022-05-15 DIAGNOSIS — Z7901 Long term (current) use of anticoagulants: Secondary | ICD-10-CM | POA: Diagnosis not present

## 2022-05-15 DIAGNOSIS — I482 Chronic atrial fibrillation, unspecified: Secondary | ICD-10-CM | POA: Diagnosis not present

## 2022-05-15 DIAGNOSIS — Z8249 Family history of ischemic heart disease and other diseases of the circulatory system: Secondary | ICD-10-CM | POA: Insufficient documentation

## 2022-05-15 DIAGNOSIS — I480 Paroxysmal atrial fibrillation: Secondary | ICD-10-CM | POA: Diagnosis not present

## 2022-05-15 DIAGNOSIS — N182 Chronic kidney disease, stage 2 (mild): Secondary | ICD-10-CM | POA: Insufficient documentation

## 2022-05-15 DIAGNOSIS — R531 Weakness: Secondary | ICD-10-CM | POA: Insufficient documentation

## 2022-05-15 DIAGNOSIS — M6281 Muscle weakness (generalized): Secondary | ICD-10-CM | POA: Diagnosis not present

## 2022-05-15 DIAGNOSIS — S0993XA Unspecified injury of face, initial encounter: Secondary | ICD-10-CM | POA: Insufficient documentation

## 2022-05-15 DIAGNOSIS — R2689 Other abnormalities of gait and mobility: Secondary | ICD-10-CM | POA: Diagnosis not present

## 2022-05-15 DIAGNOSIS — Z23 Encounter for immunization: Secondary | ICD-10-CM | POA: Insufficient documentation

## 2022-05-15 DIAGNOSIS — A419 Sepsis, unspecified organism: Secondary | ICD-10-CM | POA: Insufficient documentation

## 2022-05-15 DIAGNOSIS — R519 Headache, unspecified: Secondary | ICD-10-CM | POA: Insufficient documentation

## 2022-05-15 DIAGNOSIS — R197 Diarrhea, unspecified: Secondary | ICD-10-CM | POA: Insufficient documentation

## 2022-05-15 DIAGNOSIS — Z91148 Patient's other noncompliance with medication regimen for other reason: Secondary | ICD-10-CM | POA: Diagnosis not present

## 2022-05-15 DIAGNOSIS — S00511A Abrasion of lip, initial encounter: Secondary | ICD-10-CM | POA: Diagnosis not present

## 2022-05-15 DIAGNOSIS — I2510 Atherosclerotic heart disease of native coronary artery without angina pectoris: Secondary | ICD-10-CM | POA: Diagnosis not present

## 2022-05-15 DIAGNOSIS — R55 Syncope and collapse: Secondary | ICD-10-CM | POA: Diagnosis present

## 2022-05-15 DIAGNOSIS — I959 Hypotension, unspecified: Secondary | ICD-10-CM | POA: Insufficient documentation

## 2022-05-15 DIAGNOSIS — S60519A Abrasion of unspecified hand, initial encounter: Secondary | ICD-10-CM | POA: Insufficient documentation

## 2022-05-15 DIAGNOSIS — I130 Hypertensive heart and chronic kidney disease with heart failure and stage 1 through stage 4 chronic kidney disease, or unspecified chronic kidney disease: Secondary | ICD-10-CM | POA: Insufficient documentation

## 2022-05-15 DIAGNOSIS — I4510 Unspecified right bundle-branch block: Secondary | ICD-10-CM | POA: Diagnosis not present

## 2022-05-15 DIAGNOSIS — D72829 Elevated white blood cell count, unspecified: Secondary | ICD-10-CM | POA: Insufficient documentation

## 2022-05-15 DIAGNOSIS — Z85828 Personal history of other malignant neoplasm of skin: Secondary | ICD-10-CM | POA: Insufficient documentation

## 2022-05-15 DIAGNOSIS — Z602 Problems related to living alone: Secondary | ICD-10-CM | POA: Insufficient documentation

## 2022-05-15 DIAGNOSIS — N3000 Acute cystitis without hematuria: Secondary | ICD-10-CM | POA: Insufficient documentation

## 2022-05-15 DIAGNOSIS — R7989 Other specified abnormal findings of blood chemistry: Secondary | ICD-10-CM | POA: Insufficient documentation

## 2022-05-15 DIAGNOSIS — T45516A Underdosing of anticoagulants, initial encounter: Secondary | ICD-10-CM | POA: Diagnosis not present

## 2022-05-15 DIAGNOSIS — Z79899 Other long term (current) drug therapy: Secondary | ICD-10-CM | POA: Diagnosis not present

## 2022-05-15 DIAGNOSIS — I5020 Unspecified systolic (congestive) heart failure: Secondary | ICD-10-CM | POA: Diagnosis present

## 2022-05-15 DIAGNOSIS — I951 Orthostatic hypotension: Secondary | ICD-10-CM | POA: Insufficient documentation

## 2022-05-15 DIAGNOSIS — S0031XA Abrasion of nose, initial encounter: Secondary | ICD-10-CM | POA: Diagnosis not present

## 2022-05-15 DIAGNOSIS — W19XXXA Unspecified fall, initial encounter: Secondary | ICD-10-CM | POA: Diagnosis not present

## 2022-05-15 DIAGNOSIS — I5022 Chronic systolic (congestive) heart failure: Secondary | ICD-10-CM | POA: Diagnosis not present

## 2022-05-15 DIAGNOSIS — Z85038 Personal history of other malignant neoplasm of large intestine: Secondary | ICD-10-CM | POA: Diagnosis not present

## 2022-05-15 DIAGNOSIS — R000 Tachycardia, unspecified: Secondary | ICD-10-CM | POA: Insufficient documentation

## 2022-05-15 DIAGNOSIS — T148XXA Other injury of unspecified body region, initial encounter: Secondary | ICD-10-CM | POA: Insufficient documentation

## 2022-05-15 DIAGNOSIS — Z9181 History of falling: Secondary | ICD-10-CM | POA: Insufficient documentation

## 2022-05-15 DIAGNOSIS — Z8781 Personal history of (healed) traumatic fracture: Secondary | ICD-10-CM | POA: Insufficient documentation

## 2022-05-15 DIAGNOSIS — I4891 Unspecified atrial fibrillation: Secondary | ICD-10-CM | POA: Diagnosis present

## 2022-05-15 LAB — CBC
HCT: 38.2 % (ref 36.0–46.0)
Hemoglobin: 12.4 g/dL (ref 12.0–15.0)
MCH: 27.1 pg (ref 26.0–34.0)
MCHC: 32.5 g/dL (ref 30.0–36.0)
MCV: 83.4 fL (ref 80.0–100.0)
Platelets: 340 10*3/uL (ref 150–400)
RBC: 4.58 MIL/uL (ref 3.87–5.11)
RDW: 14.6 % (ref 11.5–15.5)
WBC: 12.6 10*3/uL — ABNORMAL HIGH (ref 4.0–10.5)
nRBC: 0 % (ref 0.0–0.2)

## 2022-05-15 LAB — URINALYSIS, ROUTINE W REFLEX MICROSCOPIC
Bilirubin Urine: NEGATIVE
Glucose, UA: NEGATIVE mg/dL
Hgb urine dipstick: NEGATIVE
Ketones, ur: NEGATIVE mg/dL
Nitrite: NEGATIVE
Protein, ur: 100 mg/dL — AB
Specific Gravity, Urine: 1.017 (ref 1.005–1.030)
WBC, UA: >50 WBC/hpf — ABNORMAL HIGH (ref 0–5)
pH: 5 (ref 5.0–8.0)

## 2022-05-15 LAB — BASIC METABOLIC PANEL
Anion gap: 9 (ref 5–15)
BUN: 24 mg/dL — ABNORMAL HIGH (ref 8–23)
CO2: 24 mmol/L (ref 22–32)
Calcium: 7.7 mg/dL — ABNORMAL LOW (ref 8.9–10.3)
Chloride: 99 mmol/L (ref 98–111)
Creatinine, Ser: 1.16 mg/dL — ABNORMAL HIGH (ref 0.44–1.00)
GFR, Estimated: 48 mL/min — ABNORMAL LOW (ref >60)
Glucose, Bld: 128 mg/dL — ABNORMAL HIGH (ref 70–99)
Potassium: 3.5 mmol/L (ref 3.5–5.1)
Sodium: 132 mmol/L — ABNORMAL LOW (ref 135–145)

## 2022-05-15 LAB — TROPONIN I (HIGH SENSITIVITY)
Troponin I (High Sensitivity): 10 ng/L (ref <18)
Troponin I (High Sensitivity): 10 ng/L (ref <18)

## 2022-05-15 LAB — LACTIC ACID, PLASMA: Lactic Acid, Venous: 1.1 mmol/L (ref 0.5–1.9)

## 2022-05-15 MED ORDER — SODIUM CHLORIDE 0.9 % IV SOLN
1.0000 g | Freq: Once | INTRAVENOUS | Status: AC
  Administered 2022-05-16: 1 g via INTRAVENOUS
  Filled 2022-05-15: qty 10

## 2022-05-15 MED ORDER — ESTRADIOL 1 MG PO TABS
0.5000 mg | ORAL_TABLET | Freq: Every day | ORAL | Status: DC
  Administered 2022-05-16 (×2): 0.5 mg via ORAL
  Filled 2022-05-15 (×2): qty 0.5

## 2022-05-15 MED ORDER — ONDANSETRON HCL 4 MG/2ML IJ SOLN: 4.0000 mg | Freq: Four times a day (QID) | INTRAMUSCULAR | Status: DC | PRN

## 2022-05-15 MED ORDER — TETANUS-DIPHTH-ACELL PERTUSSIS 5-2.5-18.5 LF-MCG/0.5 IM SUSY
0.5000 mL | PREFILLED_SYRINGE | Freq: Once | INTRAMUSCULAR | Status: AC
Start: 2022-05-15 — End: 2022-05-15
  Administered 2022-05-15: 0.5 mL via INTRAMUSCULAR
  Filled 2022-05-15: qty 0.5

## 2022-05-15 MED ORDER — APIXABAN 5 MG PO TABS
5.0000 mg | ORAL_TABLET | Freq: Two times a day (BID) | ORAL | Status: DC
  Administered 2022-05-16 – 2022-05-17 (×4): 5 mg via ORAL
  Filled 2022-05-15 (×4): qty 1

## 2022-05-15 MED ORDER — SODIUM CHLORIDE 0.9 % IV BOLUS
1000.0000 mL | Freq: Once | INTRAVENOUS | Status: AC
  Administered 2022-05-15: 1000 mL via INTRAVENOUS

## 2022-05-15 MED ORDER — DILTIAZEM HCL-DEXTROSE 125-5 MG/125ML-% IV SOLN (PREMIX)
5.0000 mg/h | INTRAVENOUS | Status: DC
  Filled 2022-05-15: qty 125

## 2022-05-15 MED ORDER — VENLAFAXINE HCL ER 75 MG PO CP24
150.0000 mg | ORAL_CAPSULE | Freq: Every day | ORAL | Status: DC
  Administered 2022-05-16 – 2022-05-17 (×2): 150 mg via ORAL
  Filled 2022-05-15: qty 2
  Filled 2022-05-15: qty 1

## 2022-05-15 MED ORDER — DILTIAZEM HCL 25 MG/5ML IV SOLN
10.0000 mg | Freq: Once | INTRAVENOUS | Status: AC
  Administered 2022-05-15: 10 mg via INTRAVENOUS
  Filled 2022-05-15: qty 5

## 2022-05-15 MED ORDER — ACETAMINOPHEN 325 MG PO TABS
650.0000 mg | ORAL_TABLET | ORAL | Status: DC | PRN
  Administered 2022-05-16 – 2022-05-17 (×2): 650 mg via ORAL
  Filled 2022-05-15 (×2): qty 2

## 2022-05-15 MED ORDER — AMIODARONE HCL 200 MG PO TABS
200.0000 mg | ORAL_TABLET | Freq: Every day | ORAL | Status: DC
  Administered 2022-05-16 – 2022-05-17 (×2): 200 mg via ORAL
  Filled 2022-05-15 (×2): qty 1

## 2022-05-15 MED ORDER — SODIUM CHLORIDE 0.9 % IV SOLN
1.0000 g | Freq: Once | INTRAVENOUS | Status: AC
  Administered 2022-05-15: 1 g via INTRAVENOUS
  Filled 2022-05-15: qty 10

## 2022-05-15 MED ORDER — SODIUM CHLORIDE 0.9 % IV SOLN: 2.0000 g | INTRAVENOUS | Status: DC

## 2022-05-15 NOTE — ED Provider Triage Note (Signed)
Emergency Medicine Provider Triage Evaluation Note  Caroline Zamora, a 81 y.o. female  was evaluated in triage.  Pt complains of syncopal episode.  Patient with history of the same, presents after she had an unwitnessed syncopal episode falling hitting her's face and head.  And is on Eliquis for her A-fib.  Review of Systems  Positive: Syncope, face injury Negative: CP, SOB  Physical Exam  BP (!) 80/62   Pulse 75   Temp 98.4 F (36.9 C) (Oral)   Resp 18   Ht '5\' 6"'$  (1.676 m)   Wt 74.4 kg   LMP  (LMP Unknown)   SpO2 95%   BMI 26.47 kg/m  Gen:   Awake, no distress  NAD Resp:  Normal effort CTA MSK:   Moves extremities without difficulty  ENT:  Dried blood in the left nare. Lip abrasion noted  Medical Decision Making  Medically screening exam initiated at 5:56 PM.  Appropriate orders placed.  Caroline Zamora was informed that the remainder of the evaluation will be completed by another provider, this initial triage assessment does not replace that evaluation, and the importance of remaining in the ED until their evaluation is complete.  Patient with history of A-fib and heart failure, presents to the ED for evaluation of a syncopal episode resulting in facial injury.  She presents in no acute distress via EMS from home.   Melvenia Needles, PA-C 05/15/22 1759

## 2022-05-15 NOTE — ED Notes (Signed)
Pt had a small smear, brief and pad changed.

## 2022-05-15 NOTE — ED Notes (Signed)
Do not do orthostatic vital signs per Jari Pigg, MD due to HR

## 2022-05-15 NOTE — ED Triage Notes (Signed)
First Nurse Note:  [facility].  C/O weakness to legs and dizziness since Thursday.  Fall last night due to legs 'giving out'.  Per report, pt vs were orthostatic.  12-lead ST.  20g LAC.  500 NS given.  CBG:  156

## 2022-05-15 NOTE — ED Triage Notes (Signed)
Pt via EMS from home. Pt c/o syncopal episode today, pt fell and hit her face. Pt does take Eliquis. States that she doesn't know how long she was out. Pt has bleeding from nose and mouth. Pt is A&OX4 and NAD but states she feels lightheaded and dizzy.

## 2022-05-15 NOTE — Consult Note (Signed)
CODE SEPSIS - PHARMACY COMMUNICATION  **Broad Spectrum Antibiotics should be administered within 1 hour of Sepsis diagnosis**  Time Code Sepsis Called/Page Received: 2050  Antibiotics Ordered: ceftriaxone  Time of 1st antibiotic administration: 2134      Dorothe Pea, PharmD, BCPS Clinical Pharmacist   05/15/2022  8:51 PM

## 2022-05-15 NOTE — Sepsis Progress Note (Signed)
Elink following for Sepsis Protocol 

## 2022-05-15 NOTE — ED Provider Notes (Signed)
[facility] Provider Note    Event Date/Time   First MD Initiated Contact with Patient 05/15/22 1837     (approximate)   History   Loss of Consciousness   HPI  Caroline Zamora is a 81 y.o. female with history of atrial fibrillation on Eliquis who comes in with concerns for syncopal.  Patient has had some recurrent syncopal episodes.  She reports having 2 episodes last night where she just felt really weak and then woke up on the ground.  She denies any chest pain, shortness of breath or other concerns.  She denies ever needed to be cardioverted in the past.  She reports taking her Eliquis but missed a dose yesterday.  She did not miss any doses of her amiodarone.  She does report that she did hit her head but denies any other pain.  I reviewed the note on 5/13 where patient was admitted and found to have normal echocardiogram, started on amiodarone for atrial fibrillation had a left heart cath and cardiac MRI that was overall reassuring.  EF is outside of range of ICD.       Physical Exam   Triage Vital Signs: ED Triage Vitals  Enc Vitals Group     BP 05/15/22 1756 (!) 80/62     Pulse Rate 05/15/22 1756 75     Resp 05/15/22 1756 18     Temp 05/15/22 1756 98.4 F (36.9 C)     Temp Source 05/15/22 1756 Oral     SpO2 05/15/22 1756 95 %     Weight 05/15/22 1753 164 lb (74.4 kg)     Height 05/15/22 1753 '5\' 6"'$  (1.676 m)     Head Circumference --      Peak Flow --      Pain Score 05/15/22 1754 0     Pain Loc --      Pain Edu? --      Excl. in [place]? --     Most recent vital signs: Vitals:   05/15/22 1756  BP: (!) 80/62  Pulse: 75  Resp: 18  Temp: 98.4 F (36.9 C)  SpO2: 95%     General: Awake, no distress.  CV:  Good peripheral perfusion.  No chest wall tenderness Resp:  Normal effort.  Abd:  No distention.  No abdominal tenderness Other:  Patient has some abrasions noted on her nose and a little bit on her hand but no significant  tenderness noted.  No septal hematoma.  She has full palpation of her legs and arms without any tenderness   ED Results / Procedures / Treatments   Labs (all labs ordered are listed, but only abnormal results are displayed) Labs Reviewed  BASIC METABOLIC PANEL - Abnormal; Notable for the following components:      Result Value   Sodium 132 (*)    Glucose, Bld 128 (*)    BUN 24 (*)    Creatinine, Ser 1.16 (*)    Calcium 7.7 (*)    GFR, Estimated 48 (*)    All other components within normal limits  CBC - Abnormal; Notable for the following components:   WBC 12.6 (*)    All other components within normal limits  URINALYSIS, ROUTINE W REFLEX MICROSCOPIC  CBG MONITORING, ED     EKG  My interpretation of EKG:  Atrial fibrillation rate of 135 without any ST elevation or T wave inversions, normal intervals  RADIOLOGY I have reviewed the CT head personally interpreted  without any evidence of intracranial hemorrhage   PROCEDURES:  Critical Care performed: Yes, see critical care procedure note(s)  .Critical Care  Performed by: Vanessa Kimberly, MD Authorized by: Vanessa Greenleaf, MD   Critical care provider statement:    Critical care time (minutes):  30   Critical care was necessary to treat or prevent imminent or life-threatening deterioration of the following conditions:  Cardiac failure   Critical care was time spent personally by me on the following activities:  Development of treatment plan with patient or surrogate, discussions with consultants, evaluation of patient's response to treatment, examination of patient, ordering and review of laboratory studies, ordering and review of radiographic studies, ordering and performing treatments and interventions, pulse oximetry, re-evaluation of patient's condition and review of old charts .1-3 Lead EKG Interpretation  Performed by: Vanessa Potrero, MD Authorized by: Vanessa , MD     Interpretation: normal     ECG rate:  120    Rhythm: atrial fibrillation     Ectopy: none     Conduction: normal      MEDICATIONS ORDERED IN ED: Medications  diltiazem (CARDIZEM) injection 10 mg (has no administration in time range)  sodium chloride 0.9 % bolus 1,000 mL (0 mLs Intravenous Stopped 05/15/22 1849)     IMPRESSION / MDM / ASSESSMENT AND PLAN / ED COURSE  I reviewed the triage vital signs and the nursing notes.   Patient's presentation is most consistent with acute presentation with potential threat to life or bodily function.  Patient has already been given 1.7 L of fluid.  Patient does report that they think that this is most likely related to orthostatic hypotension and the family member is at bedside now he states that she had a GI bug a couple days ago and she just has not been eating and drinking as much as typical.  However patient found to be in A-fib with RVR.  Patient did miss a Eliquis dose.  Patient stating that she thinks she did take them but the son is stating that there was at least 3 to 4 days where the Eliquis dose from the p.m. was still listed and unclear exactly how often she is missing them.  At this time we will hold off on cardioversion and start off with some IV diltiazem.   CBC shows slightly elevated white count.  BMP shows slightly elevated creatinine at 1.16 we will give some IV fluids  IMPRESSION:  1. No evidence of acute intracranial abnormality or facial fracture.  2. No evidence of acute fracture or traumatic malalignment the  cervical spine.   Patient's heart rate came down but shortly after spiking back up between 1 10-1 20.  Patient started on a diltiazem drip.  Her urine looks concerning for UTI and I will send for urine culture.  She meets sepsis criteria with heart rate and elevated white count so blood culture and lactate was ordered and sepsis alert called.  We will start on ceftriaxone given she does report some dysuria.  Will discuss with hospital team for admission  The patient  is on the cardiac monitor to evaluate for evidence of arrhythmia and/or significant heart rate changes.      FINAL CLINICAL IMPRESSION(S) / ED DIAGNOSES   Final diagnoses:  Syncope and collapse  Atrial fibrillation with rapid ventricular response (HCC)  Acute cystitis without hematuria  Sepsis, due to unspecified organism, unspecified whether acute organ dysfunction present [facility])     Rx /  DC Orders   ED Discharge Orders     None        Note:  This document was prepared using Dragon voice recognition software and may include unintentional dictation errors.   Vanessa Gamaliel, MD 05/15/22 2049

## 2022-05-15 NOTE — Telephone Encounter (Signed)
Pt c/o Syncope: STAT if syncope occurred within 30 minutes and pt complains of lightheadedness High Priority if episode of passing out, completely, today or in last 24 hours   Did you pass out today? No   When is the last time you passed out? Last night   Has this occurred multiple times? Yes; broken leg due to syncope   Did you have any symptoms prior to passing out? Dropping in BP when she stand up; dizziness/lightheadedness

## 2022-05-15 NOTE — ED Notes (Signed)
CT tech at bedside to take pt to CT

## 2022-05-15 NOTE — Telephone Encounter (Signed)
Spoke with pt who reports she had 3-4 episodes of syncope last night.  She denies current CP or SOB.  She reports she is afraid to get out of the bed for fear she will pass out.  She has had some food today and a glass of juice and 2 glasses of water.  Pt has adkusted medications as recommended by Dr Caryl Comes at her last OV. RN spoke with Dr Caryl Comes who recommends pt be seen in the ED now due to symptoms.  Pt verbalizes understanding and agrees with current plan.

## 2022-05-16 DIAGNOSIS — N390 Urinary tract infection, site not specified: Secondary | ICD-10-CM

## 2022-05-16 DIAGNOSIS — R531 Weakness: Secondary | ICD-10-CM

## 2022-05-16 DIAGNOSIS — R55 Syncope and collapse: Secondary | ICD-10-CM

## 2022-05-16 DIAGNOSIS — R197 Diarrhea, unspecified: Secondary | ICD-10-CM | POA: Diagnosis present

## 2022-05-16 LAB — BASIC METABOLIC PANEL
Anion gap: 3 — ABNORMAL LOW (ref 5–15)
BUN: 24 mg/dL — ABNORMAL HIGH (ref 8–23)
CO2: 25 mmol/L (ref 22–32)
Calcium: 7.1 mg/dL — ABNORMAL LOW (ref 8.9–10.3)
Chloride: 106 mmol/L (ref 98–111)
Creatinine, Ser: 1.03 mg/dL — ABNORMAL HIGH (ref 0.44–1.00)
GFR, Estimated: 56 mL/min — ABNORMAL LOW (ref >60)
Glucose, Bld: 121 mg/dL — ABNORMAL HIGH (ref 70–99)
Potassium: 3 mmol/L — ABNORMAL LOW (ref 3.5–5.1)
Sodium: 134 mmol/L — ABNORMAL LOW (ref 135–145)

## 2022-05-16 LAB — CBC
HCT: 31.7 % — ABNORMAL LOW (ref 36.0–46.0)
Hemoglobin: 10.4 g/dL — ABNORMAL LOW (ref 12.0–15.0)
MCH: 27 pg (ref 26.0–34.0)
MCHC: 32.8 g/dL (ref 30.0–36.0)
MCV: 82.3 fL (ref 80.0–100.0)
Platelets: 243 10*3/uL (ref 150–400)
RBC: 3.85 MIL/uL — ABNORMAL LOW (ref 3.87–5.11)
RDW: 14.6 % (ref 11.5–15.5)
WBC: 8.6 10*3/uL (ref 4.0–10.5)
nRBC: 0 % (ref 0.0–0.2)

## 2022-05-16 LAB — LIPID PANEL
Cholesterol: 142 mg/dL (ref 0–200)
HDL: 22 mg/dL — ABNORMAL LOW (ref >40)
LDL Cholesterol: 95 mg/dL (ref 0–99)
Total CHOL/HDL Ratio: 6.5 RATIO
Triglycerides: 127 mg/dL (ref <150)
VLDL: 25 mg/dL (ref 0–40)

## 2022-05-16 LAB — LACTIC ACID, PLASMA: Lactic Acid, Venous: 0.7 mmol/L (ref 0.5–1.9)

## 2022-05-16 MED ORDER — POTASSIUM CHLORIDE 20 MEQ PO PACK: 40.0000 meq | PACK | Freq: Every day | ORAL | 0 refills | Status: DC

## 2022-05-16 MED ORDER — SODIUM CHLORIDE 0.9 % IV SOLN
INTRAVENOUS | Status: DC
Start: 2022-05-16 — End: 2022-05-17

## 2022-05-16 MED ORDER — CARVEDILOL 3.125 MG PO TABS
3.1250 mg | ORAL_TABLET | Freq: Two times a day (BID) | ORAL | Status: DC
  Administered 2022-05-16 – 2022-05-17 (×3): 3.125 mg via ORAL
  Filled 2022-05-16 (×3): qty 1

## 2022-05-16 MED ORDER — NITROFURANTOIN MONOHYD MACRO 100 MG PO CAPS
100.0000 mg | ORAL_CAPSULE | Freq: Two times a day (BID) | ORAL | Status: DC
  Administered 2022-05-16 – 2022-05-17 (×2): 100 mg via ORAL
  Filled 2022-05-16 (×2): qty 1

## 2022-05-16 MED ORDER — POTASSIUM CHLORIDE 20 MEQ PO PACK
40.0000 meq | PACK | ORAL | Status: AC
  Administered 2022-05-16 (×2): 40 meq via ORAL
  Filled 2022-05-16: qty 2

## 2022-05-16 MED ORDER — SODIUM CHLORIDE 0.9 % IV SOLN: INTRAVENOUS | Status: AC

## 2022-05-16 MED ORDER — NITROFURANTOIN MONOHYD MACRO 100 MG PO CAPS
100.0000 mg | ORAL_CAPSULE | Freq: Two times a day (BID) | ORAL | 0 refills | Status: DC
Start: 2022-05-16 — End: 2022-05-17

## 2022-05-16 NOTE — Evaluation (Signed)
Physical Therapy Evaluation Patient Details Name: Caroline Zamora MRN: [id number] DOB: November 20, 1941 Today's Date: 05/16/2022  History of Present Illness  Pt is a 81 y.o. female presenting to hospital 05/15/22 d/t concerns of recurrent syncopal episodes.  Pt admitted with recurrent syncope, orthostatic hypotension, sepsis secondary to UTI, and a-fib with RVR.  PMH includes a-fib on Eliquis, systolic CHF, CAD, recurrent syncope secondary to orthostatic hypotension and BPPV, colon CA, dysphagia, HA, htn, R ankle fx sx.  Clinical Impression  Prior to hospital admission, pt was modified independent ambulating with SPC; lives alone in 2 story home (pt has been sleeping on couch on main level and using 1/2 bath on main floor recently).  Orthostatics taken during session: supine BP 110/73 with HR 109 bpm; sitting BP 128/84 with HR 109-131 bpm (pt mildly lightheaded); standing (at 0 minutes) BP 101/77 with HR around 121 bpm; and standing BP at 3 minutes 95/72 with HR around 124 bpm (pt mildly SOB).  Currently pt is independent with bed mobility; modified independent with transfers using RW; and CGA ambulating 20 feet with RW use.  Limited distance ambulating d/t pt's HR 116-145 bpm and pt c/o SOB and fatigue.  During session, in general, pt appearing fairly strong and also moves fairly well with RW use.  MD notified of pt's vitals, symptoms, and how pt did during session.  Pt would benefit from skilled PT to address noted impairments and functional limitations (see below for any additional details).  Upon hospital discharge, pt would benefit from [place] and support from family.    Recommendations for follow up therapy are one component of a multi-disciplinary discharge planning process, led by the attending physician.  Recommendations may be updated based on patient status, additional functional criteria and insurance authorization.  Follow Up Recommendations Home health PT    Assistance Recommended at Discharge  Frequent or constant Supervision/Assistance (d/t h/o syncopal episodes)  Patient can return home with the following  A little help with walking and/or transfers;A little help with bathing/dressing/bathroom;Assistance with cooking/housework;Assist for transportation;Help with stairs or ramp for entrance    Equipment Recommendations Other (comment) (pt has own RW at home already)  Recommendations for Other Services       Functional Status Assessment Patient has had a recent decline in their functional status and demonstrates the ability to make significant improvements in function in a reasonable and predictable amount of time.     Precautions / Restrictions Precautions Precautions: Fall Precaution Comments: Target HR 65-105 bpm Restrictions Weight Bearing Restrictions: Yes RLE Weight Bearing: Weight bearing as tolerated      Mobility  Bed Mobility Overal bed mobility: Independent             General bed mobility comments: Supine to/from sitting without any noted difficulties    Transfers Overall transfer level: Modified independent Equipment used: Rolling walker (2 wheels)               General transfer comment: no difficulties noted with transfers; steady and safe with RW use    Ambulation/Gait Ambulation/Gait assistance: Min guard Gait Distance (Feet): 20 Feet Assistive device: Rolling walker (2 wheels) Gait Pattern/deviations: Step-through pattern Gait velocity: decreased     General Gait Details: steady with RW use  Stairs            Wheelchair Mobility    Modified Rankin (Stroke Patients Only)       Balance Overall balance assessment: Needs assistance Sitting-balance support: No upper extremity supported, Feet supported Sitting  balance-Leahy Scale: Normal Sitting balance - Comments: steady sitting reaching outside BOS   Standing balance support: Bilateral upper extremity supported, During functional activity, Reliant on assistive device  for balance Standing balance-Leahy Scale: Good Standing balance comment: steady ambulating with RW use                             Pertinent Vitals/Pain Pain Assessment Pain Assessment: No/denies pain    Home Living Family/patient expects to be discharged to:: Private residence Living Arrangements: Alone Available Help at Discharge: Family;Available PRN/intermittently Type of Home: House Home Access: Stairs to enter   [employer] of Steps: 4 (B railings from back entrance; L railing from front entrance) Alternate Level Stairs-Number of Steps: flight of steps Home Layout: Two level;1/2 bath on main level;Bed/bath upstairs (can sleep on couch on main level) Home Equipment: Standard Walker;Cane - single point;Crutches;Rolling Walker (2 wheels) Additional Comments: H/o ankle fx and R LE fx in November 2022.    Prior Function Prior Level of Function : Needs assist;History of Falls (last six months)             Mobility Comments: Ambulatory with SPC; 3 falls (2 days ago) d/t recurrent syncope ADLs Comments: Pt has been doing sponge baths and sleeping on main floor recently.  Limited mobility last couple months d/t recurrent syncope.     Hand Dominance        Extremity/Trunk Assessment   Upper Extremity Assessment Upper Extremity Assessment: Generalized weakness    Lower Extremity Assessment Lower Extremity Assessment: Generalized weakness (decreased R LE AROM (d/t h/o ankle surgery in November 2022))    Cervical / Trunk Assessment Cervical / Trunk Assessment: Normal  Communication   Communication: No difficulties  Cognition Arousal/Alertness: Awake/alert Behavior During Therapy: WFL for tasks assessed/performed Overall Cognitive Status: Within Functional Limits for tasks assessed                                          General Comments  Nursing cleared pt for participation in physical therapy.  Pt agreeable to PT  session.    Exercises     Assessment/Plan    PT Assessment Patient needs continued PT services  PT Problem List Decreased strength;Decreased activity tolerance;Decreased balance;Decreased mobility;Cardiopulmonary status limiting activity       PT Treatment Interventions DME instruction;Gait training;Stair training;Functional mobility training;Therapeutic activities;Therapeutic exercise;Balance training;Patient/family education    PT Goals (Current goals can be found in the Care Plan section)  Acute Rehab PT Goals Patient Stated Goal: to improve symptoms PT Goal Formulation: With patient Time For Goal Achievement: 05/30/22 Potential to Achieve Goals: Fair    Frequency Min 2X/week     Co-evaluation               AM-PAC PT "6 Clicks" Mobility  Outcome Measure Help needed turning from your back to your side while in a flat bed without using bedrails?: None Help needed moving from lying on your back to sitting on the side of a flat bed without using bedrails?: None Help needed moving to and from a bed to a chair (including a wheelchair)?: A Little Help needed standing up from a chair using your arms (e.g., wheelchair or bedside chair)?: A Little Help needed to walk in hospital room?: A Little Help needed climbing 3-5 steps with a railing? : A Little  6 Click Score: 20    End of Session Equipment Utilized During Treatment: Gait belt Activity Tolerance: Other (comment) (limited d/t elevated HR with activity and symptomatic) Patient left: in bed;with call bell/phone within reach;with bed alarm set Nurse Communication: Mobility status;Precautions;Other (comment) (pt's vitals during session) PT Visit Diagnosis: Other abnormalities of gait and mobility (R26.89);Muscle weakness (generalized) (M62.81) (Repeated falls d/t syncope)    Time: 1583-0940 PT Time Calculation (min) (ACUTE ONLY): 35 min   Charges:   PT Evaluation $PT Eval Low Complexity: 1 Low PT  Treatments $Therapeutic Activity: 8-22 mins       Leitha Bleak, PT 05/16/22, 1:42 PM

## 2022-05-16 NOTE — Assessment & Plan Note (Signed)
--  obtain C diff and GI path

## 2022-05-16 NOTE — Assessment & Plan Note (Addendum)
Currently compensated --cont home coreg

## 2022-05-16 NOTE — Care Management Obs Status (Signed)
[place] NOTIFICATION   Patient Details  Name: Caroline Zamora MRN: [id number] Date of Birth: 04-Nov-1941   Medicare Observation Status Notification Given:  Yes    Shelbie Hutching, RN 05/16/2022, 12:12 PM

## 2022-05-16 NOTE — Assessment & Plan Note (Signed)
Not in RVR for extended periods of time.  HR goes up mostly with exertion. Plan: --cont home amiodarone and coreg --cont home Eliquis

## 2022-05-16 NOTE — Progress Notes (Signed)
  Progress Note   Patient: Caroline Zamora MMH:[id number] DOB: 1941/06/08 DOA: 05/15/2022     1 DOS: the patient was seen and examined on 05/16/2022   Brief hospital course: Caroline Zamora is a 81 y.o. 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  Assessment and Plan: * Recurrent syncope 2/2 Orthostatic hypotension Cardiology notes from May and June 2023 reviewed in which suspicion is that patient has orthostatic hypotension.  Episodes are mostly when patient tries to get up.  Has had complete cardiac workup.  Cardio had recommended compression compression stocking and abdominal binder which pt hasn't started using.   --Pt didn't mention it, but son and ED RN mentioned diarrhea, which may have exacerbated the orthostasis. Plan: --NS'@100'$  for 10 more hours --will defer to outpatient cardio for starting Florinef.   --Orthostatic precautions --Thigh-high teds and abdominal binder --No other cardiac work-up as patient recently had cardiac MRI, left heart cath, Zio patch  UTI (urinary tract infection) --UA large leuk and many bacteria, so pt was started on ceftriaxone on admission.  pt reported mild burning with urination when asked. --de-escalate to Macrobid --f/u urine cx  Diarrhea --obtain C diff and GI path  Weakness PT eval recommended home with HH, however, pt lives alone, and has diarrhea currently, so felt not safe to go home.    HFrEF (heart failure with reduced ejection fraction) ([place]) Currently compensated --cont home coreg   A-fib ([place]) Not in RVR for extended periods  of time.  HR goes up mostly with exertion. Plan: --cont home amiodarone and coreg --cont home Eliquis  Sepsis, ruled out      Subjective:  Pt reported minor burning with urination.  Able to walk with PT, however, felt weak, and HR went up with exertion.  Originally planned to discharge pt today, however, son doesn't feel pt is safe to go home currently given that pt lives alone.  ED RN also noted diarrhea, so discharge was canceled.   Physical Exam:  Constitutional: NAD, AAOx3 HEENT: conjunctivae and lids normal, EOMI CV: No cyanosis.   RESP: normal respiratory effort, on RA Extremities: No effusions, edema in BLE SKIN: warm, dry Neuro: II - XII grossly intact.   Psych: Normal mood and affect.     Data Reviewed:  Family Communication: son updated on the phone today  Disposition: Status is: Observation   Planned Discharge Destination: Home with Home Health    Time spent: 50 minutes  Author: Enzo Bi, MD 05/16/2022 6:36 PM  For on call review [email or web address].

## 2022-05-16 NOTE — ED Notes (Signed)
Pt c/o HA with request of medication.

## 2022-05-16 NOTE — Telephone Encounter (Signed)
Patient is in the hospital and they are discharging her on tomorrow. Son feels that too soon and would like to talk to the dr about it. Please advise

## 2022-05-16 NOTE — Assessment & Plan Note (Addendum)
Heart rate improved with IV fluid bolus and may have been a tachycardic response to hypotension Will hold off on diltiazem infusion started in the ED Continue amiodarone Continue carvedilol as BP will tolerate Continue Eliquis

## 2022-05-16 NOTE — TOC Progression Note (Signed)
Transition of Care [facility]) - Progression Note    Patient Details  Name: Caroline Zamora MRN: [id number] Date of Birth: Oct 10, 1941  Transition of Care [facility]) CM/SW Contact  Shelbie Hutching, RN Phone Number: 05/16/2022, 3:32 PM  Clinical Narrative:    Discharge has been cancelled.  Patient will go to a floor bed.  RNCM has given [facility] referral to Marveen Reeks for PT.     Expected Discharge Plan: Home/Self Care Barriers to Discharge: Barriers Resolved  Expected Discharge Plan and Services Expected Discharge Plan: Home/Self Care   Discharge Planning Services: CM Consult   Living arrangements for the past 2 months: Single Family Home Expected Discharge Date: 05/16/22               DME Arranged: N/A DME Agency: NA       HH Arranged: Patient Refused [place] Agency: NA         Social Determinants of Health (SDOH) Interventions    Readmission Risk Interventions     No data to display

## 2022-05-16 NOTE — Assessment & Plan Note (Signed)
--  UA large leuk and many bacteria, so pt was started on ceftriaxone on admission.  pt reported mild burning with urination when asked. --de-escalate to Macrobid --f/u urine cx

## 2022-05-16 NOTE — Assessment & Plan Note (Addendum)
Low suspicion for sepsis but criteria included tachycardia, hypotension, leukocytosis and UTI IV hydration Rocephin and follow cultures

## 2022-05-16 NOTE — Telephone Encounter (Signed)
Spoke with pt's son, DPR who states pt has been admitted and is being kept at least overnight so he feels better about pt's current status.  Advised when pt is discharged we will be referred back to her PCP for follow up as well as cardiology and pt can schedule appointments according to discharge recommendation.  Pt's son verbalizes understanding and thanked Therapist, sports for the call back.

## 2022-05-16 NOTE — Plan of Care (Signed)

## 2022-05-16 NOTE — ED Notes (Signed)
Pt unable to make it from bed to bedside commode quick enough even with my assistance. Pt had large watery diarrhea  in brief and floor. Pt asked this RN to throw pt pants away.

## 2022-05-16 NOTE — TOC Progression Note (Signed)
Transition of Care [facility]) - Progression Note    Patient Details  Name: AYAKA ANDES MRN: [id number] Date of Birth: [birth year]  Transition of Care [facility]) CM/SW Contact  Shelbie Hutching, RN Phone Number: 05/16/2022, 4:01 PM  Clinical Narrative:    Patient was accepted by [facility] for PT and OT- they can go out to see her Thursday.     Expected Discharge Plan: Home/Self Care Barriers to Discharge: Barriers Resolved  Expected Discharge Plan and Services Expected Discharge Plan: Home/Self Care   Discharge Planning Services: CM Consult   Living arrangements for the past 2 months: Single Family Home Expected Discharge Date: 05/16/22               DME Arranged: N/A DME Agency: NA       HH Arranged: Patient Refused [place] Agency: NA         Social Determinants of Health (SDOH) Interventions    Readmission Risk Interventions     No data to display

## 2022-05-16 NOTE — Care Management CC44 (Signed)
Condition Code 44 Documentation Completed  Patient Details  Name: Caroline Zamora MRN: [id number] Date of Birth: [birth year]   Condition Code 44 given:  Yes Patient signature on Condition Code 44 notice:  Yes Documentation of 2 MD's agreement:  Yes Code 44 added to claim:  Yes    Shelbie Hutching, RN 05/16/2022, 12:12 PM

## 2022-05-16 NOTE — Assessment & Plan Note (Addendum)
2/2 Orthostatic hypotension Cardiology notes from May and June 2023 reviewed in which suspicion is that patient has orthostatic hypotension.  Episodes are mostly when patient tries to get up.  Has had complete cardiac workup.  Cardio had recommended compression compression stocking and abdominal binder which pt hasn't started using.   --Pt didn't mention it, but son and ED RN mentioned diarrhea, which may have exacerbated the orthostasis. Plan: --NS'@100'$  for 10 more hours --will defer to outpatient cardio for starting Florinef.   --Orthostatic precautions --Thigh-high teds and abdominal binder --No other cardiac work-up as patient recently had cardiac MRI, left heart cath, Zio patch

## 2022-05-16 NOTE — TOC Initial Note (Signed)
Transition of Care [facility]) - Initial/Assessment Note    Patient Details  Name: Caroline Zamora MRN: [id number] Date of Birth: 1941-06-25  Transition of Care [facility]) CM/SW Contact:    Shelbie Hutching, RN Phone Number: 05/16/2022, 12:03 PM  Clinical Narrative:                 Patient came into the emergency room with syncopal episodes and orthostatic BP's.  Patient has been medically cleared for discharge home.  She worked with PT and believes that it went really well.  She does not want me to set up [facility] services.  She has been to outpatient PT in the past and she would prefer to do that- she will call her PCP to send a referral for outpatient.  Patient has 2 walkers and a cane at home.  Her son will be providing transportation for her until her symptoms completely resolve.  Son will pick her up this afternoon.   Expected Discharge Plan: Home/Self Care Barriers to Discharge: Barriers Resolved   Patient Goals and CMS Choice Patient states their goals for this hospitalization and ongoing recovery are:: Patient wants to go home      Expected Discharge Plan and Services Expected Discharge Plan: Home/Self Care   Discharge Planning Services: CM Consult   Living arrangements for the past 2 months: Single Family Home Expected Discharge Date: 05/16/22               DME Arranged: N/A DME Agency: NA       HH Arranged: Patient Refused [place] Agency: NA        Prior Living Arrangements/Services Living arrangements for the past 2 months: Single Family Home Lives with:: Self Patient language and need for interpreter reviewed:: Yes Do you feel safe going back to the place where you live?: Yes      Need for Family Participation in Patient Care: Yes (Comment) Care giver support system in place?: Yes (comment) (son) Current home services: DME (2 walkers and a cane) Criminal Activity/Legal Involvement Pertinent to Current Situation/Hospitalization: No - Comment as needed  Activities of Daily  Living      Permission Sought/Granted Permission sought to share information with : Case Manager, Family Supports Permission granted to share information with : Yes, Verbal Permission Granted  Share Information with NAME: Isbella Arline     Permission granted to share info w Relationship: son  Permission granted to share info w Contact Information: [phone]  Emotional Assessment Appearance:: Appears stated age Attitude/Demeanor/Rapport: Engaged Affect (typically observed): Accepting Orientation: : Oriented to Self, Oriented to Place, Oriented to  Time, Oriented to Situation Alcohol / Substance Use: Not Applicable Psych Involvement: No (comment)  Admission diagnosis:  Rapid atrial fibrillation [facility]) [I48.91] Patient Active Problem List   Diagnosis Date Noted   Sepsis secondary to UTI ([place]) 05/15/2022   Rapid atrial fibrillation ([place]) 05/15/2022   Recurrent syncope 04/15/2022   Syncope and collapse 04/14/2022   Depression 04/14/2022   HFrEF (heart failure with reduced ejection fraction) ([place]) 04/14/2022   Atrial fibrillation with rapid ventricular response ([place]) 04/13/2022   A-fib ([place]) 07/04/2019   PCP:  Kirk Ruths, MD Pharmacy:   [id number] [id number]: [phone]: [phone]    Social Determinants of Health (SDOH) Interventions    Readmission Risk Interventions     No data to display

## 2022-05-16 NOTE — Assessment & Plan Note (Addendum)
PT eval recommended home with HH, however, pt lives alone, and has diarrhea currently, so felt not safe to go home.

## 2022-05-16 NOTE — H&P (Signed)
History and Physical    Patient: Caroline Zamora DOB: Oct 14, 1941 DOA: 05/15/2022 DOS: the patient was seen and examined on 05/16/2022 PCP: Kirk Ruths, MD  Patient coming from: Home  Chief Complaint:  Chief Complaint  Patient presents with   Loss of Consciousness    HPI: Caroline Zamora is a 81 y.o. 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 ED course and data review: On arrival hypotensive at 80/62 and in rapid A-fib with rate up to the 130s Labs with troponin of 10.  WBC 12,600 lactic acid 1.1.  Creatinine 1.16, up from baseline of 0.97.  Urinalysis consistent with UTI. EKG, personally viewed and interpreted: Read as wide QRS complex at 135 with RBBB Imaging: CT head and maxillofacial and C-spine all nonacute Patient was bolused with NS with improvement in his BP to the low 100s A diltiazem infusion was ordered but not started due to soft blood pressures.  Heart rate nonetheless came down to the low 100s on its own.  She was started on Rocephin and sepsis protocol was started.  Hospitalist consulted for admission.     Past Medical History:  Diagnosis Date   Arthritis    lumbar spine   CKD (chronic kidney disease), stage II    Colon cancer ([place])    Depression    Dysphagia    Headache    migraine   Hx of adenomatous colonic polyps    Hyperlipidemia    Hypertension    PAF (paroxysmal atrial fibrillation) (HCC)    Paroxysmal atrial fibrillation ([place])    Skin cancer    Syncope    Past Surgical History:  Procedure Laterality Date   ABDOMINAL  HYSTERECTOMY     with removal of tubes and ovaries   ANKLE FRACTURE SURGERY Right    COLONOSCOPY     COLONOSCOPY WITH PROPOFOL N/A 09/03/2017   Procedure: COLONOSCOPY WITH PROPOFOL;  Surgeon: Manya Silvas, MD;  Location: [facility] ENDOSCOPY;  Service: Endoscopy;  Laterality: N/A;   COLONOSCOPY WITH PROPOFOL N/A 09/13/2020   Procedure: COLONOSCOPY WITH PROPOFOL;  Surgeon: Lesly Rubenstein, MD;  Location: [facility] ENDOSCOPY;  Service: Endoscopy;  Laterality: N/A;   DILATION AND CURETTAGE OF UTERUS     ESOPHAGOGASTRODUODENOSCOPY     ESOPHAGOGASTRODUODENOSCOPY (EGD) WITH PROPOFOL N/A 09/13/2020   Procedure: ESOPHAGOGASTRODUODENOSCOPY (EGD) WITH PROPOFOL;  Surgeon: Lesly Rubenstein, MD;  Location: [facility] ENDOSCOPY;  Service: Endoscopy;  Laterality: N/A;   LEFT HEART CATH AND CORONARY ANGIOGRAPHY N/A 04/17/2022   Procedure: LEFT HEART CATH AND CORONARY ANGIOGRAPHY;  Surgeon: Lorretta Harp, MD;  Location: [facility];  Service: Cardiovascular;  Laterality: N/A;   skin cancer removal     TONSILLECTOMY     TUBAL LIGATION     Social History:  reports that she has never smoked. She has never used smokeless tobacco. She reports that she does not currently use alcohol. She reports that she does not use drugs.  Allergies  Allergen Reactions   Desyrel [Trazodone] Rash   Sulfa Antibiotics Rash    Family History  Problem Relation Age of Onset   Diabetes  Mellitus II Mother    Atrial fibrillation Father    Breast cancer Neg Hx     Prior to Admission medications   Medication Sig Start Date End Date Taking? Authorizing Provider  acetaminophen (TYLENOL) 500 MG tablet Take 500 mg by mouth every 6 (six) hours as needed for mild pain.   Yes [provider]  amiodarone (PACERONE) 200 MG tablet Take 1 tablet (200 mg) by mouth once daily 05/10/22  Yes Deboraha Sprang, MD  apixaban (ELIQUIS) 5 MG TABS tablet Take 5 mg by mouth 2 (two) times daily.   Yes [provider]   calcium-vitamin D (OSCAL WITH D) 500-5 MG-MCG tablet Take 1 tablet by mouth daily with breakfast.   Yes [provider]  carvedilol (COREG) 3.125 MG tablet Take 1 tablet (3.125 mg total) by mouth 2 (two) times daily with a meal. 05/04/22  Yes Deboraha Sprang, MD  estradiol (ESTRACE) 0.5 MG tablet Take 0.5 mg by mouth at bedtime.   Yes [provider]  hydrOXYzine (ATARAX) 25 MG tablet Take 25 mg by mouth 2 (two) times daily as needed for anxiety. 01/11/22  Yes [provider]  venlafaxine XR (EFFEXOR-XR) 150 MG 24 hr capsule Take 150 mg by mouth daily. 01/20/22  Yes [provider]  diltiazem (CARTIA XT) 240 MG 24 hr capsule Take 240 mg by mouth daily. Patient not taking: Reported on 05/15/2022    [provider]  mometasone (NASONEX) 50 MCG/ACT nasal spray Place 2 sprays into the nose daily as needed (allergies). Patient not taking: Reported on 05/15/2022    [provider]    Physical Exam: Vitals:   05/15/22 2130 05/15/22 2200 05/15/22 2230 05/15/22 2300  BP: 108/73 96/76 106/62 (!) 99/52  Pulse:      Resp: 20   (!) 24  Temp:      TempSrc:      SpO2:      Weight:      Height:       Physical Exam Vitals and nursing note reviewed.  Constitutional:      General: She is not in acute distress.    Comments: Patient with multiple cuts and bruises from prior falls  HENT:     Head: Normocephalic and atraumatic.  Cardiovascular:     Rate and Rhythm: Normal rate and regular rhythm.     Heart sounds: Normal heart sounds.  Pulmonary:     Effort: Pulmonary effort is normal.     Breath sounds: Normal breath sounds.  Abdominal:     Palpations: Abdomen is soft.     Tenderness: There is no abdominal tenderness.  Neurological:     Mental Status: Mental status is at baseline.     Labs on Admission: I have personally reviewed following labs and imaging studies  CBC: Recent Labs  Lab 05/15/22 1759  WBC 12.6*  HGB 12.4  HCT 38.2  MCV  83.4  PLT 222   Basic Metabolic Panel: Recent Labs  Lab 05/15/22 1759  NA 132*  K 3.5  CL 99  CO2 24  GLUCOSE 128*  BUN 24*  CREATININE 1.16*  CALCIUM 7.7*   GFR: Estimated Creatinine Clearance: 41.2 mL/min (A) (by C-G formula based on SCr of 1.16 mg/dL (H)). Liver Function Tests: No results for input(s): "AST", "ALT", "ALKPHOS", "BILITOT", "PROT", "ALBUMIN" in the last 168 hours. No results for input(s): "LIPASE", "AMYLASE" in the last 168 hours. No results for input(s): "AMMONIA" in the last 168 hours. Coagulation  Profile: No results for input(s): "INR", "PROTIME" in the last 168 hours. Cardiac Enzymes: No results for input(s): "CKTOTAL", "CKMB", "CKMBINDEX", "TROPONINI" in the last 168 hours. BNP (last 3 results) No results for input(s): "PROBNP" in the last 8760 hours. HbA1C: No results for input(s): "HGBA1C" in the last 72 hours. CBG: No results for input(s): "GLUCAP" in the last 168 hours. Lipid Profile: No results for input(s): "CHOL", "HDL", "LDLCALC", "TRIG", "CHOLHDL", "LDLDIRECT" in the last 72 hours. Thyroid Function Tests: No results for input(s): "TSH", "T4TOTAL", "FREET4", "T3FREE", "THYROIDAB" in the last 72 hours. Anemia Panel: No results for input(s): "VITAMINB12", "FOLATE", "FERRITIN", "TIBC", "IRON", "RETICCTPCT" in the last 72 hours. Urine analysis:    Component Value Date/Time   COLORURINE YELLOW (A) 05/15/2022 2014   APPEARANCEUR CLOUDY (A) 05/15/2022 2014   LABSPEC 1.017 05/15/2022 2014   PHURINE 5.0 05/15/2022 2014   GLUCOSEU NEGATIVE 05/15/2022 2014   HGBUR NEGATIVE 05/15/2022 2014   [place] NEGATIVE 05/15/2022 2014   [place] NEGATIVE 05/15/2022 2014   PROTEINUR 100 (A) 05/15/2022 2014   NITRITE NEGATIVE 05/15/2022 2014   LEUKOCYTESUR LARGE (A) 05/15/2022 2014    Radiological Exams on Admission: CT HEAD WO CONTRAST (5MM)  Result Date: 05/15/2022 CLINICAL DATA:  Trauma. EXAM: CT HEAD WITHOUT CONTRAST CT MAXILLOFACIAL WITHOUT  CONTRAST CT CERVICAL SPINE WITHOUT CONTRAST TECHNIQUE: Multidetector CT imaging of the head, cervical spine, and maxillofacial structures were performed using the standard protocol without intravenous contrast. Multiplanar CT image reconstructions of the cervical spine and maxillofacial structures were also generated. RADIATION DOSE REDUCTION: This exam was performed according to the departmental dose-optimization program which includes automated exposure control, adjustment of the mA and/or kV according to patient size and/or use of iterative reconstruction technique. COMPARISON:  Apr 13, 2022. FINDINGS: CT HEAD FINDINGS Brain: No evidence of acute infarction, hemorrhage, hydrocephalus, extra-axial collection or mass lesion/mass effect. Vascular: No hyperdense vessel identified. Skull: No acute fracture. Other: No mastoid effusions. CT MAXILLOFACIAL FINDINGS Osseous: No fracture or mandibular dislocation. No destructive process. Orbits: Negative. No traumatic or inflammatory finding. Sinuses: Clear. Soft tissues: Negative. CT CERVICAL SPINE FINDINGS Alignment: Slight anterolisthesis of C3 on C4, favor degenerative given facet arthropathy this level. Skull base and vertebrae: No evidence of acute fracture. Vertebral body heights are maintained. Soft tissues and spinal canal: No prevertebral fluid or swelling. No visible canal hematoma. Disc levels: Moderate multilevel degenerative change including degenerative disc disease and facet/uncovertebral hypertrophy. Upper chest: Biapical pleuroparenchymal scarring. Otherwise, visualized lung apices are clear. IMPRESSION: 1. No evidence of acute intracranial abnormality or facial fracture. 2. No evidence of acute fracture or traumatic malalignment the cervical spine. Electronically Signed   By: Margaretha Sheffield M.D.   On: 05/15/2022 19:26   CT Maxillofacial Wo Contrast  Result Date: 05/15/2022 CLINICAL DATA:  Trauma. EXAM: CT HEAD WITHOUT CONTRAST CT MAXILLOFACIAL  WITHOUT CONTRAST CT CERVICAL SPINE WITHOUT CONTRAST TECHNIQUE: Multidetector CT imaging of the head, cervical spine, and maxillofacial structures were performed using the standard protocol without intravenous contrast. Multiplanar CT image reconstructions of the cervical spine and maxillofacial structures were also generated. RADIATION DOSE REDUCTION: This exam was performed according to the departmental dose-optimization program which includes automated exposure control, adjustment of the mA and/or kV according to patient size and/or use of iterative reconstruction technique. COMPARISON:  Apr 13, 2022. FINDINGS: CT HEAD FINDINGS Brain: No evidence of acute infarction, hemorrhage, hydrocephalus, extra-axial collection or mass lesion/mass effect. Vascular: No hyperdense vessel identified. Skull: No acute fracture. Other: No mastoid effusions. CT MAXILLOFACIAL FINDINGS  Osseous: No fracture or mandibular dislocation. No destructive process. Orbits: Negative. No traumatic or inflammatory finding. Sinuses: Clear. Soft tissues: Negative. CT CERVICAL SPINE FINDINGS Alignment: Slight anterolisthesis of C3 on C4, favor degenerative given facet arthropathy this level. Skull base and vertebrae: No evidence of acute fracture. Vertebral body heights are maintained. Soft tissues and spinal canal: No prevertebral fluid or swelling. No visible canal hematoma. Disc levels: Moderate multilevel degenerative change including degenerative disc disease and facet/uncovertebral hypertrophy. Upper chest: Biapical pleuroparenchymal scarring. Otherwise, visualized lung apices are clear. IMPRESSION: 1. No evidence of acute intracranial abnormality or facial fracture. 2. No evidence of acute fracture or traumatic malalignment the cervical spine. Electronically Signed   By: Margaretha Sheffield M.D.   On: 05/15/2022 19:26   CT Cervical Spine Wo Contrast  Result Date: 05/15/2022 CLINICAL DATA:  Trauma. EXAM: CT HEAD WITHOUT CONTRAST CT  MAXILLOFACIAL WITHOUT CONTRAST CT CERVICAL SPINE WITHOUT CONTRAST TECHNIQUE: Multidetector CT imaging of the head, cervical spine, and maxillofacial structures were performed using the standard protocol without intravenous contrast. Multiplanar CT image reconstructions of the cervical spine and maxillofacial structures were also generated. RADIATION DOSE REDUCTION: This exam was performed according to the departmental dose-optimization program which includes automated exposure control, adjustment of the mA and/or kV according to patient size and/or use of iterative reconstruction technique. COMPARISON:  Apr 13, 2022. FINDINGS: CT HEAD FINDINGS Brain: No evidence of acute infarction, hemorrhage, hydrocephalus, extra-axial collection or mass lesion/mass effect. Vascular: No hyperdense vessel identified. Skull: No acute fracture. Other: No mastoid effusions. CT MAXILLOFACIAL FINDINGS Osseous: No fracture or mandibular dislocation. No destructive process. Orbits: Negative. No traumatic or inflammatory finding. Sinuses: Clear. Soft tissues: Negative. CT CERVICAL SPINE FINDINGS Alignment: Slight anterolisthesis of C3 on C4, favor degenerative given facet arthropathy this level. Skull base and vertebrae: No evidence of acute fracture. Vertebral body heights are maintained. Soft tissues and spinal canal: No prevertebral fluid or swelling. No visible canal hematoma. Disc levels: Moderate multilevel degenerative change including degenerative disc disease and facet/uncovertebral hypertrophy. Upper chest: Biapical pleuroparenchymal scarring. Otherwise, visualized lung apices are clear. IMPRESSION: 1. No evidence of acute intracranial abnormality or facial fracture. 2. No evidence of acute fracture or traumatic malalignment the cervical spine. Electronically Signed   By: Margaretha Sheffield M.D.   On: 05/15/2022 19:26     Data Reviewed: Relevant notes from primary care and specialist visits, past discharge summaries as  available in EHR, including Care Everywhere. Prior diagnostic testing as pertinent to current admission diagnoses Updated medications and problem lists for reconciliation ED course, including vitals, labs, imaging, treatment and response to treatment Triage notes, nursing and pharmacy notes and ED provider's notes Notable results as noted in HPI   Assessment and Plan: * Recurrent syncope Orthostatic hypotension Cardiology notes from May and June 2023 reviewed in which suspicion is that patient has orthostatic hypotension.  Episodes are mostly when patient tries to get up. Consider starting Florinef.  Allow supine hypertension Orthostatic precautions Thigh-high teds if available IV hydration Cardiac monitor.  No other work-up as patient recently had cardiac MRI, left heart cath, Zio patch  Sepsis secondary to UTI [facility]) Low suspicion for sepsis but criteria included tachycardia, hypotension, leukocytosis and UTI IV hydration Rocephin and follow cultures  Atrial fibrillation with rapid ventricular response (HCC) Heart rate improved with IV fluid bolus and may have been a tachycardic response to hypotension Will hold off on diltiazem infusion started in the ED Continue amiodarone Continue carvedilol as BP will tolerate Continue Eliquis  HFrEF (heart failure with reduced ejection fraction) (HCC) Currently compensated Hold off on BP lowering meds due to soft blood pressures Monitor for fluid overload in view of IV fluids being administered for hypotension and possibility of sepsis Daily weights        DVT prophylaxis: Apixaban  Consults: none  Advance Care Planning:   Code Status: Full Code   Family Communication: none  Disposition Plan: Back to previous home environment  Severity of Illness: The appropriate patient status for this patient is INPATIENT. Inpatient status is judged to be reasonable and necessary in order to provide the required intensity of service to  ensure the patient's safety. The patient's presenting symptoms, physical exam findings, and initial radiographic and laboratory data in the context of their chronic comorbidities is felt to place them at high risk for further clinical deterioration. Furthermore, it is not anticipated that the patient will be medically stable for discharge from the hospital within 2 midnights of admission.   * I certify that at the point of admission it is my clinical judgment that the patient will require inpatient hospital care spanning beyond 2 midnights from the point of admission due to high intensity of service, high risk for further deterioration and high frequency of surveillance required.*  Author: Athena Masse, MD 05/16/2022 12:01 AM  For on call review [email or web address].

## 2022-05-17 DIAGNOSIS — R55 Syncope and collapse: Secondary | ICD-10-CM | POA: Diagnosis not present

## 2022-05-17 LAB — BASIC METABOLIC PANEL
Anion gap: 5 (ref 5–15)
BUN: 15 mg/dL (ref 8–23)
CO2: 22 mmol/L (ref 22–32)
Calcium: 7.4 mg/dL — ABNORMAL LOW (ref 8.9–10.3)
Chloride: 108 mmol/L (ref 98–111)
Creatinine, Ser: 0.78 mg/dL (ref 0.44–1.00)
GFR, Estimated: >60 mL/min (ref >60)
Glucose, Bld: 94 mg/dL (ref 70–99)
Potassium: 3.8 mmol/L (ref 3.5–5.1)
Sodium: 135 mmol/L (ref 135–145)

## 2022-05-17 LAB — MAGNESIUM: Magnesium: 2 mg/dL (ref 1.7–2.4)

## 2022-05-17 LAB — CBC
HCT: 29.4 % — ABNORMAL LOW (ref 36.0–46.0)
Hemoglobin: 9.7 g/dL — ABNORMAL LOW (ref 12.0–15.0)
MCH: 27.2 pg (ref 26.0–34.0)
MCHC: 33 g/dL (ref 30.0–36.0)
MCV: 82.4 fL (ref 80.0–100.0)
Platelets: 260 10*3/uL (ref 150–400)
RBC: 3.57 MIL/uL — ABNORMAL LOW (ref 3.87–5.11)
RDW: 14.8 % (ref 11.5–15.5)
WBC: 7.5 10*3/uL (ref 4.0–10.5)
nRBC: 0 % (ref 0.0–0.2)

## 2022-05-17 MED ORDER — NITROFURANTOIN MONOHYD MACRO 100 MG PO CAPS: 100.0000 mg | ORAL_CAPSULE | Freq: Two times a day (BID) | ORAL | 0 refills | Status: AC

## 2022-05-17 NOTE — Progress Notes (Signed)
Physical Therapy Treatment Patient Details Name: Caroline Zamora MRN: [id number] DOB: 1941/03/16 Today's Date: 05/17/2022   History of Present Illness Pt is a 81 y.o. female presenting to hospital 05/15/22 d/t concerns of recurrent syncopal episodes.  Pt admitted with recurrent syncope, orthostatic hypotension, sepsis secondary to UTI, and a-fib with RVR.  PMH includes a-fib on Eliquis, systolic CHF, CAD, recurrent syncope secondary to orthostatic hypotension and BPPV, colon CA, dysphagia, HA, htn, R ankle fx sx.    PT Comments    Pt received in bed, OT had assisted with thigh high TED application which pt states has helped with symptoms. Pt educated on POC, discussed d/c needs, and safe transition home. Pt able to walk steadily with support of RW however continues to favor R LE (pt has f/u with Ortho in 2 weeks).  Education provided on proper stair negotiation to decrease pressure through R LE with good demonstration and safety awareness using single rail. Pt appears functionally safe to return home with frequent supervision and HHPT services once medically cleared.    Recommendations for follow up therapy are one component of a multi-disciplinary discharge planning process, led by the attending physician.  Recommendations may be updated based on patient status, additional functional criteria and insurance authorization.  Follow Up Recommendations  Home health PT     Assistance Recommended at Discharge Frequent or constant Supervision/Assistance  Patient can return home with the following A little help with walking and/or transfers;A little help with bathing/dressing/bathroom;Assistance with cooking/housework;Assist for transportation;Help with stairs or ramp for entrance   Equipment Recommendations  Other (comment) (Pt has RW at home)    Recommendations for Other Services       Precautions / Restrictions Precautions Precautions: Fall Precaution Comments: Target HR 65-105  bpm Restrictions Weight Bearing Restrictions: Yes RLE Weight Bearing: Weight bearing as tolerated     Mobility  Bed Mobility Overal bed mobility: Independent             General bed mobility comments: Supine to/from sitting without any noted difficulties    Transfers Overall transfer level: Modified independent Equipment used: Rolling walker (2 wheels)               General transfer comment: no difficulties noted with transfers; steady and safe with RW use    Ambulation/Gait Ambulation/Gait assistance: Supervision Gait Distance (Feet): 40 Feet Assistive device: Rolling walker (2 wheels) Gait Pattern/deviations: Step-through pattern Gait velocity: decreased     General Gait Details: steady with RW use   Stairs Stairs: Yes Stairs assistance: Supervision Stair Management: One rail Left, Step to pattern Number of Stairs: 5 General stair comments: Pt educated on proper sequencing for stair negotiation due to hx of R ankle surgery with residual pain.  Pt has Ortho f/u in 2 weeks   Wheelchair Mobility    Modified Rankin (Stroke Patients Only)       Balance Overall balance assessment: Needs assistance Sitting-balance support: No upper extremity supported, Feet supported Sitting balance-Leahy Scale: Normal Sitting balance - Comments: steady sitting reaching outside BOS   Standing balance support: Bilateral upper extremity supported, During functional activity, Reliant on assistive device for balance Standing balance-Leahy Scale: Good Standing balance comment: steady ambulating with RW use                            Cognition Arousal/Alertness: Awake/alert Behavior During Therapy: WFL for tasks assessed/performed Overall Cognitive Status: Within Functional Limits for tasks assessed  Exercises      General Comments General comments (skin integrity, edema, etc.): R Lower leg and foot  edema noted      Pertinent Vitals/Pain Pain Assessment Pain Assessment: 0-10 Pain Score: 2  Pain Location: Right ankle Pain Descriptors / Indicators: Aching, Guarding Pain Intervention(s): Monitored during session (Pt utilized RW to offset weight through R LE)    Home Living                          Prior Function            PT Goals (current goals can now be found in the care plan section) Acute Rehab PT Goals Patient Stated Goal: return home Progress towards PT goals: Progressing toward goals    Frequency    Min 2X/week      PT Plan Current plan remains appropriate    Co-evaluation              AM-PAC PT "6 Clicks" Mobility   Outcome Measure  Help needed turning from your back to your side while in a flat bed without using bedrails?: None Help needed moving from lying on your back to sitting on the side of a flat bed without using bedrails?: None Help needed moving to and from a bed to a chair (including a wheelchair)?: A Little Help needed standing up from a chair using your arms (e.g., wheelchair or bedside chair)?: A Little Help needed to walk in hospital room?: A Little Help needed climbing 3-5 steps with a railing? : A Little 6 Click Score: 20    End of Session Equipment Utilized During Treatment: Gait belt Activity Tolerance: Patient tolerated treatment well Patient left: in bed;with call bell/phone within reach;with bed alarm set Nurse Communication: Mobility status PT Visit Diagnosis: Other abnormalities of gait and mobility (R26.89);Muscle weakness (generalized) (M62.81)     Time: 3154-0086 PT Time Calculation (min) (ACUTE ONLY): 27 min  Charges:  $Gait Training: 8-22 mins $Therapeutic Activity: 8-22 mins                    Mikel Cella, PTA    Josie Dixon 05/17/2022, 12:55 PM

## 2022-05-17 NOTE — Discharge Summary (Signed)
Physician Discharge Summary  CHRISTOL THETFORD VPX:[id number] DOB: [birth year] DOA: 05/15/2022  PCP: Kirk Ruths, MD  Admit date: 05/15/2022 Discharge date: 05/17/2022  Admitted From: Home Disposition: Home with home health  Recommendations for Outpatient Follow-up:  Follow up with PCP in 1-2 weeks Follow-up with cardiology as directed  Home Health: Yes PT OT RN Equipment/Devices: Abdominal binder, thigh-high TED hose  Discharge Condition: Stable CODE STATUS: Full Diet recommendation: Regular  Brief/Interim Summary: 81 y.o. 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  Patient has had recurrent episodes of syncope.  Felt to be due to orthostasis.  Unable to exclude arrhythmia genic etiology at this time.  At time of discharge will recommend abdominal binder and thigh-high TED hose.  Patient will follow-up with Dr. Caryl Comes from EP as well as establish cardiology Dr. Saralyn Pilar.  We will continue 3-day course of treatment for presumptive urinary tract infection.  Diarrhea resolved spontaneously.  No indication for GI infectious work-up.  Home health ordered including PT, OT, RN.  Stable for discharge home.   Discharge Diagnoses:  Principal Problem:   Recurrent syncope Active Problems:   A-fib (HCC)   HFrEF (heart failure with reduced ejection fraction) ([place])   Weakness   Diarrhea   UTI (urinary tract infection)  Recurrent syncope 2/2 Orthostatic hypotension Cardiology notes from May and June 2023 reviewed in which suspicion is that patient has orthostatic hypotension.   Episodes are mostly when patient tries to get up.  Has had complete cardiac workup.  Cardio had recommended compression compression stocking and abdominal binder which pt hasn't started using.  These were placed at time of discharge.  Patient will follow-up with Dr. Caryl Comes from EP as well as establish cardiology.  Home health services ordered.  No other inpatient cardiac work-up indicated.  Patient had recent extensive cardiac work-up including MRI, left heart cath, Zio patch.    UTI (urinary tract infection) --UA large leuk and many bacteria, so pt was started on ceftriaxone on admission.  pt reported mild burning with urination when asked. --de-escalate to Macrobid -- Complete 3-day course   Diarrhea, resolved    Weakness Home with home health PT OT, RN   HFrEF (heart failure with reduced ejection fraction) ([place]) Currently compensated --cont home coreg     A-fib ([place]) Not in RVR for extended periods of time.  HR goes up mostly with exertion. Plan: --cont home amiodarone and coreg --cont home Eliquis   Sepsis, ruled out  Discharge Instructions  Discharge Instructions     Amb referral to [facility]   Complete by: As directed    Diet - low sodium heart healthy   Complete by: As directed    Discharge instructions   Complete by: As directed    You have had extensive cardiac workup for your syncope already.  Syncope is likely due to orthostasis (drop in blood pressure when you get up).  Please use compression stocking and abdominal binder to improve blood return to your head when you stand, also get up slowly and give yourself time to adjust, and use a walker to prevent further falls.  Your  urinary track infection is mild, so you are discharged on oral Macrobid for 4 more days, starting tonight.  You are mildly low on potassium.  I have prescribed you potassium supplement for 7 days.  Please follow up with PCP to check level and see if you need to continue supplement.   Dr.  Enzo Bi - -   Increase activity slowly   Complete by: As directed       Allergies as of 05/17/2022       Reactions   Desyrel [trazodone] Rash   Sulfa Antibiotics Rash        Medication List     STOP taking these medications    Cartia XT 240 MG 24 hr capsule Generic drug: diltiazem   mometasone 50 MCG/ACT nasal spray Commonly known as: NASONEX       TAKE these medications    acetaminophen 500 MG tablet Commonly known as: TYLENOL Take 500 mg by mouth every 6 (six) hours as needed for mild pain.   amiodarone 200 MG tablet Commonly known as: PACERONE Take 1 tablet (200 mg) by mouth once daily   apixaban 5 MG Tabs tablet Commonly known as: ELIQUIS Take 5 mg by mouth 2 (two) times daily.   calcium-vitamin D 500-5 MG-MCG tablet Commonly known as: OSCAL WITH D Take 1 tablet by mouth daily with breakfast.   carvedilol 3.125 MG tablet Commonly known as: COREG Take 1 tablet (3.125 mg total) by mouth 2 (two) times daily with a meal.   estradiol 0.5 MG tablet Commonly known as: ESTRACE Take 0.5 mg by mouth at bedtime.   hydrOXYzine 25 MG tablet Commonly known as: ATARAX Take 25 mg by mouth 2 (two) times daily as needed for anxiety.   nitrofurantoin (macrocrystal-monohydrate) 100 MG capsule Commonly known as: MACROBID Take 1 capsule (100 mg total) by mouth every 12 (twelve) hours for 2 days.   potassium chloride 20 MEQ packet Commonly known as: KLOR-CON Take 40 mEq by mouth daily for 7 days.   venlafaxine XR 150 MG 24 hr capsule Commonly known as: EFFEXOR-XR Take 150 mg by mouth daily.        Follow-up Information     Kirk Ruths, MD Follow up in 1 week(s).   Specialty: Internal Medicine Contact information: [place] [address] [place] Follow up.   Why: They will follow up with you for your home health therapy needs.               Allergies   Allergen Reactions   Desyrel [Trazodone] Rash   Sulfa Antibiotics Rash    Consultations: None   Procedures/Studies: CT HEAD WO CONTRAST (5MM)  Result Date: 05/15/2022 CLINICAL DATA:  Trauma. EXAM: CT HEAD WITHOUT CONTRAST CT MAXILLOFACIAL WITHOUT CONTRAST CT CERVICAL SPINE WITHOUT CONTRAST TECHNIQUE: Multidetector CT imaging of the head, cervical spine, and maxillofacial structures were performed using the standard protocol without intravenous contrast. Multiplanar CT image reconstructions of the cervical spine and maxillofacial structures were also generated. RADIATION DOSE REDUCTION: This exam was performed according to the departmental dose-optimization program which includes automated exposure control, adjustment of the mA and/or kV according to patient size and/or use of iterative reconstruction technique. COMPARISON:  Apr 13, 2022. FINDINGS: CT HEAD FINDINGS Brain: No evidence of acute infarction, hemorrhage, hydrocephalus, extra-axial collection or mass lesion/mass effect. Vascular: No hyperdense vessel identified. Skull: No acute fracture. Other: No  mastoid effusions. CT MAXILLOFACIAL FINDINGS Osseous: No fracture or mandibular dislocation. No destructive process. Orbits: Negative. No traumatic or inflammatory finding. Sinuses: Clear. Soft tissues: Negative. CT CERVICAL SPINE FINDINGS Alignment: Slight anterolisthesis of C3 on C4, favor degenerative given facet arthropathy this level. Skull base and vertebrae: No evidence of acute fracture. Vertebral body heights are maintained. Soft tissues and spinal canal: No prevertebral fluid or swelling. No visible canal hematoma. Disc levels: Moderate multilevel degenerative change including degenerative disc disease and facet/uncovertebral hypertrophy. Upper chest: Biapical pleuroparenchymal scarring. Otherwise, visualized lung apices are clear. IMPRESSION: 1. No evidence of acute intracranial abnormality or facial fracture. 2. No evidence of acute  fracture or traumatic malalignment the cervical spine. Electronically Signed   By: Margaretha Sheffield M.D.   On: 05/15/2022 19:26   CT Maxillofacial Wo Contrast  Result Date: 05/15/2022 CLINICAL DATA:  Trauma. EXAM: CT HEAD WITHOUT CONTRAST CT MAXILLOFACIAL WITHOUT CONTRAST CT CERVICAL SPINE WITHOUT CONTRAST TECHNIQUE: Multidetector CT imaging of the head, cervical spine, and maxillofacial structures were performed using the standard protocol without intravenous contrast. Multiplanar CT image reconstructions of the cervical spine and maxillofacial structures were also generated. RADIATION DOSE REDUCTION: This exam was performed according to the departmental dose-optimization program which includes automated exposure control, adjustment of the mA and/or kV according to patient size and/or use of iterative reconstruction technique. COMPARISON:  Apr 13, 2022. FINDINGS: CT HEAD FINDINGS Brain: No evidence of acute infarction, hemorrhage, hydrocephalus, extra-axial collection or mass lesion/mass effect. Vascular: No hyperdense vessel identified. Skull: No acute fracture. Other: No mastoid effusions. CT MAXILLOFACIAL FINDINGS Osseous: No fracture or mandibular dislocation. No destructive process. Orbits: Negative. No traumatic or inflammatory finding. Sinuses: Clear. Soft tissues: Negative. CT CERVICAL SPINE FINDINGS Alignment: Slight anterolisthesis of C3 on C4, favor degenerative given facet arthropathy this level. Skull base and vertebrae: No evidence of acute fracture. Vertebral body heights are maintained. Soft tissues and spinal canal: No prevertebral fluid or swelling. No visible canal hematoma. Disc levels: Moderate multilevel degenerative change including degenerative disc disease and facet/uncovertebral hypertrophy. Upper chest: Biapical pleuroparenchymal scarring. Otherwise, visualized lung apices are clear. IMPRESSION: 1. No evidence of acute intracranial abnormality or facial fracture. 2. No evidence of  acute fracture or traumatic malalignment the cervical spine. Electronically Signed   By: Margaretha Sheffield M.D.   On: 05/15/2022 19:26   CT Cervical Spine Wo Contrast  Result Date: 05/15/2022 CLINICAL DATA:  Trauma. EXAM: CT HEAD WITHOUT CONTRAST CT MAXILLOFACIAL WITHOUT CONTRAST CT CERVICAL SPINE WITHOUT CONTRAST TECHNIQUE: Multidetector CT imaging of the head, cervical spine, and maxillofacial structures were performed using the standard protocol without intravenous contrast. Multiplanar CT image reconstructions of the cervical spine and maxillofacial structures were also generated. RADIATION DOSE REDUCTION: This exam was performed according to the departmental dose-optimization program which includes automated exposure control, adjustment of the mA and/or kV according to patient size and/or use of iterative reconstruction technique. COMPARISON:  Apr 13, 2022. FINDINGS: CT HEAD FINDINGS Brain: No evidence of acute infarction, hemorrhage, hydrocephalus, extra-axial collection or mass lesion/mass effect. Vascular: No hyperdense vessel identified. Skull: No acute fracture. Other: No mastoid effusions. CT MAXILLOFACIAL FINDINGS Osseous: No fracture or mandibular dislocation. No destructive process. Orbits: Negative. No traumatic or inflammatory finding. Sinuses: Clear. Soft tissues: Negative. CT CERVICAL SPINE FINDINGS Alignment: Slight anterolisthesis of C3 on C4, favor degenerative given facet arthropathy this level. Skull base and vertebrae: No evidence of acute fracture. Vertebral body heights are maintained. Soft tissues and spinal canal: No prevertebral fluid or swelling. No  visible canal hematoma. Disc levels: Moderate multilevel degenerative change including degenerative disc disease and facet/uncovertebral hypertrophy. Upper chest: Biapical pleuroparenchymal scarring. Otherwise, visualized lung apices are clear. IMPRESSION: 1. No evidence of acute intracranial abnormality or facial fracture. 2. No  evidence of acute fracture or traumatic malalignment the cervical spine. Electronically Signed   By: Margaretha Sheffield M.D.   On: 05/15/2022 19:26   MR CARDIAC MORPHOLOGY W WO CONTRAST  Result Date: 04/18/2022 CLINICAL DATA:  Cardiomyopathy evaluation EXAM: CARDIAC MRI TECHNIQUE: The patient was scanned on a 1.5 Tesla Siemens magnet. A dedicated cardiac coil was used. Functional imaging was done using Fiesta sequences. 2,3, and 4 chamber views were done to assess for RWMA's. Modified Simpson's rule using a short axis stack was used to calculate an ejection fraction on a dedicated work Conservation officer, nature. The patient received 7 cc of Gadavist. After 10 minutes inversion recovery sequences were used to assess for infiltration and scar tissue. CONTRAST:  7 cc  of Gadavist FINDINGS: Left ventricle: -Normal size -Mild systolic dysfunction -Nonspecific ECV elevation (32%) -No LGE LV EF:  42% (Normal 56-78%) Absolute volumes: LV EDV: 151m (Normal 52-141 mL) LV ESV: 719m(Normal 13-51 mL) LV SV: 5470mNormal 33-97 mL) CO: 3.9L/min (Normal 2.7-6.0 L/min) Indexed volumes: LV EDV: 49m45m-m (Normal 41-81 mL/sq-m) LV ESV: 40mL78mm (Normal 12-21 mL/sq-m) LV SV: 29mL/71m (Normal 26-56 mL/sq-m) CI: 2.0L/min/sq-m (Normal 1.8-3.8 L/min/sq-m) Right ventricle: Normal size and systolic function RV EF: 49% (N81%al 47-80%) Absolute volumes: RV EDV: 131mL (82mal 58-154 mL) RV ESV: 67mL (N60ml 12-68 mL) RV SV: 64mL (No2m 35-98 mL) CO: 4.6L/min (Normal 2.7-6 L/min) Indexed volumes: RV EDV: 70mL/sq-m64mrmal 48-87 mL/sq-m) RV ESV: 36mL/sq-m 75mmal 11-28 mL/sq-m) RV SV: 34mL/sq-m (85mal 27-57 mL/sq-m) CI: 2.4L/min/sq-m (Normal 1.8-3.8 L/min/sq-m) Left atrium: Mild enlargement Right atrium: Normal size Mitral valve: Mild regurgitation Aortic valve: Tricuspid.  No regurgitation Tricuspid valve: Mild regurgitation Pulmonic valve: No regurgitation Aorta: Normal proximal ascending aorta Pericardium: Normal IMPRESSION: 1.   Normal LV size with mild systolic dysfunction (EF 42%) 2.  Nor01% RV size and systolic function (EF 49%) 3.  No 75%e gadolinium enhancement to suggest myocardial scar Electronically Signed   By: Christopher Oswaldo Milian05/16/2023 23:17   CARDIAC CATHETERIZATION  Result Date: 04/17/2022 Images from the original result were not included.   Prox RCA lesion is 30% stenosed. Elenna G TRANETTA ARMACOST. [id number]  [id number]: [facility] Ber[facility] Burow[facility][birth year]CEDURE:  04/17/2022 DATE OF DISCHARGE: CARDIAC CATHETERIZATION History obtained from chart review.Ms. Grose is a [place] 81 year old 74man with recurrent syncopal episodes with personal injury.  She has at least moderate left ventricular dysfunction with global hypokinesis.  Her troponin mildly elevated.  She does have a history of PAF currently in sinus rhythm on Eliquis which was held.  She has moderate LV dysfunction.  She presents now for left heart cath to define her anatomy.   Ms. Boghosian has nStaszewskiructive CAD with at most a 30% proximal dominant RCA stenosis.  She has a low LVEDP of 4.  She was in sinus rhythm at the beginning of the case and went into a drip/flutter with RVR at the end of the case suggesting that her syncope may be arrhythmogenic in nature.  I have communicated these results to Dr. [facility], who arranged the heart cath.  The sheath was removed and a TR band was placed on the right wrist to achieve patent hemostasis.  The patient left lab in stable condition.  Heparin will be started 4 hours after sheath removal. [name]. MD, [facility] 04/17/2022 1:13 PM       Subjective: Seen and examined at time of discharge.  Stable no distress.  Feels well.  Alert oriented x4.  Stable for discharge home.  Discharge Exam: Vitals:   05/17/22 0753 05/17/22 1146  BP: 112/87 (!) 116/95  Pulse: 82 67  Resp: 18 18  Temp: (!) 97.5 F (36.4 C) 97.6 F (36.4 C)   SpO2: 95% 100%   Vitals:   05/16/22 2349 05/17/22 0408 05/17/22 0753 05/17/22 1146  BP: 140/62 (!) 127/57 112/87 (!) 116/95  Pulse: 79 77 82 67  Resp: '18 17 18 18  '$ Temp: 97.9 F (36.6 C) 98.3 F (36.8 C) (!) 97.5 F (36.4 C) 97.6 F (36.4 C)  TempSrc:   Oral Oral  SpO2: 99% 97% 95% 100%  Weight:      Height:        General: Pt is alert, awake, not in acute distress Cardiovascular: RRR, S1/S2 +, no rubs, no gallops Respiratory: CTA bilaterally, no wheezing, no rhonchi Abdominal: Soft, NT, ND, bowel sounds + Extremities: no edema, no cyanosis    The results of significant diagnostics from this hospitalization (including imaging, microbiology, ancillary and laboratory) are listed below for reference.     Microbiology: Recent Results (from the past 240 hour(s))  Urine Culture     Status: Abnormal (Preliminary result)   Collection Time: 05/15/22  8:14 PM   Specimen: Urine, Random  Result Value Ref Range Status   Specimen Description   Final    URINE, RANDOM Performed at [facility], [address], [place], [place] [address]    Special Requests   Final    NONE Performed at [facility], [address] [place], [place] [address]    Culture >=100,000 COLONIES/mL GRAM NEGATIVE RODS (A)  Final   Report Status PENDING  Incomplete  Blood culture (routine x 2)     Status: None (Preliminary result)   Collection Time: 05/15/22  9:32 PM   Specimen: BLOOD  Result Value Ref Range Status   Specimen Description BLOOD LEFT ANTECUBITAL  Final   Special Requests   Final    BOTTLES DRAWN AEROBIC AND ANAEROBIC Blood Culture adequate volume   Culture   Final    NO GROWTH 2 DAYS Performed at [facility], [address] [place], [place] [address]    Report Status PENDING  Incomplete  Blood culture (routine x 2)     Status: None (Preliminary result)   Collection Time: 05/15/22  9:32 PM   Specimen: BLOOD  Result Value Ref Range Status   Specimen  Description BLOOD RIGHT ANTECUBITAL  Final   Special Requests   Final    BOTTLES DRAWN AEROBIC AND ANAEROBIC Blood Culture adequate volume   Culture   Final    NO GROWTH 2 DAYS Performed at [facility], [address] [place],  [address]    Report Status PENDING  Incomplete     Labs: BNP (last 3 results) Recent Labs    04/15/22 0537  BNP 378.5*   Basic Metabolic Panel: Recent Labs  Lab 05/15/22 1759 05/16/22 0555 05/17/22 0813  NA 132* 134* 135  K 3.5 3.0* 3.8  CL 99 106 108  CO2 '24 25 22  '$ GLUCOSE 128* 121* 94  BUN 24* 24* 15  CREATININE 1.16* 1.03* 0.78  CALCIUM 7.7* 7.1* 7.4*  MG  --   --  2.0   Liver Function Tests: No results for input(s): "AST", "ALT", "ALKPHOS", "BILITOT", "PROT", "ALBUMIN" in the last 168 hours. No results for input(s): "LIPASE", "AMYLASE" in the last 168 hours. No results for input(s): "AMMONIA" in the last 168 hours. CBC: Recent Labs  Lab 05/15/22 1759 05/16/22 0555 05/17/22 0813  WBC 12.6* 8.6 7.5  HGB 12.4 10.4* 9.7*  HCT 38.2 31.7* 29.4*  MCV 83.4 82.3 82.4  PLT 340 243 260   Cardiac Enzymes: No results for input(s): "CKTOTAL", "CKMB", "CKMBINDEX", "TROPONINI" in the last 168 hours. BNP: Invalid input(s): "POCBNP" CBG: No results for input(s): "GLUCAP" in the last 168 hours. D-Dimer No results for input(s): "DDIMER" in the last 72 hours. Hgb A1c No results for input(s): "HGBA1C" in the last 72 hours. Lipid Profile Recent Labs    05/16/22 0555  CHOL 142  HDL 22*  LDLCALC 95  TRIG 127  CHOLHDL 6.5   Thyroid function studies No results for input(s): "TSH", "T4TOTAL", "T3FREE", "THYROIDAB" in the last 72 hours.  Invalid input(s): "FREET3" Anemia work up No results for input(s): "VITAMINB12", "FOLATE", "FERRITIN", "TIBC", "IRON", "RETICCTPCT" in the last 72 hours. Urinalysis    Component Value Date/Time   COLORURINE YELLOW (A) 05/15/2022 2014   APPEARANCEUR CLOUDY (A) 05/15/2022 2014   LABSPEC  1.017 05/15/2022 2014   PHURINE 5.0 05/15/2022 2014   GLUCOSEU NEGATIVE 05/15/2022 2014   HGBUR NEGATIVE 05/15/2022 2014   BILIRUBINUR NEGATIVE 05/15/2022 2014   [place] NEGATIVE 05/15/2022 2014   PROTEINUR 100 (A) 05/15/2022 2014   NITRITE NEGATIVE 05/15/2022 2014   LEUKOCYTESUR LARGE (A) 05/15/2022 2014   Sepsis Labs Recent Labs  Lab 05/15/22 1759 05/16/22 0555 05/17/22 0813  WBC 12.6* 8.6 7.5   Microbiology Recent Results (from the past 240 hour(s))  Urine Culture     Status: Abnormal (Preliminary result)   Collection Time: 05/15/22  8:14 PM   Specimen: Urine, Random  Result Value Ref Range Status   Specimen Description   Final    URINE, RANDOM Performed at [facility], [address], [place], [place] [address]    Special Requests   Final    NONE Performed at [facility], [address] [place], [place] [address]    Culture >=100,000 COLONIES/mL GRAM NEGATIVE RODS (A)  Final   Report Status PENDING  Incomplete  Blood culture (routine x 2)     Status: None (Preliminary result)   Collection Time: 05/15/22  9:32 PM   Specimen: BLOOD  Result Value Ref Range Status   Specimen Description BLOOD LEFT ANTECUBITAL  Final   Special Requests   Final    BOTTLES DRAWN AEROBIC AND ANAEROBIC Blood Culture adequate volume   Culture   Final    NO GROWTH 2 DAYS Performed at [facility], [address], [place], [place] [address]    Report Status PENDING  Incomplete  Blood culture (routine x 2)     Status: None (Preliminary result)   Collection Time: 05/15/22  9:32 PM   Specimen: BLOOD  Result Value Ref Range Status   Specimen Description BLOOD RIGHT ANTECUBITAL  Final   Special Requests   Final    BOTTLES DRAWN AEROBIC AND ANAEROBIC Blood Culture adequate volume   Culture   Final    NO GROWTH 2 DAYS Performed at [facility], [address] [place], [place] [address]    Report Status PENDING  Incomplete     Time  coordinating discharge: Over 30 minutes  SIGNED:   Sidney Ace, MD  [facility] 05/17/2022, 12:32 PM Pager   If 7PM-7AM, please contact night-coverage

## 2022-05-17 NOTE — TOC Transition Note (Signed)
Transition of Care [facility]) - CM/SW Discharge Note   Patient Details  Name: Caroline Zamora MRN: [id number] Date of Birth: 01-03-1941  Transition of Care [facility] CM/SW Contact:  Donnelly Angelica, LCSW Phone Number: 05/17/2022, 1:18 PM   Clinical Narrative:    Pt has orders to discharge today. HH with [facility] has been arranged for Pt, OT, RN. No further concerns. CSW signing off.     Final next level of care: [place] Barriers to Discharge: Barriers Resolved   Patient Goals and CMS Choice Patient states their goals for this hospitalization and ongoing recovery are:: Pt wants to go home.      Discharge Placement                    Patient and family notified of of transfer: 05/17/22  Discharge Plan and Services   Discharge Planning Services: CM Consult            DME Arranged: N/A DME Agency: NA       HH Arranged: Patient Refused [facility] Agency: NA        Social Determinants of Health (SDOH) Interventions     Readmission Risk Interventions     No data to display

## 2022-05-17 NOTE — Evaluation (Signed)
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 Pt greeted at edge of bed after bathing with NT. Reports she has not been wearing recommended TED hose or abdominal binder. Provided education on donning and use during mobility. Pt presents with deficits in activity tolerance, would benefit from home OT to address deficits, also to assist with optimal home set up for ADL completion. Pt is left as received, NAD, all needs met. OT will follow acutely.     Recommendations for follow up therapy are one component of a multi-disciplinary discharge planning process, led by the attending physician.  Recommendations may be updated based on patient status, additional functional criteria and insurance authorization.   Follow Up Recommendations  Home health OT    Assistance Recommended at Discharge Intermittent Supervision/Assistance  Patient can return home with the following A little help with bathing/dressing/bathroom    Functional Status Assessment  Patient has had a recent decline in their functional status and demonstrates the ability to make significant improvements in function in a reasonable and predictable amount of time.  Equipment Recommendations  None recommended by OT    Recommendations for Other Services       Precautions / Restrictions  Precautions Precautions: Fall Precaution Comments: Target HR 65-105 bpm Restrictions Weight Bearing Restrictions: Yes RLE Weight Bearing: Weight bearing as tolerated      Mobility Bed Mobility Overal bed mobility: Independent                  Transfers Overall transfer level: Modified independent Equipment used: Rolling walker (2 wheels)                      Balance Overall balance assessment: Needs assistance Sitting-balance support: No upper extremity supported, Feet supported Sitting balance-Leahy Scale: Normal     Standing balance support: Bilateral upper extremity supported, During functional activity, Reliant on assistive device for balance Standing balance-Leahy Scale: Good                             ADL either performed or assessed with clinical judgement   ADL Overall ADL's : Needs assistance/impaired     Grooming: Wash/dry hands;Standing;Supervision/safety           Upper Body Dressing : Set up;Sitting   Lower Body Dressing: Moderate assistance;Set up Lower Body Dressing Details (indicate cue type and reason): MAX A for donning TED hose, SET UP for socks Toilet Transfer: Min Psychiatric nurse Details (indicate cue type and reason): vcs for safety and decreasing descent of speed Toileting- Clothing Manipulation and Hygiene: Supervision/safety;Sit to/from stand       Functional mobility during ADLs: Supervision/safety;Rolling walker (2 wheels)       Vision Patient Visual Report: No change from baseline       Perception     Praxis  Pertinent Vitals/Pain Pain Assessment Pain Assessment: No/denies pain     Hand Dominance     Extremity/Trunk Assessment Upper Extremity Assessment Upper Extremity Assessment: Generalized weakness   Lower Extremity Assessment Lower Extremity Assessment: Generalized weakness   Cervical / Trunk Assessment Cervical / Trunk Assessment: Normal   Communication  Communication Communication: No difficulties   Cognition Arousal/Alertness: Awake/alert Behavior During Therapy: WFL for tasks assessed/performed Overall Cognitive Status: Within Functional Limits for tasks assessed                                       General Comments  Bp in standing 120/70 HR 81, after 1 min in standing 118/73 after 3 minutes 137/94    Exercises Other Exercises Other Exercises: edu re: use of TED hose and abdominal binder, safe ADL completion, falls prevention, home safety   Shoulder Instructions      Home Living Family/patient expects to be discharged to:: Private residence Living Arrangements: Alone Available Help at Discharge: Family;Available PRN/intermittently Type of Home: House Home Access: Stairs to enter [employer] of Steps: 4 (B railings from back entrance; L railing from front entrance)   Home Layout: Two level;1/2 bath on main level;Bed/bath upstairs Alternate Level Stairs-Number of Steps: flight of steps Alternate Level Stairs-Rails: Left Bathroom Shower/Tub: Tub/shower unit;Walk-in shower   Bathroom Toilet: Handicapped height     Home Equipment: Standard Walker;Cane - single Environmental consultant (2 wheels)          Prior Functioning/Environment Prior Level of Function : Needs assist;History of Falls (last six months)             Mobility Comments: pt report amb without AD with fall history 2* recurrent syncope ADLs Comments: pt reports she has been requiring assist for IADL from son and his wife, mod I for ADL        OT Problem List: Decreased activity tolerance;Decreased knowledge of use of DME or AE      OT Treatment/Interventions: Self-care/ADL training;Patient/family education;DME and/or AE instruction;Therapeutic activities    OT Goals(Current goals can be found in the care plan section) Acute Rehab OT Goals Patient Stated Goal: go home OT Goal Formulation: With patient Time For  Goal Achievement: 05/31/22 ADL Goals Pt Will Transfer to Toilet: with modified independence;ambulating Pt Will Perform Toileting - Clothing Manipulation and hygiene: with modified independence;sit to/from stand  OT Frequency: Min 2X/week    Co-evaluation              AM-PAC OT "6 Clicks" Daily Activity     Outcome Measure Help from another person eating meals?: None Help from another person taking care of personal grooming?: None Help from another person toileting, which includes using toliet, bedpan, or urinal?: None Help from another person bathing (including washing, rinsing, drying)?: A Little Help from another person to put on and taking off regular upper body clothing?: None Help from another person to put on and taking off regular lower body clothing?: A Lot 6 Click Score: 21   End of Session Equipment Utilized During Treatment: Gait belt Nurse Communication: Mobility status  Activity Tolerance: Patient tolerated treatment well Patient left: in bed;with call bell/phone within reach;with bed alarm set  OT Visit Diagnosis: Repeated falls (R29.6);Muscle weakness (generalized) (M62.81)                Time: 3536-1443 OT Time Calculation (min): 32 min Charges:  OT General Charges $  OT Visit: 1 Visit OT Evaluation $OT Eval Moderate Complexity: 1 Mod OT Treatments $Self Care/Home Management : 8-22 mins  Shanon Payor, OTD OTR/L  05/17/22, 1:07 PM

## 2022-05-18 LAB — URINE CULTURE: Culture: >=100000 — AB

## 2022-05-19 NOTE — Addendum Note (Signed)
Encounter addended by: Gerarda Gunther on: 05/19/2022 4:35 PM  Actions taken: Imaging Exam ended

## 2022-05-20 LAB — CULTURE, BLOOD (ROUTINE X 2)
Culture: NO GROWTH
Culture: NO GROWTH
Special Requests: ADEQUATE
Special Requests: ADEQUATE

## 2022-08-03 ENCOUNTER — Ambulatory Visit: Payer: Medicare HMO | Attending: Internal Medicine | Admitting: Internal Medicine

## 2022-08-10 ENCOUNTER — Ambulatory Visit: Payer: Medicare HMO | Admitting: Internal Medicine

## 2022-08-29 ENCOUNTER — Telehealth: Payer: Self-pay | Admitting: Internal Medicine

## 2022-08-29 NOTE — Telephone Encounter (Signed)
Unable to leave vm. Patient needs a f/u with Dr. Caryl Comes

## 2022-08-29 NOTE — Telephone Encounter (Signed)
-----   Message from April Garrison, [place] sent at 08/28/2022  1:57 PM EDT -----  ----- Message ----- From: Shirley Friar, Hershal Coria Sent: 08/28/2022   9:48 AM EDT To: Alben Spittle; April Garrison, CMA  Known Paroxysmal atrial fibrillation.  This monitor is from several months ago.   She cancelled and no showed two recent appointments with Dr. Caryl Comes.  Needs to be scheduled to see Dr. Caryl Comes in [place].

## 2022-10-13 ENCOUNTER — Other Ambulatory Visit: Payer: Self-pay | Admitting: Internal Medicine

## 2022-10-23 ENCOUNTER — Other Ambulatory Visit: Payer: Self-pay | Admitting: Internal Medicine

## 2022-10-24 ENCOUNTER — Ambulatory Visit: Payer: Medicare HMO | Attending: Internal Medicine | Admitting: Internal Medicine

## 2022-10-24 ENCOUNTER — Encounter: Payer: Self-pay | Admitting: Internal Medicine

## 2022-10-24 VITALS — BP 146/77 | HR 79 | Ht 66.0 in | Wt 182.4 lb

## 2022-10-24 DIAGNOSIS — R55 Syncope and collapse: Secondary | ICD-10-CM

## 2022-10-24 DIAGNOSIS — Z79899 Other long term (current) drug therapy: Secondary | ICD-10-CM

## 2022-10-24 DIAGNOSIS — I480 Paroxysmal atrial fibrillation: Secondary | ICD-10-CM | POA: Diagnosis not present

## 2022-10-24 DIAGNOSIS — I493 Ventricular premature depolarization: Secondary | ICD-10-CM | POA: Diagnosis not present

## 2022-10-24 DIAGNOSIS — I4510 Unspecified right bundle-branch block: Secondary | ICD-10-CM | POA: Diagnosis not present

## 2022-10-24 MED ORDER — CARVEDILOL 3.125 MG PO TABS: ORAL_TABLET | ORAL | 3 refills | Status: DC

## 2022-10-24 NOTE — Progress Notes (Signed)
Patient Care Team: Kirk Ruths, MD as PCP - General (Internal Medicine)   HPI  Caroline Zamora is a 81 y.o. female seen in follow-up for orthostatic syncope and complex ventricular ectopy and modest nonischemic cardiomyopathy; interval atrial fibrillation started on amio and Apixaban     Hospitalized for syncope following my initial evaluation, found to have orthostatic hypotension, underwent catheterization and cMRI and was discharged with recommendation for monitoring  Doing well without recurrent weak spells; Patient denies symptoms of respiratory, GI intolerance, sun sensitivity, neurological symptoms attributable to amiodarone.            DATE TEST EF    8/20 Echo   55-60 %    1/23 Echo   35 % LAE with that  1/23 MYOVIEW 59% Normal perfusion  5/23 cMRI 42%     Date Cr K Hgb LDL TSH LFTs  9/21     14.5      3/23 0.9 4.2   130    7/23   13.4<< 9.7 (?)   12 12.  9/23     4.8     Records and Results Reviewed   Past Medical History:  Diagnosis Date   Arthritis    lumbar spine   CKD (chronic kidney disease), stage II    Colon cancer (HCC)    Depression    Dysphagia    Headache    migraine   Hx of adenomatous colonic polyps    Hyperlipidemia    Hypertension    PAF (paroxysmal atrial fibrillation) (HCC)    Paroxysmal atrial fibrillation ([place])    Skin cancer    Syncope     Past Surgical History:  Procedure Laterality Date   ABDOMINAL HYSTERECTOMY     with removal of tubes and ovaries   ANKLE FRACTURE SURGERY Right    COLONOSCOPY     COLONOSCOPY WITH PROPOFOL N/A 09/03/2017   Procedure: COLONOSCOPY WITH PROPOFOL;  Surgeon: Manya Silvas, MD;  Location: [facility] ENDOSCOPY;  Service: Endoscopy;  Laterality: N/A;   COLONOSCOPY WITH PROPOFOL N/A 09/13/2020   Procedure: COLONOSCOPY WITH PROPOFOL;  Surgeon: Lesly Rubenstein, MD;  Location: [facility] ENDOSCOPY;  Service: Endoscopy;  Laterality: N/A;   DILATION AND CURETTAGE OF UTERUS      ESOPHAGOGASTRODUODENOSCOPY     ESOPHAGOGASTRODUODENOSCOPY (EGD) WITH PROPOFOL N/A 09/13/2020   Procedure: ESOPHAGOGASTRODUODENOSCOPY (EGD) WITH PROPOFOL;  Surgeon: Lesly Rubenstein, MD;  Location: [facility] ENDOSCOPY;  Service: Endoscopy;  Laterality: N/A;   LEFT HEART CATH AND CORONARY ANGIOGRAPHY N/A 04/17/2022   Procedure: LEFT HEART CATH AND CORONARY ANGIOGRAPHY;  Surgeon: Lorretta Harp, MD;  Location: [facility];  Service: Cardiovascular;  Laterality: N/A;   skin cancer removal     TONSILLECTOMY     TUBAL LIGATION      Current Meds  Medication Sig   acetaminophen (TYLENOL) 500 MG tablet Take 500 mg by mouth every 6 (six) hours as needed for mild pain.   amiodarone (PACERONE) 200 MG tablet Take 1 tablet (200 mg) by mouth once daily   apixaban (ELIQUIS) 5 MG TABS tablet Take 5 mg by mouth 2 (two) times daily.   carvedilol (COREG) 3.125 MG tablet TAKE 1 TABLET BY MOUTH TWICE A DAY WITH FOOD   estradiol (ESTRACE) 0.5 MG tablet Take 0.5 mg by mouth at bedtime.   hydrOXYzine (ATARAX) 25 MG tablet Take 25 mg by mouth 2 (two) times daily as needed for anxiety.   venlafaxine XR (EFFEXOR-XR)  150 MG 24 hr capsule Take 150 mg by mouth daily.    Allergies  Allergen Reactions   Desyrel [Trazodone] Rash   Sulfa Antibiotics Rash      Review of Systems negative except from HPI and PMH  Physical Exam BP (!) 146/77 (BP Location: Left Arm, Patient Position: Sitting, Cuff Size: Normal)   Pulse 79   Ht '5\' 6"'$  (1.676 m)   Wt 182 lb 6 oz (82.7 kg)   LMP  (LMP Unknown)   SpO2 96%   BMI 29.44 kg/m  Well developed and nourished in no acute distress HENT normal Neck supple Clear Regular rate and rhythm, no murmurs or gallops Abd-soft with active BS No Clubbing cyanosis edema Skin-warm and dry A & Oriented  Grossly normal sensory and motor function  ECG sinus  79  16/11/43 RSr'  CrCl cannot be calculated (Patient's most recent lab result is older than the maximum 21 days  allowed.).   Assessment and  Plan Syncope abrupt in onset/offset  Orthostatic hypotension   Atrial fibrillation   PVCs-left bundle inferior axis   Dyspnea on exertion   Cardiomyopathy?   Incomplete right bundle branch block  No intercurrent atrial fibrillation or flutter; continue amio 200 mg; surveillance labs are normal 7 & 9/23 On Anticoagulant for thromboembolic risk reduction.  No bleeding issues.  Continue Apixaban   at 5 bid  No orthostatic symptoms,  we will have to tolerate systolic hypertension but will increase her pm carvedilol 3.125 >>6.25  DOE better . Marland Kitchen

## 2022-10-24 NOTE — Patient Instructions (Signed)
Medication Instructions:  - Your physician has recommended you make the following change in your medication:   1) CHANGE Coreg (carvedilol) 3.125 mg: - take 1 tablet (3.125 mg) in the AM & take 2 tablets (6.25 mg) in the PM  *If you need a refill on your cardiac medications before your next appointment, please call your pharmacy*   Lab Work: - none ordered  If you have labs (blood work) drawn today and your tests are completely normal, you will receive your results only by: [place] (if you have MyChart) OR A paper copy in the mail If you have any lab test that is abnormal or we need to change your treatment, we will call you to review the results.   Testing/Procedures: - none ordered   Follow-Up: At [facility], you and your health needs are our priority.  As part of our continuing mission to provide you with exceptional heart care, we have created designated Provider Care Teams.  These Care Teams include your primary Cardiologist (physician) and Advanced Practice Providers (APPs -  Physician Assistants and Nurse Practitioners) who all work together to provide you with the care you need, when you need it.  We recommend signing up for the patient portal called "MyChart".  Sign up information is provided on this After Visit Summary.  MyChart is used to connect with patients for Virtual Visits (Telemedicine).  Patients are able to view lab/test results, encounter notes, upcoming appointments, etc.  Non-urgent messages can be sent to your provider as well.   To learn more about what you can do with MyChart, go to [email or web address].    Your next appointment:   6 month(s)  The format for your next appointment:   In Person  Provider:   Virl Axe, MD    Other Instructions N/a  Important Information About Sugar

## 2022-12-11 ENCOUNTER — Emergency Department: Payer: Medicare HMO

## 2022-12-11 ENCOUNTER — Inpatient Hospital Stay
Admission: EM | Admit: 2022-12-11 | Discharge: 2022-12-14 | DRG: 194 | Disposition: A | Discharge status: Home / Self Care | Payer: Medicare HMO | Attending: Student in an Organized Health Care Education/Training Program | Admitting: Student in an Organized Health Care Education/Training Program

## 2022-12-11 ENCOUNTER — Encounter: Payer: Self-pay | Admitting: Family Medicine

## 2022-12-11 ENCOUNTER — Other Ambulatory Visit: Payer: Self-pay

## 2022-12-11 DIAGNOSIS — I2510 Atherosclerotic heart disease of native coronary artery without angina pectoris: Secondary | ICD-10-CM | POA: Diagnosis present

## 2022-12-11 DIAGNOSIS — I951 Orthostatic hypotension: Secondary | ICD-10-CM | POA: Diagnosis present

## 2022-12-11 DIAGNOSIS — Z1152 Encounter for screening for COVID-19: Secondary | ICD-10-CM

## 2022-12-11 DIAGNOSIS — K760 Fatty (change of) liver, not elsewhere classified: Secondary | ICD-10-CM | POA: Diagnosis present

## 2022-12-11 DIAGNOSIS — W19XXXA Unspecified fall, initial encounter: Secondary | ICD-10-CM

## 2022-12-11 DIAGNOSIS — R55 Syncope and collapse: Secondary | ICD-10-CM | POA: Diagnosis not present

## 2022-12-11 DIAGNOSIS — N182 Chronic kidney disease, stage 2 (mild): Secondary | ICD-10-CM | POA: Diagnosis present

## 2022-12-11 DIAGNOSIS — Z833 Family history of diabetes mellitus: Secondary | ICD-10-CM

## 2022-12-11 DIAGNOSIS — I4892 Unspecified atrial flutter: Secondary | ICD-10-CM | POA: Diagnosis present

## 2022-12-11 DIAGNOSIS — Z9181 History of falling: Secondary | ICD-10-CM

## 2022-12-11 DIAGNOSIS — I480 Paroxysmal atrial fibrillation: Secondary | ICD-10-CM | POA: Diagnosis present

## 2022-12-11 DIAGNOSIS — I130 Hypertensive heart and chronic kidney disease with heart failure and stage 1 through stage 4 chronic kidney disease, or unspecified chronic kidney disease: Secondary | ICD-10-CM | POA: Diagnosis present

## 2022-12-11 DIAGNOSIS — I5020 Unspecified systolic (congestive) heart failure: Secondary | ICD-10-CM | POA: Diagnosis present

## 2022-12-11 DIAGNOSIS — F411 Generalized anxiety disorder: Secondary | ICD-10-CM | POA: Diagnosis present

## 2022-12-11 DIAGNOSIS — Z7901 Long term (current) use of anticoagulants: Secondary | ICD-10-CM

## 2022-12-11 DIAGNOSIS — I482 Chronic atrial fibrillation, unspecified: Secondary | ICD-10-CM

## 2022-12-11 DIAGNOSIS — I428 Other cardiomyopathies: Secondary | ICD-10-CM | POA: Diagnosis present

## 2022-12-11 DIAGNOSIS — R7989 Other specified abnormal findings of blood chemistry: Secondary | ICD-10-CM | POA: Insufficient documentation

## 2022-12-11 DIAGNOSIS — E876 Hypokalemia: Secondary | ICD-10-CM | POA: Diagnosis present

## 2022-12-11 DIAGNOSIS — F410 Panic disorder [episodic paroxysmal anxiety] without agoraphobia: Secondary | ICD-10-CM

## 2022-12-11 DIAGNOSIS — R531 Weakness: Secondary | ICD-10-CM

## 2022-12-11 DIAGNOSIS — J189 Pneumonia, unspecified organism: Principal | ICD-10-CM

## 2022-12-11 DIAGNOSIS — N179 Acute kidney failure, unspecified: Secondary | ICD-10-CM | POA: Diagnosis present

## 2022-12-11 DIAGNOSIS — E861 Hypovolemia: Secondary | ICD-10-CM | POA: Diagnosis present

## 2022-12-11 DIAGNOSIS — E1122 Type 2 diabetes mellitus with diabetic chronic kidney disease: Secondary | ICD-10-CM | POA: Diagnosis present

## 2022-12-11 DIAGNOSIS — R9431 Abnormal electrocardiogram [ECG] [EKG]: Secondary | ICD-10-CM | POA: Insufficient documentation

## 2022-12-11 DIAGNOSIS — Z90710 Acquired absence of both cervix and uterus: Secondary | ICD-10-CM

## 2022-12-11 DIAGNOSIS — I4891 Unspecified atrial fibrillation: Secondary | ICD-10-CM

## 2022-12-11 DIAGNOSIS — Z85828 Personal history of other malignant neoplasm of skin: Secondary | ICD-10-CM

## 2022-12-11 DIAGNOSIS — Z79899 Other long term (current) drug therapy: Secondary | ICD-10-CM

## 2022-12-11 DIAGNOSIS — E119 Type 2 diabetes mellitus without complications: Secondary | ICD-10-CM

## 2022-12-11 DIAGNOSIS — Z85038 Personal history of other malignant neoplasm of large intestine: Secondary | ICD-10-CM

## 2022-12-11 DIAGNOSIS — E860 Dehydration: Secondary | ICD-10-CM | POA: Diagnosis present

## 2022-12-11 DIAGNOSIS — E785 Hyperlipidemia, unspecified: Secondary | ICD-10-CM | POA: Diagnosis present

## 2022-12-11 DIAGNOSIS — R296 Repeated falls: Secondary | ICD-10-CM | POA: Diagnosis present

## 2022-12-11 HISTORY — DX: Type 2 diabetes mellitus without complications: E11.9

## 2022-12-11 LAB — TROPONIN I (HIGH SENSITIVITY)
Troponin I (High Sensitivity): 22 ng/L — ABNORMAL HIGH (ref <18)
Troponin I (High Sensitivity): 20 ng/L — ABNORMAL HIGH (ref <18)

## 2022-12-11 LAB — CBC
HCT: 45 % (ref 36.0–46.0)
Hemoglobin: 14.7 g/dL (ref 12.0–15.0)
MCH: 30.6 pg (ref 26.0–34.0)
MCHC: 32.7 g/dL (ref 30.0–36.0)
MCV: 93.6 fL (ref 80.0–100.0)
Platelets: 339 10*3/uL (ref 150–400)
RBC: 4.81 MIL/uL (ref 3.87–5.11)
RDW: 15.4 % (ref 11.5–15.5)
WBC: 12.2 10*3/uL — ABNORMAL HIGH (ref 4.0–10.5)
nRBC: 0 % (ref 0.0–0.2)

## 2022-12-11 LAB — BASIC METABOLIC PANEL
Anion gap: 14 (ref 5–15)
BUN: 17 mg/dL (ref 8–23)
CO2: 22 mmol/L (ref 22–32)
Calcium: 8.2 mg/dL — ABNORMAL LOW (ref 8.9–10.3)
Chloride: 97 mmol/L — ABNORMAL LOW (ref 98–111)
Creatinine, Ser: 1.33 mg/dL — ABNORMAL HIGH (ref 0.44–1.00)
GFR, Estimated: 41 mL/min — ABNORMAL LOW (ref >60)
Glucose, Bld: 188 mg/dL — ABNORMAL HIGH (ref 70–99)
Potassium: 3.1 mmol/L — ABNORMAL LOW (ref 3.5–5.1)
Sodium: 133 mmol/L — ABNORMAL LOW (ref 135–145)

## 2022-12-11 LAB — RESP PANEL BY RT-PCR (RSV, FLU A&B, COVID)  RVPGX2
Influenza A by PCR: NEGATIVE
Influenza B by PCR: NEGATIVE
Resp Syncytial Virus by PCR: NEGATIVE
SARS Coronavirus 2 by RT PCR: NEGATIVE

## 2022-12-11 LAB — CBG MONITORING, ED: Glucose-Capillary: 113 mg/dL — ABNORMAL HIGH (ref 70–99)

## 2022-12-11 LAB — D-DIMER, QUANTITATIVE: D-Dimer, Quant: 2.4 ug/mL-FEU — ABNORMAL HIGH (ref 0.00–0.50)

## 2022-12-11 LAB — MAGNESIUM: Magnesium: 2.1 mg/dL (ref 1.7–2.4)

## 2022-12-11 MED ORDER — SODIUM CHLORIDE 0.9 % IV BOLUS
500.0000 mL | Freq: Once | INTRAVENOUS | Status: AC
  Administered 2022-12-11: 500 mL via INTRAVENOUS

## 2022-12-11 MED ORDER — AMIODARONE HCL 200 MG PO TABS: 200.0000 mg | ORAL_TABLET | Freq: Every day | ORAL | Status: DC

## 2022-12-11 MED ORDER — MAGNESIUM SULFATE IN D5W 1-5 GM/100ML-% IV SOLN
1.0000 g | Freq: Once | INTRAVENOUS | Status: AC
  Administered 2022-12-11: 1 g via INTRAVENOUS
  Filled 2022-12-11: qty 100

## 2022-12-11 MED ORDER — INSULIN ASPART 100 UNIT/ML IJ SOLN: 0.0000 [IU] | Freq: Three times a day (TID) | INTRAMUSCULAR | Status: DC

## 2022-12-11 MED ORDER — INSULIN ASPART 100 UNIT/ML IJ SOLN: 3.0000 [IU] | Freq: Three times a day (TID) | INTRAMUSCULAR | Status: DC

## 2022-12-11 MED ORDER — VENLAFAXINE HCL ER 150 MG PO CP24: 150.0000 mg | ORAL_CAPSULE | Freq: Every day | ORAL | Status: DC

## 2022-12-11 MED ORDER — ONDANSETRON HCL 4 MG/2ML IJ SOLN: 4.0000 mg | Freq: Four times a day (QID) | INTRAMUSCULAR | Status: DC | PRN

## 2022-12-11 MED ORDER — SODIUM CHLORIDE 0.9 % IV SOLN
2.0000 g | INTRAVENOUS | Status: DC
  Administered 2022-12-11 – 2022-12-14 (×3): 2 g via INTRAVENOUS
  Filled 2022-12-11 (×2): qty 20
  Filled 2022-12-11: qty 2
  Filled 2022-12-11: qty 20

## 2022-12-11 MED ORDER — ONDANSETRON HCL 4 MG PO TABS: 4.0000 mg | ORAL_TABLET | Freq: Four times a day (QID) | ORAL | Status: DC | PRN

## 2022-12-11 MED ORDER — OXYCODONE-ACETAMINOPHEN 5-325 MG PO TABS
1.0000 | ORAL_TABLET | Freq: Once | ORAL | Status: AC
  Administered 2022-12-11: 1 via ORAL
  Filled 2022-12-11: qty 1

## 2022-12-11 MED ORDER — APIXABAN 5 MG PO TABS
5.0000 mg | ORAL_TABLET | Freq: Two times a day (BID) | ORAL | Status: DC
  Administered 2022-12-12 – 2022-12-14 (×6): 5 mg via ORAL
  Filled 2022-12-11 (×6): qty 1

## 2022-12-11 MED ORDER — POTASSIUM CHLORIDE CRYS ER 20 MEQ PO TBCR
40.0000 meq | EXTENDED_RELEASE_TABLET | Freq: Once | ORAL | Status: AC
  Administered 2022-12-11: 40 meq via ORAL
  Filled 2022-12-11: qty 2

## 2022-12-11 MED ORDER — ONDANSETRON HCL 4 MG/2ML IJ SOLN
4.0000 mg | Freq: Once | INTRAMUSCULAR | Status: AC
  Administered 2022-12-11: 4 mg via INTRAVENOUS
  Filled 2022-12-11: qty 2

## 2022-12-11 MED ORDER — MORPHINE SULFATE (PF) 4 MG/ML IV SOLN
4.0000 mg | Freq: Once | INTRAVENOUS | Status: AC
  Administered 2022-12-11: 4 mg via INTRAVENOUS
  Filled 2022-12-11: qty 1

## 2022-12-11 MED ORDER — ENOXAPARIN SODIUM 40 MG/0.4ML IJ SOSY
40.0000 mg | PREFILLED_SYRINGE | Freq: Every day | INTRAMUSCULAR | Status: DC
  Administered 2022-12-11: 40 mg via SUBCUTANEOUS
  Filled 2022-12-11: qty 0.4

## 2022-12-11 MED ORDER — SODIUM CHLORIDE 0.9 % IV SOLN: INTRAVENOUS | Status: DC

## 2022-12-11 MED ORDER — SODIUM CHLORIDE 0.9% FLUSH
3.0000 mL | Freq: Two times a day (BID) | INTRAVENOUS | Status: DC
  Administered 2022-12-11 – 2022-12-13 (×4): 3 mL via INTRAVENOUS

## 2022-12-11 MED ORDER — SODIUM CHLORIDE 0.9 % IV SOLN
500.0000 mg | INTRAVENOUS | Status: DC
  Administered 2022-12-11: 500 mg via INTRAVENOUS
  Filled 2022-12-11: qty 5

## 2022-12-11 MED ORDER — IOHEXOL 350 MG/ML SOLN
75.0000 mL | Freq: Once | INTRAVENOUS | Status: AC | PRN
  Administered 2022-12-11: 75 mL via INTRAVENOUS

## 2022-12-11 MED ORDER — INSULIN ASPART 100 UNIT/ML IJ SOLN: 0.0000 [IU] | Freq: Every day | INTRAMUSCULAR | Status: DC

## 2022-12-11 MED ORDER — POTASSIUM CHLORIDE IN NACL 20-0.9 MEQ/L-% IV SOLN
INTRAVENOUS | Status: DC
  Filled 2022-12-11 (×6): qty 1000

## 2022-12-11 NOTE — Assessment & Plan Note (Signed)
Euvolemic  Cont home regimen  Strict Is and Os and daily weights  Follow

## 2022-12-11 NOTE — Assessment & Plan Note (Addendum)
Worsening SOB and decompensated syncope w/ noted RUL infiltrate on imaging  WBC 12 No hypoxia Given symptoms and overall presentation, will place on IV rocephin and azithromycin for infectious coverage  Supplemental O2 prn  Follow

## 2022-12-11 NOTE — Assessment & Plan Note (Signed)
Trop in 20s on presentation  No active CP Suspect minimal to mild heart strain  Cont home regimen  Prn NTG  Plan for formal cardiology consult in am  Follow

## 2022-12-11 NOTE — Assessment & Plan Note (Signed)
Heart rate relatively stable  Cont home regimen including amiodarone, eliquis  Telemetry monitoring  Follow closely

## 2022-12-11 NOTE — ED Triage Notes (Signed)
Pt to ED [facility] from home for increased weakness and dizziness since last night. Fall a week ago, hematoma to left hip and c/o left hip pain. Reports fall today onto floor from weakness and "legs giving out" . Reports several falls recently from weakness. +hit head on carpet today, takes blood thinners.

## 2022-12-11 NOTE — Assessment & Plan Note (Addendum)
Recurring issue with noted prior admission June 2023 with extensive cardiac workup No reported recent medication changes apart from decrease on BB by cardiology in the outpatient setting  EKG fairly stable apart from mild increase in QT prolongation- hold offending agents  Borderline elevated trop w/ minimal chest pressure  Suspect possible decompensation in setting of worsening SOB over past 1-2 days w/ RUL PNA on imaging  Gentle IVF hydration  Will place on CAP treatment  Telemetry monitoring overnight  Plan for formal cardiology consult in am

## 2022-12-11 NOTE — Assessment & Plan Note (Signed)
QTc 530s on presentation w/ acute on chronic syncope  Will hold potential offending medications including amiodarone and metoprolol for now pending re-evaluation by cardiology  Follow closely

## 2022-12-11 NOTE — Assessment & Plan Note (Signed)
Recurring issue in the setting of recurrent syncope and collapse at home Imaging stable apart from noted subacute T12 compression fracture Fall precautions Monitor for now PT OT evaluation

## 2022-12-11 NOTE — ED Provider Notes (Signed)
[facility] Provider Note    Event Date/Time   First MD Initiated Contact with Patient 12/11/22 1501     (approximate)   History   Weakness   HPI  Caroline Zamora is a 82 y.o. 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  Patient states that she has had multiple falls over the past couple of weeks but states that it has worsened since last night.  Feels like she has heart palpitations and her heart skips a beat and feels like her legs just gave out from underneath her.  Feels like she does not get blood to her entire body.  States that she fell last night and then again today.  Does endorse hitting her head but no loss of consciousness.  Complaining of some mild back pain but she is uncertain if this is from this fall or another fall previously.  Denies dysuria, urinary urgency or frequency.  Does endorse decreased urine output.  Also endorses worsening shortness of breath.  Denies prior history of DVT or PE.     Physical Exam   Triage Vital Signs: ED Triage Vitals  Enc Vitals Group     BP 12/11/22 0917 128/83     Pulse Rate 12/11/22 0917 95     Resp 12/11/22 0917 20     Temp 12/11/22 0917 98.7 F (37.1 C)     Temp src --      SpO2 12/11/22 0917 100 %     Weight 12/11/22 0919 180 lb (81.6 kg)     Height 12/11/22 0919 '5\' 6"'$  (1.676 m)     Head Circumference --      Peak Flow --      Pain Score 12/11/22 0919 5     Pain Loc --      Pain Edu? --      Excl. in [place]? --     Most recent vital signs: Vitals:   12/11/22 2300 12/11/22 2315  BP: (!) 155/71   Pulse: 78   Resp: (!) 30 (!) 25  Temp:    SpO2: 98%     Physical Exam Constitutional:      Appearance: She is well-developed.  HENT:     Head: Atraumatic.  Eyes:     Conjunctiva/sclera: Conjunctivae normal.   Cardiovascular:     Rate and Rhythm: Regular rhythm.     Heart sounds: No murmur heard. Pulmonary:     Effort: No respiratory distress.  Abdominal:     General: There is no distension.  Musculoskeletal:        General: Normal range of motion.     Cervical back: Normal range of motion.     Comments: Mild midline thoracic tenderness palpation mild left hip tenderness.  No tenderness to the right hip.  No midline cervical spine tenderness.  Skin:    General: Skin is warm.  Neurological:     Mental Status: She is alert. Mental status is at baseline.  Psychiatric:        Mood and Affect: Mood normal.     IMPRESSION / MDM / [place] / ED COURSE  I reviewed the triage vital signs and the nursing notes.  Differential diagnosis including intracranial hemorrhage, dehydration, electrolyte abnormality, viral illness including COVID/influenza, urinary tract infection, dysrhythmia  EKG  I, Nathaniel Man, the attending physician, personally viewed and  interpreted this ECG.   Rate: Normal  Rhythm: Normal sinus  Axis: Normal  Intervals: Prolonged QTc - 534  ST&T Change: Nonspecific ST changes  No tachycardic or bradycardic dysrhythmias while on cardiac telemetry.  RADIOLOGY I independently reviewed imaging, my interpretation of imaging: X-ray concerning for compression fracture  Moderate T12 compression deformity concerning for old fracture.  Low risk Wells criteria, D-dimer is positive so we will obtain CT angiography  Discussed with radiology, CT angiography without signs of pulmonary embolism but did note a burst fracture of T12.  No signs of a pulmonary embolism.  Ordered CT scan of the thoracic and lumbar spine.  LABS (all labs ordered are listed, but only abnormal results are displayed) Labs interpreted as -  Museum 2.1.  Potassium low at 3.1.  Troponin stable at 22.  D-dimer elevated so obtained a CT angiography  Labs Reviewed  BASIC METABOLIC PANEL - Abnormal;  Notable for the following components:      Result Value   Sodium 133 (*)    Potassium 3.1 (*)    Chloride 97 (*)    Glucose, Bld 188 (*)    Creatinine, Ser 1.33 (*)    Calcium 8.2 (*)    GFR, Estimated 41 (*)    All other components within normal limits  CBC - Abnormal; Notable for the following components:   WBC 12.2 (*)    All other components within normal limits  D-DIMER, QUANTITATIVE - Abnormal; Notable for the following components:   D-Dimer, Quant 2.40 (*)    All other components within normal limits  TROPONIN I (HIGH SENSITIVITY) - Abnormal; Notable for the following components:   Troponin I (High Sensitivity) 22 (*)    All other components within normal limits  TROPONIN I (HIGH SENSITIVITY) - Abnormal; Notable for the following components:   Troponin I (High Sensitivity) 20 (*)    All other components within normal limits  RESP PANEL BY RT-PCR (RSV, FLU A&B, COVID)  RVPGX2  MAGNESIUM  URINALYSIS, ROUTINE W REFLEX MICROSCOPIC  COMPREHENSIVE METABOLIC PANEL  CBC WITH DIFFERENTIAL/PLATELET  HIV ANTIBODY (ROUTINE TESTING W REFLEX)  HEMOGLOBIN A1C  TROPONIN I (HIGH SENSITIVITY)    TREATMENT    1 L of IV fluids, p.o. potassium, IV magnesium for prolonged QTc and electrolyte abnormality.  Consulted hospitalist for admission for recurrent episodes of syncope with prolonged QTc and electrolyte abnormalities.  CT scan with signs of pneumonia.  Patient denies any cough or fever.  Discussed with the hospitalist of possibly treating for pneumonia.  PROCEDURES:  Critical Care performed: No  Procedures  Patient's presentation is most consistent with acute presentation with potential threat to life or bodily function.   MEDICATIONS ORDERED IN ED: Medications  sodium chloride flush (NS) 0.9 % injection 3 mL (3 mLs Intravenous Given 12/11/22 2255)  ondansetron (ZOFRAN) tablet 4 mg (has no administration in time range)    Or  ondansetron (ZOFRAN) injection 4 mg (has no  administration in time range)  0.9 % NaCl with KCl 20 mEq/ L  infusion (has no administration in time range)  cefTRIAXone (ROCEPHIN) 2 g in sodium chloride 0.9 % 100 mL IVPB (2 g Intravenous New Bag/Given 12/11/22 2254)  azithromycin (ZITHROMAX) 500 mg in sodium chloride 0.9 % 250 mL IVPB (has no administration in time range)  apixaban (ELIQUIS) tablet 5 mg (has no administration in time range)  insulin aspart (novoLOG) injection 0-9 Units (has no administration in time range)  insulin aspart (novoLOG) injection 0-5 Units (has  no administration in time range)  insulin aspart (novoLOG) injection 3 Units (has no administration in time range)  oxyCODONE-acetaminophen (PERCOCET/ROXICET) 5-325 MG per tablet 1 tablet (1 tablet Oral Given 12/11/22 1356)  sodium chloride 0.9 % bolus 500 mL (0 mLs Intravenous Stopped 12/11/22 1852)  potassium chloride SA (KLOR-CON M) CR tablet 40 mEq (40 mEq Oral Given 12/11/22 1717)  ondansetron (ZOFRAN) injection 4 mg (4 mg Intravenous Given 12/11/22 1717)  sodium chloride 0.9 % bolus 500 mL (0 mLs Intravenous Stopped 12/11/22 2025)  magnesium sulfate IVPB 1 g 100 mL (0 g Intravenous Stopped 12/11/22 1951)  iohexol (OMNIPAQUE) 350 MG/ML injection 75 mL (75 mLs Intravenous Contrast Given 12/11/22 1806)  morphine (PF) 4 MG/ML injection 4 mg (4 mg Intravenous Given 12/11/22 2036)    FINAL CLINICAL IMPRESSION(S) / ED DIAGNOSES   Final diagnoses:  Weakness  Fall, initial encounter  Prolonged Q-T interval on ECG  Hypokalemia     Rx / DC Orders   ED Discharge Orders     None        Note:  This document was prepared using Dragon voice recognition software and may include unintentional dictation errors.   Nathaniel Man, MD 12/11/22 2337

## 2022-12-11 NOTE — Assessment & Plan Note (Signed)
SSI A1c 

## 2022-12-11 NOTE — H&P (Signed)
History and Physical    Patient: Caroline Zamora DOB: [birth year] DOA: 12/11/2022 DOS: the patient was seen and examined on 12/11/2022 PCP: Kirk Ruths, MD  Patient coming from: Home  Chief Complaint:  Chief Complaint  Patient presents with   Weakness   HPI: Caroline Zamora is a 82 y.o. female with medical history significant of atrial fibrillation, recurring syncope, hypertension, hyperlipidemia presenting with syncope, prolonged QT and community-acquired pneumonia, fall.  Patient noted to have been admitted back in June for similar symptoms associated with syncope.  Had a fairly extensive cardiac workup with plan for abdominal binder and compression stockings.  Patient reports overall resolution of symptoms from admission.  However, has had recurring multiple falls over the past couple weeks.  Has had some transient episodes of syncope with associated weakness.  No fevers or chills.  No nausea or vomiting.  Denies any recent medication changes apart from a decrease in her metoprolol dose by outpatient cardiology per report.  Patient states she has a tendency to fall straight down with syncopal episodes.  Positive head trauma.  Has had worsening pain across the body in the setting of multiple falls.  Patient also ports worsening shortness of breath over the past 2 to 3 days.  No reported cough fevers or chills.  No reported tobacco use.  No known prior history of asthma in the past.  Denies any chest pain.  Has had some intermittent chest pressure associated with shortness of breath. Presented to the ER afebrile, hemodynamically stable.  Satting well on room air.  White count 12.2, hemoglobin 15.  Creatinine 1.33.  Troponin in the 20s.  COVID-negative.  D-dimer 2.4.  CT head stable.  CT angio negative for PE but does show right upper lobe opacity concerning for pneumonia.  Noted subacute burst type compression fracture at T12 with up to 45% height loss.Marland Kitchen  Positive chronic  compression deformities involving T2 and T 3  Review of Systems: As mentioned in the history of present illness. All other systems reviewed and are negative. Past Medical History:  Diagnosis Date   Arthritis    lumbar spine   CKD (chronic kidney disease), stage II    Colon cancer ([place])    Depression    Dysphagia    Headache    migraine   Hx of adenomatous colonic polyps    Hyperlipidemia    Hypertension    PAF (paroxysmal atrial fibrillation) (HCC)    Paroxysmal atrial fibrillation ([place])    Skin cancer    Syncope    Past Surgical History:  Procedure Laterality Date   ABDOMINAL HYSTERECTOMY     with removal of tubes and ovaries   ANKLE FRACTURE SURGERY Right    COLONOSCOPY     COLONOSCOPY WITH PROPOFOL N/A 09/03/2017   Procedure: COLONOSCOPY WITH PROPOFOL;  Surgeon: Manya Silvas, MD;  Location: [facility] ENDOSCOPY;  Service: Endoscopy;  Laterality: N/A;   COLONOSCOPY WITH PROPOFOL N/A 09/13/2020   Procedure: COLONOSCOPY WITH PROPOFOL;  Surgeon: Lesly Rubenstein, MD;  Location: [facility] ENDOSCOPY;  Service: Endoscopy;  Laterality: N/A;   DILATION AND CURETTAGE OF UTERUS     ESOPHAGOGASTRODUODENOSCOPY     ESOPHAGOGASTRODUODENOSCOPY (EGD) WITH PROPOFOL N/A 09/13/2020   Procedure: ESOPHAGOGASTRODUODENOSCOPY (EGD) WITH PROPOFOL;  Surgeon: Lesly Rubenstein, MD;  Location: [facility] ENDOSCOPY;  Service: Endoscopy;  Laterality: N/A;   LEFT HEART CATH AND CORONARY ANGIOGRAPHY N/A 04/17/2022   Procedure: LEFT HEART CATH AND CORONARY ANGIOGRAPHY;  Surgeon: Lorretta Harp, MD;  Location: [facility];  Service: Cardiovascular;  Laterality: N/A;   skin cancer removal     TONSILLECTOMY     TUBAL LIGATION     Social History:  reports that she has never smoked. She has never used smokeless tobacco. She reports that she does not currently use alcohol. She reports that she does not use drugs.  Allergies  Allergen Reactions   Desyrel [Trazodone] Rash   Sulfa Antibiotics Rash     Family History  Problem Relation Age of Onset   Diabetes Mellitus II Mother    Atrial fibrillation Father    Breast cancer Neg Hx     Prior to Admission medications   Medication Sig Start Date End Date Taking? Authorizing Provider  acetaminophen (TYLENOL) 500 MG tablet Take 500 mg by mouth every 6 (six) hours as needed for mild pain.    [provider]  amiodarone (PACERONE) 200 MG tablet Take 1 tablet (200 mg) by mouth once daily 05/10/22   Deboraha Sprang, MD  apixaban (ELIQUIS) 5 MG TABS tablet Take 5 mg by mouth 2 (two) times daily.    [provider]  carvedilol (COREG) 3.125 MG tablet Take 1 tablet (3.125 mg) by mouth  in the morning & take 2 tablets (6.25 mg) by mouth in the evening 10/24/22   Deboraha Sprang, MD  estradiol (ESTRACE) 0.5 MG tablet Take 0.5 mg by mouth at bedtime.    [provider]  hydrOXYzine (ATARAX) 25 MG tablet Take 25 mg by mouth 2 (two) times daily as needed for anxiety. 01/11/22   [provider]  venlafaxine XR (EFFEXOR-XR) 150 MG 24 hr capsule Take 150 mg by mouth daily. 01/20/22   [provider]    Physical Exam: Vitals:   12/11/22 1425 12/11/22 1635 12/11/22 2036 12/11/22 2052  BP: 122/79 (!) 170/72 (!) 164/68   Pulse: 95 72 88   Resp:  16 (!) 22   Temp:  98.1 F (36.7 C)  97.7 F (36.5 C)  TempSrc:    Oral  SpO2: 100% 98% 100%   Weight:      Height:       Physical Exam Constitutional:      General: She is not in acute distress.    Appearance: She is obese.  HENT:     Head: Normocephalic and atraumatic.     Nose: Nose normal.  Eyes:     Pupils: Pupils are equal, round, and reactive to light.  Cardiovascular:     Rate and Rhythm: Normal rate. Rhythm irregular.  Pulmonary:     Effort: Pulmonary effort is normal.     Breath sounds: Normal breath sounds.  Abdominal:     General: Bowel sounds are normal.  Musculoskeletal:     Comments: + generalized weakness s/p fall    Skin:    General:  Skin is warm.  Neurological:     General: No focal deficit present.  Psychiatric:        Mood and Affect: Mood normal.     Data Reviewed:There are no new results to review at this time.  Assessment and Plan: Recurrent syncope Recurring issue with noted prior admission June 2023 with extensive cardiac workup Suspect possible decompensation in setting of worsening SOB over past 1-2 days w/ RUL PNA on imaging  Gentle IVF hydration  Will place on CAP treatment  EKG fairly stable apart from mild increase in QT prolongation- hold offending agents  No reported recent medication changes  apart from decrease on BB by cardiology in the outpatient setting  Borderline elevated trop w/ minimal chest pressure  Telemetry monitoring overnight  Plan for formal cardiology consult in am     CAP (community acquired pneumonia) Worsening SOB and decompensated syncope w/ noted RUL infiltrate on imaging  WBC 12 No hypoxia Given symptoms and overall presentation, will place on IV rocephin and azithromycin for infectious coverage  Supplemental O2 prn  Follow     Fall Recurring issue in the setting of recurrent syncope and collapse at home Imaging stable apart from noted subacute T12 compression fracture Fall precautions Monitor for now PT OT evaluation  Type 2 diabetes mellitus (HCC) SSI A1c  Prolonged QT interval QTc 530s on presentation w/ acute on chronic syncope  Titrate home regimen pending  re-evaluation by cardiology  Follow closely   Elevated troponin Trop in 20s on presentation  No active CP Suspect minimal to mild heart strain  Cont home regimen  Prn NTG  Plan for formal cardiology consult in am  Follow   HFrEF (heart failure with reduced ejection fraction) (HCC) Euvolemic  Cont home regimen  Strict Is and Os and daily weights  Follow  A-fib ([place]) Heart rate relatively stable  Titrate home regimen in setting of QT changes. Eliquis  Telemetry monitoring  Follow  closely      Advance Care Planning:   Code Status: Full Code   Consults: None at present. Plan for cardiology consult in am.   Family Communication: No family at the bedside   Severity of Illness: The appropriate patient status for this patient is OBSERVATION. Observation status is judged to be reasonable and necessary in order to provide the required intensity of service to ensure the patient's safety. The patient's presenting symptoms, physical exam findings, and initial radiographic and laboratory data in the context of their medical condition is felt to place them at decreased risk for further clinical deterioration. Furthermore, it is anticipated that the patient will be medically stable for discharge from the hospital within 2 midnights of admission.   Author: Deneise Lever, MD 12/11/2022 11:08 PM  For on call review [email or web address].

## 2022-12-12 ENCOUNTER — Observation Stay (HOSPITAL_COMMUNITY)
Admit: 2022-12-12 | Discharge: 2022-12-12 | Disposition: A | Discharge status: Home / Self Care | Payer: Medicare HMO | Attending: Family Medicine | Admitting: Family Medicine

## 2022-12-12 ENCOUNTER — Other Ambulatory Visit: Payer: Self-pay

## 2022-12-12 ENCOUNTER — Encounter: Payer: Self-pay | Admitting: Student in an Organized Health Care Education/Training Program

## 2022-12-12 DIAGNOSIS — I482 Chronic atrial fibrillation, unspecified: Secondary | ICD-10-CM | POA: Diagnosis present

## 2022-12-12 DIAGNOSIS — R531 Weakness: Secondary | ICD-10-CM | POA: Diagnosis not present

## 2022-12-12 DIAGNOSIS — W19XXXA Unspecified fall, initial encounter: Secondary | ICD-10-CM | POA: Diagnosis not present

## 2022-12-12 DIAGNOSIS — E861 Hypovolemia: Secondary | ICD-10-CM | POA: Diagnosis present

## 2022-12-12 DIAGNOSIS — K760 Fatty (change of) liver, not elsewhere classified: Secondary | ICD-10-CM | POA: Diagnosis present

## 2022-12-12 DIAGNOSIS — R55 Syncope and collapse: Secondary | ICD-10-CM | POA: Diagnosis not present

## 2022-12-12 DIAGNOSIS — I480 Paroxysmal atrial fibrillation: Secondary | ICD-10-CM | POA: Diagnosis present

## 2022-12-12 DIAGNOSIS — I4891 Unspecified atrial fibrillation: Secondary | ICD-10-CM | POA: Diagnosis not present

## 2022-12-12 DIAGNOSIS — I951 Orthostatic hypotension: Secondary | ICD-10-CM | POA: Diagnosis not present

## 2022-12-12 DIAGNOSIS — N182 Chronic kidney disease, stage 2 (mild): Secondary | ICD-10-CM | POA: Diagnosis present

## 2022-12-12 DIAGNOSIS — Z79899 Other long term (current) drug therapy: Secondary | ICD-10-CM | POA: Diagnosis not present

## 2022-12-12 DIAGNOSIS — E876 Hypokalemia: Secondary | ICD-10-CM

## 2022-12-12 DIAGNOSIS — Z90710 Acquired absence of both cervix and uterus: Secondary | ICD-10-CM | POA: Diagnosis not present

## 2022-12-12 DIAGNOSIS — Z7901 Long term (current) use of anticoagulants: Secondary | ICD-10-CM | POA: Diagnosis not present

## 2022-12-12 DIAGNOSIS — Z85828 Personal history of other malignant neoplasm of skin: Secondary | ICD-10-CM | POA: Diagnosis not present

## 2022-12-12 DIAGNOSIS — R296 Repeated falls: Secondary | ICD-10-CM | POA: Diagnosis present

## 2022-12-12 DIAGNOSIS — Z85038 Personal history of other malignant neoplasm of large intestine: Secondary | ICD-10-CM | POA: Diagnosis not present

## 2022-12-12 DIAGNOSIS — Z1152 Encounter for screening for COVID-19: Secondary | ICD-10-CM | POA: Diagnosis not present

## 2022-12-12 DIAGNOSIS — I130 Hypertensive heart and chronic kidney disease with heart failure and stage 1 through stage 4 chronic kidney disease, or unspecified chronic kidney disease: Secondary | ICD-10-CM | POA: Diagnosis present

## 2022-12-12 DIAGNOSIS — I4892 Unspecified atrial flutter: Secondary | ICD-10-CM | POA: Diagnosis present

## 2022-12-12 DIAGNOSIS — E860 Dehydration: Secondary | ICD-10-CM | POA: Diagnosis present

## 2022-12-12 DIAGNOSIS — E1122 Type 2 diabetes mellitus with diabetic chronic kidney disease: Secondary | ICD-10-CM | POA: Diagnosis present

## 2022-12-12 DIAGNOSIS — I428 Other cardiomyopathies: Secondary | ICD-10-CM | POA: Diagnosis present

## 2022-12-12 DIAGNOSIS — N179 Acute kidney failure, unspecified: Secondary | ICD-10-CM | POA: Diagnosis present

## 2022-12-12 DIAGNOSIS — I5020 Unspecified systolic (congestive) heart failure: Secondary | ICD-10-CM | POA: Diagnosis present

## 2022-12-12 DIAGNOSIS — R9431 Abnormal electrocardiogram [ECG] [EKG]: Secondary | ICD-10-CM | POA: Diagnosis not present

## 2022-12-12 DIAGNOSIS — I2510 Atherosclerotic heart disease of native coronary artery without angina pectoris: Secondary | ICD-10-CM | POA: Diagnosis present

## 2022-12-12 DIAGNOSIS — F411 Generalized anxiety disorder: Secondary | ICD-10-CM | POA: Diagnosis present

## 2022-12-12 DIAGNOSIS — J189 Pneumonia, unspecified organism: Secondary | ICD-10-CM | POA: Diagnosis present

## 2022-12-12 DIAGNOSIS — E785 Hyperlipidemia, unspecified: Secondary | ICD-10-CM | POA: Diagnosis present

## 2022-12-12 LAB — COMPREHENSIVE METABOLIC PANEL
ALT: 22 U/L (ref 0–44)
AST: 22 U/L (ref 15–41)
Albumin: 2.9 g/dL — ABNORMAL LOW (ref 3.5–5.0)
Alkaline Phosphatase: 64 U/L (ref 38–126)
Anion gap: 11 (ref 5–15)
BUN: 27 mg/dL — ABNORMAL HIGH (ref 8–23)
CO2: 22 mmol/L (ref 22–32)
Calcium: 7.6 mg/dL — ABNORMAL LOW (ref 8.9–10.3)
Chloride: 100 mmol/L (ref 98–111)
Creatinine, Ser: 1.89 mg/dL — ABNORMAL HIGH (ref 0.44–1.00)
GFR, Estimated: 27 mL/min — ABNORMAL LOW (ref >60)
Glucose, Bld: 99 mg/dL (ref 70–99)
Potassium: 3.9 mmol/L (ref 3.5–5.1)
Sodium: 133 mmol/L — ABNORMAL LOW (ref 135–145)
Total Bilirubin: 0.9 mg/dL (ref 0.3–1.2)
Total Protein: 6.2 g/dL — ABNORMAL LOW (ref 6.5–8.1)

## 2022-12-12 LAB — ECHOCARDIOGRAM COMPLETE
AR max vel: 2.35 cm2
AV Area VTI: 2.5 cm2
AV Area mean vel: 2.41 cm2
AV Mean grad: 5 mmHg
AV Peak grad: 10.6 mmHg
Ao pk vel: 1.63 m/s
Area-P 1/2: 2.77 cm2
Height: 66 in
MV VTI: 3.69 cm2
S' Lateral: 2.6 cm
Weight: 2880 oz

## 2022-12-12 LAB — CBC WITH DIFFERENTIAL/PLATELET
Abs Immature Granulocytes: 0.06 10*3/uL (ref 0.00–0.07)
Basophils Absolute: 0 10*3/uL (ref 0.0–0.1)
Basophils Relative: 0 %
Eosinophils Absolute: 0.1 10*3/uL (ref 0.0–0.5)
Eosinophils Relative: 1 %
HCT: 37.5 % (ref 36.0–46.0)
Hemoglobin: 12.1 g/dL (ref 12.0–15.0)
Immature Granulocytes: 1 %
Lymphocytes Relative: 25 %
Lymphs Abs: 2.4 10*3/uL (ref 0.7–4.0)
MCH: 30.7 pg (ref 26.0–34.0)
MCHC: 32.3 g/dL (ref 30.0–36.0)
MCV: 95.2 fL (ref 80.0–100.0)
Monocytes Absolute: 1.2 10*3/uL — ABNORMAL HIGH (ref 0.1–1.0)
Monocytes Relative: 13 %
Neutro Abs: 5.9 10*3/uL (ref 1.7–7.7)
Neutrophils Relative %: 60 %
Platelets: 241 10*3/uL (ref 150–400)
RBC: 3.94 MIL/uL (ref 3.87–5.11)
RDW: 15.8 % — ABNORMAL HIGH (ref 11.5–15.5)
WBC: 9.7 10*3/uL (ref 4.0–10.5)
nRBC: 0 % (ref 0.0–0.2)

## 2022-12-12 LAB — GLUCOSE, CAPILLARY
Glucose-Capillary: 78 mg/dL (ref 70–99)
Glucose-Capillary: 123 mg/dL — ABNORMAL HIGH (ref 70–99)
Glucose-Capillary: 101 mg/dL — ABNORMAL HIGH (ref 70–99)

## 2022-12-12 LAB — URINALYSIS, ROUTINE W REFLEX MICROSCOPIC
Bacteria, UA: NONE SEEN
Bilirubin Urine: NEGATIVE
Glucose, UA: NEGATIVE mg/dL
Ketones, ur: 5 mg/dL — AB
Leukocytes,Ua: NEGATIVE
Nitrite: NEGATIVE
Protein, ur: 30 mg/dL — AB
Specific Gravity, Urine: 1.035 — ABNORMAL HIGH (ref 1.005–1.030)
pH: 5 (ref 5.0–8.0)

## 2022-12-12 LAB — HIV ANTIBODY (ROUTINE TESTING W REFLEX): HIV Screen 4th Generation wRfx: NONREACTIVE

## 2022-12-12 LAB — TROPONIN I (HIGH SENSITIVITY): Troponin I (High Sensitivity): 19 ng/L — ABNORMAL HIGH (ref <18)

## 2022-12-12 MED ORDER — CARVEDILOL 6.25 MG PO TABS
6.2500 mg | ORAL_TABLET | Freq: Two times a day (BID) | ORAL | Status: DC
  Administered 2022-12-12: 6.25 mg via ORAL
  Filled 2022-12-12: qty 1

## 2022-12-12 MED ORDER — AMIODARONE HCL 200 MG PO TABS
200.0000 mg | ORAL_TABLET | Freq: Every day | ORAL | Status: DC
  Administered 2022-12-12: 200 mg via ORAL
  Filled 2022-12-12: qty 1

## 2022-12-12 MED ORDER — CARVEDILOL 6.25 MG PO TABS
3.1250 mg | ORAL_TABLET | Freq: Two times a day (BID) | ORAL | Status: DC
  Administered 2022-12-12 – 2022-12-14 (×4): 3.125 mg via ORAL
  Filled 2022-12-12 (×4): qty 1

## 2022-12-12 MED ORDER — ESTRADIOL 0.5 MG PO TABS
0.5000 mg | ORAL_TABLET | Freq: Every day | ORAL | Status: DC
  Administered 2022-12-12 – 2022-12-13 (×2): 0.5 mg via ORAL
  Filled 2022-12-12 (×2): qty 1

## 2022-12-12 MED ORDER — AMIODARONE HCL 200 MG PO TABS
200.0000 mg | ORAL_TABLET | Freq: Once | ORAL | Status: AC
  Administered 2022-12-12: 200 mg via ORAL
  Filled 2022-12-12: qty 1

## 2022-12-12 MED ORDER — HYDROXYZINE HCL 25 MG PO TABS
25.0000 mg | ORAL_TABLET | Freq: Two times a day (BID) | ORAL | Status: DC | PRN
  Administered 2022-12-13 (×2): 25 mg via ORAL
  Filled 2022-12-12 (×2): qty 1

## 2022-12-12 MED ORDER — SODIUM CHLORIDE 0.9 % IV SOLN
100.0000 mg | Freq: Two times a day (BID) | INTRAVENOUS | Status: DC
  Administered 2022-12-12 – 2022-12-14 (×4): 100 mg via INTRAVENOUS
  Filled 2022-12-12 (×5): qty 100

## 2022-12-12 MED ORDER — VENLAFAXINE HCL ER 75 MG PO CP24
150.0000 mg | ORAL_CAPSULE | Freq: Every day | ORAL | Status: DC
  Administered 2022-12-12 – 2022-12-14 (×3): 150 mg via ORAL
  Filled 2022-12-12 (×3): qty 2

## 2022-12-12 NOTE — Assessment & Plan Note (Signed)
Worsening SOB and decompensated syncope w/ noted RUL infiltrate on imaging  WBC 12 No hypoxia Given symptoms and overall presentation, will place on IV rocephin and azithromycin for infectious coverage  Supplemental O2 prn  Follow

## 2022-12-12 NOTE — Assessment & Plan Note (Addendum)
Heart rate relatively stable  Titrate home regimen in setting of QT changes. Eliquis  Telemetry monitoring  Follow closely

## 2022-12-12 NOTE — ED Notes (Addendum)
patient has not urinated this shift. Asked patient  when was the last time she urinated was and she said yesterday. her bladder felt very distended and she was complaining of spasms in her lower abdomen and pelvic area. Intermittent cath  performed with Stormy Fabian, RN. 570m urine output. Patient also noted to become tachycardic at 0305. EKG performed and foust NP made aware of both changes.

## 2022-12-12 NOTE — Progress Notes (Signed)
       CROSS COVER NOTE  NAME: Caroline Zamora MRN: [id number] DOB : [birth year] ATTENDING PHYSICIAN: Deneise Lever, MD    Date of Service   12/12/2022   HPI/Events of Note   Notified of tachycardia, RN got an EKG that showed Atrial flutter HR 129, qTC 483.  Per patient she missed her dose of Amiodarone yesterday morning.  Interventions   Assessment/Plan:  Amiodarone 200 mg PO x1      To reach the provider On-Call:   7AM- 7PM see care teams to locate the attending and reach out to them via [email or web address]. 7PM-7AM contact night-coverage If you still have difficulty reaching the appropriate provider, please page the [facility] (Director on Call) for [facility] on amion for assistance  This document was prepared using Set designer software and may include unintentional dictation errors.  Neomia Glass DNP, MBA, FNP-BC Nurse Practitioner [facility] Pager [phone]

## 2022-12-12 NOTE — Consult Note (Signed)
Cardiology Consultation:   Patient ID: Caroline Zamora; [id number]; 06/13/1941   Admit date: 12/11/2022 Date of Consult: 12/12/2022  Primary Care Provider: Kirk Ruths, MD Primary Cardiologist: Paraschos Primary Electrophysiologist:  Caryl Comes   Patient Profile:   Caroline Zamora is a 82 y.o. female with a hx of FrEF with subsequent normalization of LV systolic function by echo this admission, PAF, syncope felt to be orthostatic in etiology during admission in 04/2022, HTN, and HLD who is being seen today for the evaluation of syncope at the request of Dr. Ouida Sills.  History of Present Illness:   [name] was previously followed by Dr. Saralyn Pilar with [facility] cardiology.  She was recently evaluated by Dr. Caryl Comes from an EP perspective for syncope.   Echo from 2018 showed an EF of 50%, normal wall motion, mild LVH, mildly enlarged left atrium, and mild to moderate mitral regurgitation.   Echo in 07/2019 showed an EF of 55 to 60%, normal LV diastolic function parameters, normal RV systolic function and ventricular cavity size, mild biatrial enlargement.   Lexiscan MPI in 12/2021 showed no evidence of ischemia with normal wall motion and LV systolic function.  Echo in 12/2021 demonstrated an EF of 35%, global hypokinesis, grade 1 diastolic dysfunction, and moderate mitral regurgitation.   She was admitted to the hospital in 04/2022 with syncope and found to have orthostatic hypotension.  Echo in 04/2022 demonstrated an EF of 35 to 40%, global hypokinesis, grade 1 diastolic dysfunction, normal RV systolic function and ventricular cavity size, mildly dilated left atrium, mild mitral regurgitation, and an estimated right atrial pressure of 3 mmHg.   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  At the beginning of the case, she was in sinus rhythm though developed A-fib/flutter with RVR at the end of the case raising question  of syncope being arrhythmogenic in etiology.  Cardiac MRI in 04/2022 showed normal LV size with mild systolic dysfunction with an EF of 42%, normal RV size and systolic function, and no LGE.  Outpatient cardiac monitoring in 04/2022 showed a predominant rhythm of sinus with known A-fib with initial monitor showing a 2% burden and repeat monitoring showing a 21% burden.  Bundle branch block/IVCD was present.  No evidence of high-grade AV block or prolonged pauses/posttermination pauses.   She was most recently evaluated by EP in 10/2022 without further syncope or orthostatic symptoms.  Permissive hypertension was recommended.   She was admitted to [facility] on 12/11/2022 with a 1 day history of weakness, dyspnea, and falls.  No frank syncope or presyncope.  No chest pain, palpitations, dizziness, lower extremity swelling, abdominal distention, or orthopnea.  Hemodynamically stable and afebrile upon presentation.  Initial and peak high-sensitivity troponin 22 with a delta of 20.  D-dimer elevated at 2.4.  Sodium 133, potassium 3.1, BUN 17, serum creatinine 1.33.  WBC 12.2.  Hgb 14.7.  Magnesium 2.1.  CT head showed no acute intracranial findings.  CT cervical spine showed no recent fracture.  CTA chest was negative for PE or aortic dissection with heterogeneous groundglass density noted in the right upper lobe suspicious for pneumonia as well as a possible acute to subacute burst type compression fracture of T12.  Echo this admission demonstrated an EF of 55 to 60%, no regional wall motion rise, mild LVH, grade 1 diastolic dysfunction, normal RV systolic function and ventricular cavity size, and trivial mitral regurgitation.  She reports she  has been doing well from a cardiac and syncope perspective.  While walking up the stairs the night before presentation, she noticed she was quite weak and short of breath.  Upon getting to the steps she fell.  No frank syncope.  She was able to get to her bed and went to sleep.  Upon  waking up on the morning of 1/8, she continued to note some shortness of breath and generalized weakness without frank angina, presyncope, or syncope.  Given persistent symptoms, she presented to [facility] ED with workup as outlined above.  Currently being treated for dehydration, electrolyte abnormalities, and pneumonia.  Orthostatic vital signs remain positive this afternoon.  Currently without symptoms of angina or decompensation.  She reports drinking 4 to 5 cups of water per day.         Past Medical History:  Diagnosis Date   Arthritis    lumbar spine   CKD (chronic kidney disease), stage II    Colon cancer ([place])    Depression    Dysphagia    Headache    migraine   Hx of adenomatous colonic polyps    Hyperlipidemia    Hypertension    PAF (paroxysmal atrial fibrillation) (HCC)    Paroxysmal atrial fibrillation (HCC)    Skin cancer    Syncope    Type 2 diabetes mellitus ([place]) 12/11/2022    Past Surgical History:  Procedure Laterality Date   ABDOMINAL HYSTERECTOMY     with removal of tubes and ovaries   ANKLE FRACTURE SURGERY Right    COLONOSCOPY     COLONOSCOPY WITH PROPOFOL N/A 09/03/2017   Procedure: COLONOSCOPY WITH PROPOFOL;  Surgeon: Manya Silvas, MD;  Location: [facility] ENDOSCOPY;  Service: Endoscopy;  Laterality: N/A;   COLONOSCOPY WITH PROPOFOL N/A 09/13/2020   Procedure: COLONOSCOPY WITH PROPOFOL;  Surgeon: Lesly Rubenstein, MD;  Location: [facility] ENDOSCOPY;  Service: Endoscopy;  Laterality: N/A;   DILATION AND CURETTAGE OF UTERUS     ESOPHAGOGASTRODUODENOSCOPY     ESOPHAGOGASTRODUODENOSCOPY (EGD) WITH PROPOFOL N/A 09/13/2020   Procedure: ESOPHAGOGASTRODUODENOSCOPY (EGD) WITH PROPOFOL;  Surgeon: Lesly Rubenstein, MD;  Location: [facility] ENDOSCOPY;  Service: Endoscopy;  Laterality: N/A;   LEFT HEART CATH AND CORONARY ANGIOGRAPHY N/A 04/17/2022   Procedure: LEFT HEART CATH AND CORONARY ANGIOGRAPHY;  Surgeon: Lorretta Harp, MD;  Location: [facility] CV LAB;   Service: Cardiovascular;  Laterality: N/A;   skin cancer removal     TONSILLECTOMY     TUBAL LIGATION       Home Meds: Prior to Admission medications   Medication Sig Start Date End Date Taking? Authorizing Provider  acetaminophen (TYLENOL) 500 MG tablet Take 500 mg by mouth every 6 (six) hours as needed for mild pain.    [provider]  amiodarone (PACERONE) 200 MG tablet Take 1 tablet (200 mg) by mouth once daily 05/10/22   Deboraha Sprang, MD  apixaban (ELIQUIS) 5 MG TABS tablet Take 5 mg by mouth 2 (two) times daily.    [provider]  carvedilol (COREG) 3.125 MG tablet Take 1 tablet (3.125 mg) by mouth  in the morning & take 2 tablets (6.25 mg) by mouth in the evening 10/24/22   Deboraha Sprang, MD  estradiol (ESTRACE) 0.5 MG tablet Take 0.5 mg by mouth at bedtime.    [provider]  hydrOXYzine (ATARAX) 25 MG tablet Take 25 mg by mouth 2 (two) times daily as needed for anxiety. 01/11/22   [provider]  venlafaxine XR (EFFEXOR-XR) 150 MG 24 hr capsule Take 150 mg by mouth daily. 01/20/22   [provider]    Inpatient Medications: Scheduled Meds:  amiodarone  200 mg Oral Daily   apixaban  5 mg Oral BID   carvedilol  6.25 mg Oral BID WC   estradiol  0.5 mg Oral QHS   insulin aspart  0-9 Units Subcutaneous TID WC   sodium chloride flush  3 mL Intravenous Q12H   venlafaxine XR  150 mg Oral Daily   Continuous Infusions:  0.9 % NaCl with KCl 20 mEq / L 75 mL/hr at 12/12/22 0900   cefTRIAXone (ROCEPHIN)  IV Stopped (12/11/22 2324)   doxycycline (VIBRAMYCIN) IV     PRN Meds: hydrOXYzine, ondansetron **OR** ondansetron (ZOFRAN) IV  Allergies:   Allergies  Allergen Reactions   Desyrel [Trazodone] Rash   Sulfa Antibiotics Rash    Social History:   Social History   Socioeconomic History   Marital status: Widowed    Spouse name: Not on file   Number of children: Not on file   Years of education: Not on file   Highest education  level: Not on file  Occupational History   Not on file  Tobacco Use   Smoking status: Never   Smokeless tobacco: Never  Vaping Use   Vaping Use: Never used  Substance and Sexual Activity   Alcohol use: Not Currently    Comment: a couple of glasses of wine per day   Drug use: No   Sexual activity: Not on file  Other Topics Concern   Not on file  Social History Narrative   Not on file   Social Determinants of Health   Financial Resource Strain: Not on file  Food Insecurity: Not on file  Transportation Needs: Not on file  Physical Activity: Not on file  Stress: Not on file  Social Connections: Not on file  Intimate Partner Violence: Not on file     Family History:   Family History  Problem Relation Age of Onset   Diabetes Mellitus II Mother    Atrial fibrillation Father    Breast cancer Neg Hx     ROS:  Review of Systems  Constitutional:  Positive for malaise/fatigue. Negative for chills, diaphoresis, fever and weight loss.  HENT:  Negative for congestion.   Eyes:  Negative for discharge and redness.  Respiratory:  Positive for shortness of breath. Negative for cough, sputum production and wheezing.   Cardiovascular:  Negative for chest pain, palpitations, orthopnea, claudication, leg swelling and PND.  Gastrointestinal:  Negative for abdominal pain, blood in stool, heartburn, melena, nausea and vomiting.  Musculoskeletal:  Positive for falls. Negative for myalgias.  Skin:  Negative for rash.  Neurological:  Positive for weakness. Negative for dizziness, tingling, tremors, sensory change, speech change, focal weakness and loss of consciousness.  Endo/Heme/Allergies:  Does not bruise/bleed easily.  Psychiatric/Behavioral:  Negative for substance abuse. The patient is not nervous/anxious.   All other systems reviewed and are negative.     Physical Exam/Data:   Vitals:   12/12/22 0545 12/12/22 0600 12/12/22 0630 12/12/22 0733  BP:  120/60 118/66 (!) 141/67  Pulse:  83 77 76 76  Resp: 18 16 (!) 21 16  Temp:    98 F (36.7 C)  TempSrc:    Oral  SpO2: 96% 96% 98% 98%  Weight:      Height:        Intake/Output Summary (Last 24 hours) at 12/12/2022  [place] filed at 12/12/2022 0403 Gross per 24 hour  Intake 950 ml  Output 500 ml  Net 450 ml   Filed Weights   12/11/22 0919  Weight: 81.6 kg   Body mass index is 29.05 kg/m.   Physical Exam: General: Well developed, well nourished, in no acute distress. Head: Normocephalic, atraumatic, sclera non-icteric, no xanthomas, nares without discharge.  Neck: Negative for carotid bruits. JVD not elevated. Lungs: Diminished breath sounds bilaterally with faint expiratory wheezing. Breathing is unlabored. Heart: RRR with S1 S2. No murmurs, rubs, or gallops appreciated. Abdomen: Soft, non-tender, non-distended with normoactive bowel sounds. No hepatomegaly. No rebound/guarding. No obvious abdominal masses. Msk:  Strength and tone appear normal for age. Extremities: No clubbing or cyanosis. No edema. Distal pedal pulses are 2+ and equal bilaterally. Neuro: Alert and oriented X 3. No facial asymmetry. No focal deficit. Moves all extremities spontaneously. Psych:  Responds to questions appropriately with a normal affect.   EKG:  The EKG was personally reviewed and demonstrates: 12/11/2022 -sinus tachycardia, 1 to 3 bpm, RBBB, rare PVC, nonspecific ST-T changes.  EKG on 12/12/2022 at 3:30 AM shows atrial flutter with 2-1 AV block, 129 bpm, RBBB, nonspecific CT changes Telemetry:  Telemetry was personally reviewed and demonstrates: Sinus rhythm with isolated PVCs  Weights: Filed Weights   12/11/22 0919  Weight: 81.6 kg    Relevant CV Studies:  2D echo 12/12/2022: 1. Left ventricular ejection fraction, by estimation, is 55 to 60%. The  left ventricle has normal function. The left ventricle has no regional  wall motion abnormalities. There is mild left ventricular hypertrophy.  Left ventricular diastolic  parameters  are consistent with Grade I diastolic dysfunction (impaired relaxation).   2. Right ventricular systolic function is normal. The right ventricular  size is normal.   3. The mitral valve is normal in structure. Trivial mitral valve  regurgitation. No evidence of mitral stenosis.   4. The aortic valve is tricuspid. Aortic valve regurgitation is not  visualized. No aortic stenosis is present.  __________  Elwyn Reach patch 04/2022: Findings HR  avg 76-82  Min 56-62-Max 132-200      Patch Wear Time:  14 days and 22 hours (2023-05-17T12:01:29-0400 to 2023-06-06T17:02:06-399)   Monitor 1 Patient had a min HR of 62 bpm, max HR of 132 bpm, and avg HR of 76 bpm. Predominant underlying rhythm was Sinus Rhythm. Bundle Branch Block/IVCD was present. Atrial Fibrillation occurred (2% burden), ranging from 90-132 bpm (avg of 108 bpm), the longest  lasting 31 mins 8 secs with an avg rate of 108 bpm. Isolated SVEs were rare (<1.0%), SVE Couplets were rare (<1.0%), and SVE Triplets were rare (<1.0%). Isolated VEs were occasional (4.5%, 2506), VE Couplets were rare (<1.0%, 66), and no VE Triplets  were present. Ventricular Bigeminy was present.   Monitor 2 Patient had a min HR of 56 bpm, max HR of 200 bpm, and avg HR of 82 bpm. Predominant underlying rhythm was Sinus Rhythm. Bundle Branch Block/IVCD was present. 10 Supraventricular Tachycardia runs occurred, the run with the fastest interval lasting 11.3  secs with a max rate of 174 bpm (avg 145 bpm); the run with the fastest interval was also the longest. Atrial Fibrillation/Flutter occurred (21% burden), ranging from 73-200 bpm (avg of 112 bpm), the longest lasting 1 day 7 hours with an avg rate of 110  bpm. Isolated SVEs were rare (<1.0%), SVE Couplets were rare (<1.0%), and SVE Triplets were rare (<1.0%). Isolated VEs were rare (<1.0%),  VE Couplets were rare (<1.0%), and no VE Triplets were present. Ventricular Bigeminy was present. Previoulsy   notified -- this is a replacement device __________  cMRI 04/18/2022: IMPRESSION: 1.  Normal LV size with mild systolic dysfunction (EF 27%) 2.  Normal RV size and systolic function (EF 25%) 3.  No late gadolinium enhancement to suggest myocardial scar __________  [facility] 04/17/2022: IMPRESSION: Ms. Waldrip has nonobstructive CAD with at most a 30% proximal dominant RCA stenosis.  She has a low LVEDP of 4.  She was in sinus rhythm at the beginning of the case and went into a drip/flutter with RVR at the end of the case suggesting that her syncope may be arrhythmogenic in nature.  I have communicated these results to Dr. Lars Mage, electrophysiology, who arranged the heart cath.  The sheath was removed and a TR band was placed on the right wrist to achieve patent hemostasis.  The patient left lab in stable condition.  Heparin will be started 4 hours after sheath removal.  __________  2D echo 04/10/2022: 1. Left ventricular ejection fraction, by estimation, is 35 to 40%. The  left ventricle has mild to moderately decreased function. The left  ventricle demonstrates global hypokinesis. Left ventricular diastolic  parameters are consistent with Grade I  diastolic dysfunction (impaired relaxation).   2. Right ventricular systolic function is normal. The right ventricular  size is normal.   3. Left atrial size was mildly dilated.   4. The mitral valve is normal in structure. Mild mitral valve  regurgitation.   5. The aortic valve is tricuspid. Aortic valve regurgitation is not  visualized.   6. The inferior vena cava is normal in size with greater than 50%  respiratory variability, suggesting right atrial pressure of 3 mmHg.   Comparison(s): Echo performed at [facility]: LVEF 35%, Mod MR.  __________  Carlton Adam MPI 12/14/2021: 1.  Normal left ventricular function  2.  Normal wall motion  3.  No evidence for scar or ischemia  __________  2D echo 12/06/2021 Jefm Bryant): MODERATE LV  SYSTOLIC DYSFUNCTION (See above)  NORMAL RIGHT VENTRICULAR SYSTOLIC FUNCTION  NO VALVULAR STENOSIS  MODERATE MR  TRIVIAL TR  MILD PR  EF 35%  __________  2D echo 12/27/2016 Jefm Bryant): NORMAL LEFT VENTRICULAR SYSTOLIC FUNCTION   WITH MILD LVH  NORMAL RIGHT VENTRICULAR SYSTOLIC FUNCTION  MILD VALVULAR REGURGITATION (See above)  NO VALVULAR STENOSIS  MILD to MODERATE MR  EF 50%    Laboratory Data:  Chemistry Recent Labs  Lab 12/11/22 0923 12/12/22 0338  NA 133* 133*  K 3.1* 3.9  CL 97* 100  CO2 22 22  GLUCOSE 188* 99  BUN 17 27*  CREATININE 1.33* 1.89*  CALCIUM 8.2* 7.6*  GFRNONAA 41* 27*  ANIONGAP 14 11    Recent Labs  Lab 12/12/22 0338  PROT 6.2*  ALBUMIN 2.9*  AST 22  ALT 22  ALKPHOS 64  BILITOT 0.9   Hematology Recent Labs  Lab 12/11/22 0923 12/12/22 0338  WBC 12.2* 9.7  RBC 4.81 3.94  HGB 14.7 12.1  HCT 45.0 37.5  MCV 93.6 95.2  MCH 30.6 30.7  MCHC 32.7 32.3  RDW 15.4 15.8*  PLT 339 241   Cardiac EnzymesNo results for input(s): "TROPONINI" in the last 168 hours. No results for input(s): "TROPIPOC" in the last 168 hours.  BNPNo results for input(s): "BNP", "PROBNP" in the last 168 hours.  DDimer  Recent Labs  Lab 12/11/22 1713  DDIMER 2.40*    Radiology/Studies:  CT Lumbar Spine Wo Contrast  Result Date: 12/11/2022 IMPRESSION: 1. Acute burst type compression fracture of T12, described on corresponding thoracic spine CT. 2. No other acute traumatic injury within the lumbar spine. 3. Subacute healing fractures of the right posterior tenth and eleventh ribs. 4. Moderate multilevel degenerative spondylosis and facet arthrosis as above, resulting in mild-to-moderate spinal stenosis at L2-3 through L4-5. 5. Hepatic steatosis. Aortic Atherosclerosis (ICD10-I70.0). Electronically Signed   By: Jeannine Boga M.D.   On: 12/11/2022 20:42   CT Thoracic Spine Wo Contrast  Result Date: 12/11/2022 IMPRESSION: 1. Acute burst type compression  fracture involving the T12 vertebral body with up to 45% height loss and 6 mm bony retropulsion. 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 2. Additional subacute fractures of the right posterior ninth, tenth, and eleventh ribs. 3. Mild chronic compression deformities involving the superior endplates of T2 and T3 without bony retropulsion. 4. 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 5. Hepatic steatosis. Aortic Atherosclerosis (ICD10-I70.0). Electronically Signed   By: Jeannine Boga M.D.   On: 12/11/2022 20:35   CT Angio Chest Pulmonary Embolism (PE) W or WO Contrast  Result Date: 12/11/2022 IMPRESSION: 1. Negative for acute pulmonary embolus or aortic dissection. 2. Heterogeneous ground-glass density in the right upper lobe, suspicious for pneumonia. 3. Partially visualized acute to subacute burst type compression fracture of T12 with about 7 mm retropulsion of posterior vertebral body fracture fragment into the spinal canal. There is mass effect on and flattening of the anterior thecal sac. Critical Value/emergent results were called by telephone at the time of interpretation on 12/11/2022 at 6:36 pm to provider [facility] , who verbally acknowledged these results. Aortic Atherosclerosis (ICD10-I70.0). Electronically Signed   By: Donavan Foil M.D.   On: 12/11/2022 18:36   DG Thoracic Spine 2 View  Result Date: 12/11/2022 IMPRESSION: Moderate T12 compression deformity is noted most consistent with old fracture. However, if patient is symptomatic in this area, MRI may be performed to evaluate for possible acute fracture. No other definite abnormality is seen. Electronically Signed   By: Marijo Conception M.D.   On: 12/11/2022 17:07   CT Cervical Spine Wo Contrast  Result Date: 12/11/2022 IMPRESSION: No recent fracture is seen in cervical spine. 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 No significant interval changes are noted. Electronically Signed    By: Elmer Picker M.D.   On: 12/11/2022 10:43   CT HEAD WO CONTRAST (5MM)  Result Date: 12/11/2022 IMPRESSION: No acute intracranial findings are seen in noncontrast CT brain. Atrophy. Small-vessel disease. Electronically Signed   By: Elmer Picker M.D.   On: 12/11/2022 10:38   DG Hip Unilat W or Wo Pelvis 2-3 Views Left  Result Date: 12/11/2022 IMPRESSION: 1. No acute fracture or dislocation. 2. Marked soft tissue swelling of the left gluteal region. Electronically Signed   By: Keane Police D.O.   On: 12/11/2022 10:22    Assessment and Plan:   1.  History of presyncope and syncope: -Felt to be orthostatic in etiology  -No current syncope leading up to this admission -Cardiac workup has been reassuring including cardiac MRI without evidence of LGE, LHC without evidence of obstructive CAD, echo with normalization of LV systolic function, and multiple outpatient cardiac monitors showing no significant arrhythmia, evidence of high-grade AV block, or prolonged pauses  -Prolonged QT in the setting of BBB  -Orthostatics remain positive on PT note, recommend ongoing hydration with repeating orthostatic vital signs tomorrow -Likely  exacerbated by pneumonia, dehydration, and A-fib/flutter -Will need to allow for some degree of permissive hypertension -Encouraged adequate fluid intake in the outpatient setting -Consider abdominal binder and thigh-high compression socks  2. PAF/flutter: -Currently maintaining sinus rhythm -A-fib/flutter by itself should not lead to syncope -Prior cardiac monitoring has shown no evidence of significant posttermination pauses -Monitor on telemetry -Follow-up with EP for consideration of loop recorder -Continue PTA carvedilol, amiodarone, and apixaban  3. 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 NICM: -She appears euvolemic and well compensated -Echo this admission showed normalization of LV systolic function -She remains on carvedilol -Not requiring a standing loop diuretic  5. AKI: -Continue hydration  6. Hypokalemia: -Receiving repletion via IV         For questions or updates, please contact [place] Please consult [email or web address] for contact info under Cardiology/STEMI.   Signed, Christell Faith, PA-C [facility] Pager: [phone] 12/12/2022, 12:28 PM

## 2022-12-12 NOTE — Assessment & Plan Note (Signed)
QTc 530s on presentation w/ acute on chronic syncope  Titrate home regimen pending  re-evaluation by cardiology  Follow closely

## 2022-12-12 NOTE — Assessment & Plan Note (Signed)
Euvolemic  Cont home regimen  Strict Is and Os and daily weights  Follow

## 2022-12-12 NOTE — Assessment & Plan Note (Signed)
Recurring issue with noted prior admission June 2023 with extensive cardiac workup Suspect possible decompensation in setting of worsening SOB over past 1-2 days w/ RUL PNA on imaging  Gentle IVF hydration  Will place on CAP treatment  EKG fairly stable apart from mild increase in QT prolongation- hold offending agents  No reported recent medication changes apart from decrease on BB by cardiology in the outpatient setting  Borderline elevated trop w/ minimal chest pressure  Telemetry monitoring overnight  Plan for formal cardiology consult in am

## 2022-12-12 NOTE — Evaluation (Signed)
Physical Therapy Evaluation Patient Details Name: Caroline Zamora MRN: [id number] DOB: 01-07-1941 Today's Date: 12/12/2022  History of Present Illness  82 y/o female presented to ED on 12/11/21 for dizziness and weakness associated with fall. CT head negative. CTA showed R upper lobe concerning for pneumonia. 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 PMH: Afib, HTN, CKD, depression, migraines, and arthritis.  Clinical Impression  Patient admitted with the above. PTA, patient lives alone and is typically independent with mobility with no AD, but endorses numerous falls 2/2 syncope. Family close by but available PRN. Patient presents with weakness, impaired balance, and decreased activity tolerance. Obtained orthostatics, see below. Able to complete bed mobility modI but required min guard for sit to stand and sidesteps towards [facility] with HHAx1. Reports of lightheadedness after 1-2 minutes of standing. Patient declined further mobility due to fatigue. Encouraged patient to ambulate or transfer to [facility] with nursing for voiding to improve strength and activity tolerance.  Patient will benefit from skilled PT services during acute stay to address listed deficits. Anticipate patient will not need follow PT at discharge. Will continue to follow acutely.    Orthostatic BPs  Supine 128/61  Sitting 133/89  Standing 103/93  Standing after 2 min 111/65      Recommendations for follow up therapy are one component of a multi-disciplinary discharge planning process, led by the attending physician.  Recommendations may be updated based on patient status, additional functional criteria and insurance authorization.  Follow Up Recommendations No PT follow up      Assistance Recommended at Discharge Intermittent Supervision/Assistance  Patient can return home with the following  A little help with walking and/or transfers;A little help with  bathing/dressing/bathroom;Assist for transportation;Help with stairs or ramp for entrance    Equipment Recommendations None recommended by PT  Recommendations for Other Services       Functional Status Assessment Patient has had a recent decline in their functional status and demonstrates the ability to make significant improvements in function in a reasonable and predictable amount of time.     Precautions / Restrictions Precautions Precautions: Fall Precaution Comments: orthostatic Restrictions Weight Bearing Restrictions: No      Mobility  Bed Mobility Overal bed mobility: Modified Independent             General bed mobility comments: HOB elevated    Transfers Overall transfer level: Needs assistance Equipment used: 1 person hand held assist Transfers: Sit to/from Stand Sit to Stand: Min guard           General transfer comment: min guard for safety    Ambulation/Gait Ambulation/Gait assistance: Min guard   Assistive device: 1 person hand held assist       Pre-gait activities: standing marching, sidesteps to [place]            Wheelchair Mobility    Modified Rankin (Stroke Patients Only)       Balance Overall balance assessment: Mild deficits observed, not formally tested, History of Falls                                           Pertinent Vitals/Pain Pain Assessment Pain Assessment: Faces Faces Pain Scale: Hurts little more Pain Location: back Pain Descriptors / Indicators: Discomfort, Grimacing, Guarding Pain Intervention(s): Monitored during session, Repositioned    Home  Living Family/patient expects to be discharged to:: Private residence Living Arrangements: Alone Available Help at Discharge: Family;Available PRN/intermittently Type of Home: House Home Access: Stairs to enter Entrance Stairs-Rails: Left Entrance Stairs-Number of Steps: 4 (B railings from back entrance; L railing from front  entrance) Alternate Level Stairs-Number of Steps: flight of steps Home Layout: Two level;1/2 bath on main level;Bed/bath upstairs Home Equipment: Standard Karisma Meiser;Cane - single point;Crutches;Rolling Savior Himebaugh (2 wheels)      Prior Function Prior Level of Function : Independent/Modified Independent;History of Falls (last six months);Driving             Mobility Comments: amb with no AD. reports numerous falls in past 6 months due to syncope       Hand Dominance        Extremity/Trunk Assessment   Upper Extremity Assessment Upper Extremity Assessment: Defer to OT evaluation    Lower Extremity Assessment Lower Extremity Assessment: Generalized weakness    Cervical / Trunk Assessment Cervical / Trunk Assessment: Other exceptions Cervical / Trunk Exceptions: subacute T12 compression fx  Communication   Communication: No difficulties  Cognition Arousal/Alertness: Awake/alert Behavior During Therapy: WFL for tasks assessed/performed Overall Cognitive Status: Within Functional Limits for tasks assessed                                          General Comments      Exercises     Assessment/Plan    PT Assessment Patient needs continued PT services  PT Problem List Decreased strength;Decreased activity tolerance;Decreased balance;Decreased mobility;Cardiopulmonary status limiting activity;Decreased safety awareness       PT Treatment Interventions Gait training;DME instruction;Functional mobility training;Therapeutic activities;Therapeutic exercise;Stair training;Balance training;Patient/family education    PT Goals (Current goals can be found in the Care Plan section)  Acute Rehab PT Goals Patient Stated Goal: to feel better and rest PT Goal Formulation: With patient Time For Goal Achievement: 12/26/22 Potential to Achieve Goals: Good    Frequency Min 2X/week     Co-evaluation               AM-PAC PT "6 Clicks" Mobility  Outcome  Measure Help needed turning from your back to your side while in a flat bed without using bedrails?: None Help needed moving from lying on your back to sitting on the side of a flat bed without using bedrails?: None Help needed moving to and from a bed to a chair (including a wheelchair)?: A Little Help needed standing up from a chair using your arms (e.g., wheelchair or bedside chair)?: A Little Help needed to walk in hospital room?: A Little Help needed climbing 3-5 steps with a railing? : A Little 6 Click Score: 20    End of Session   Activity Tolerance: Patient tolerated treatment well;Patient limited by fatigue Patient left: in bed;with call bell/phone within reach;with bed alarm set;with nursing/sitter in room Nurse Communication: Mobility status PT Visit Diagnosis: Muscle weakness (generalized) (M62.81);Unsteadiness on feet (R26.81);Repeated falls (R29.6)    Time: 1106-1130 PT Time Calculation (min) (ACUTE ONLY): 24 min   Charges:   PT Evaluation $PT Eval Low Complexity: 1 Low PT Treatments $Therapeutic Activity: 8-22 mins        Ivan Lacher A. Gilford Rile PT, DPT [facility] Acute [facility]   Ravindra Baranek A Trayson Stitely 12/12/2022, 12:51 PM

## 2022-12-12 NOTE — Progress Notes (Signed)
*  PRELIMINARY RESULTS* Echocardiogram 2D Echocardiogram has been performed.  Caroline Zamora 12/12/2022, 9:44 AM

## 2022-12-12 NOTE — Care Management Obs Status (Signed)
[place] NOTIFICATION   Patient Details  Name: MYRLENE RIERA MRN: [id number] Date of Birth: [birth year]   Medicare Observation Status Notification Given:  Yes    Candie Chroman, LCSW 12/12/2022, 3:37 PM

## 2022-12-12 NOTE — ED Notes (Signed)
Contacted charge nurse who is sitting by Network engineer to contact transport for patient to go to room assignment

## 2022-12-12 NOTE — Progress Notes (Addendum)
PROGRESS NOTE  Caroline Zamora    DOB: [birth year], 82 y.o.  IHK:[id number]    Code Status: Full Code   DOA: 12/11/2022   LOS: 0   Brief hospital course  Caroline Zamora is a 82 y.o. female with a PMH significant for atrial fibrillation, recurring syncope, hypertension, hyperlipidemia presenting with syncope, prolonged QT and community-acquired pneumonia, fall.  They presented from home to the ED on 12/11/2022 with syncope. Patient noted to have been admitted back in June for similar symptoms associated with syncope. Had a fairly extensive cardiac workup with plan for abdominal binder and compression stockings.   In the ED, it was found that they had afebrile, hemodynamically stable. Satting well on room air.  Significant findings included White count 12.2, hemoglobin 15.  Creatinine 1.33.  Troponin in the 20s.  COVID-negative.  D-dimer 2.4.  CT head stable.  CT angio negative for PE but does show right upper lobe opacity concerning for pneumonia.  Noted subacute burst type compression fracture at T12 with up to 45% height loss.Caroline Zamora  Positive chronic compression deformities involving T2 and T 3.  They were initially treated with amiodarone, algesia, potassium, azithromycin, CTX for PNA.   Patient was admitted to medicine service for further workup and management of syncope, PNA, AKI as outlined in detail below.  12/12/22 -stable  Assessment & Plan  Principal Problem:   Syncope Active Problems:   Recurrent syncope   CAP (community acquired pneumonia)   A-fib ([place])   HFrEF (heart failure with reduced ejection fraction) (H[facility])   Elevated troponin   Prolonged QT interval   Type 2 diabetes mellitus ([place])   Fall  Recurrent syncope Recurring issue with noted prior admission June 2023 with extensive cardiac workup Suspect possible decompensation in setting of worsening SOB over past 1-2 days w/ RUL PNA on imaging. Negative PE. Hemodynamically stable.  - Cardiology consulted, appreciate  recs - f/u echo results - orthostatic vitals.  - repeat trops - consider seizure workup  Afib RVR- currently rate controlled. Elevated to 130 overnight resolved with amiodarone - continue Eliquis  Telemetry monitoring  Follow closely  AKI- Cr 1.89 from normal baseline. - BMP am  Hypokalemia- resolved - monitor and replete PRN   CAP Worsening SOB and decompensated syncope w/ noted RUL infiltrate on imaging  WBC 12 No hypoxia - azithromycin changed to doxycycline for QTC prolongation  - continue CTX   Fall Recurring issue in the setting of recurrent syncope and collapse at home Imaging stable apart from noted subacute T12 compression fracture Fall precautions - PT OT evaluation   Type 2 diabetes mellitus ([place]) - continue SSI - f/u A1c   Prolonged QT interval QTc 530s on presentation w/ acute on chronic syncope  Titrate home regimen pending  re-evaluation by cardiology  Follow closely   HFrEF- previous EF 35-40% in 5/23. repeat echo pending Euvolemic  Cont home regimen  Strict Is and Os and daily weights    Body mass index is 29.05 kg/m.  VTE ppx: SCDs Start: 12/11/22 2138 apixaban (ELIQUIS) tablet 5 mg   Diet:     Diet   Diet Carb Modified Fluid consistency: Thin; Room service appropriate? Yes   Consultants: Cardiology   Subjective 12/12/22    Pt reports no chest pain. Continue to have mild SOB. Comfortable at rest. Currently getting echo.   Objective   Vitals:   12/12/22 0530 12/12/22 0545 12/12/22 0600 12/12/22 0630  BP: 116/70  120/60 118/66  Pulse: 78 83  77 76  Resp: '18 18 16 '$ (!) 21  Temp:      TempSrc:      SpO2: 95% 96% 96% 98%  Weight:      Height:        Intake/Output Summary (Last 24 hours) at 12/12/2022 0718 Last data filed at 12/12/2022 0403 Gross per 24 hour  Intake 950 ml  Output 500 ml  Net 450 ml   Filed Weights   12/11/22 0919  Weight: 81.6 kg     Physical Exam:  General: awake, alert, NAD HEENT: atraumatic, clear  conjunctiva, anicteric sclera, MMM, hearing grossly normal Respiratory: normal respiratory effort. Cardiovascular: quick capillary refill, normal S1/S2, RRR, no JVD, murmurs Nervous: A&O x3. no gross focal neurologic deficits, normal speech Extremities: moves all equally, no edema, normal tone Skin: dry, intact, normal temperature, normal color. No rashes, lesions or ulcers on exposed skin Psychiatry: normal mood, congruent affect  Labs   I have personally reviewed the following labs and imaging studies CBC    Component Value Date/Time   WBC 9.7 12/12/2022 0338   RBC 3.94 12/12/2022 0338   HGB 12.1 12/12/2022 0338   HCT 37.5 12/12/2022 0338   PLT 241 12/12/2022 0338   MCV 95.2 12/12/2022 0338   MCH 30.7 12/12/2022 0338   MCHC 32.3 12/12/2022 0338   RDW 15.8 (H) 12/12/2022 0338   LYMPHSABS 2.4 12/12/2022 0338   MONOABS 1.2 (H) 12/12/2022 0338   EOSABS 0.1 12/12/2022 0338   BASOSABS 0.0 12/12/2022 0338      Latest Ref Rng & Units 12/12/2022    3:38 AM 12/11/2022    9:23 AM 05/17/2022    8:13 AM  BMP  Glucose 70 - 99 mg/dL 99  188  94   BUN 8 - 23 mg/dL '27  17  15   '$ Creatinine 0.44 - 1.00 mg/dL 1.89  1.33  0.78   Sodium 135 - 145 mmol/L 133  133  135   Potassium 3.5 - 5.1 mmol/L 3.9  3.1  3.8   Chloride 98 - 111 mmol/L 100  97  108   CO2 22 - 32 mmol/L '22  22  22   '$ Calcium 8.9 - 10.3 mg/dL 7.6  8.2  7.4     CT Lumbar Spine Wo Contrast  Result Date: 12/11/2022 CLINICAL DATA:  Follow-up examination for spinal fracture. EXAM: CT LUMBAR SPINE WITHOUT CONTRAST TECHNIQUE: Multidetector CT imaging of the lumbar spine was performed without intravenous contrast administration. Multiplanar CT image reconstructions were also generated. RADIATION DOSE REDUCTION: This exam was performed according to the departmental dose-optimization program which includes automated exposure control, adjustment of the mA and/or kV according to patient size and/or use of iterative reconstruction technique.  COMPARISON:  Prior CT from earlier the same day. FINDINGS: Segmentation: Standard. Lowest well-formed disc space labeled the L5-S1 level. Alignment: Mild levoscoliosis. Alignment otherwise normal with preservation of the normal lumbar lordosis. No significant listhesis. Vertebrae: Acute burst type compression fracture of T12 noted, described on corresponding thoracic spine CT. Otherwise, vertebral body height maintained. Visualized sacrum and pelvis intact. No discrete or worrisome osseous lesions. Subacute healing fractures of the right posterior tenth and eleventh ribs noted. Paraspinal and other soft tissues: Paraspinous edema adjacent to the T12 compression fracture. Hepatic steatosis noted. Aortic atherosclerosis. Few simple right renal cyst noted, benign in appearance, no follow-up imaging recommended. Disc levels: L1-2: Reactive endplate spurring with minimal annular disc bulge. Mild facet hypertrophy. No significant canal or foraminal stenosis. L2-3: Degenerative intervertebral  disc space narrowing with diffuse disc bulge and reactive endplate spurring, asymmetric to the right. Right worse than left facet hypertrophy. Resultant mild canal with bilateral subarticular stenosis. Moderate right with mild left L2 foraminal narrowing. L3-4: Disc bulge with endplate spurring. Moderate facet hypertrophy. Resultant mild-to-moderate spinal stenosis. Mild bilateral L3 foraminal narrowing. L4-5: Degenerative intervertebral disc space narrowing with diffuse disc bulge and reactive endplate spurring, asymmetric to the right. Moderate to advanced right worse than left facet arthrosis. Resultant mild canal with bilateral subarticular stenosis. Moderate bilateral L4 foraminal narrowing. L5-S1: Diffuse disc bulge. Moderate facet hypertrophy. No spinal stenosis. Mild left L5 foraminal narrowing. IMPRESSION: 1. Acute burst type compression fracture of T12, described on corresponding thoracic spine CT. 2. No other acute  traumatic injury within the lumbar spine. 3. Subacute healing fractures of the right posterior tenth and eleventh ribs. 4. Moderate multilevel degenerative spondylosis and facet arthrosis as above, resulting in mild-to-moderate spinal stenosis at L2-3 through L4-5. 5. Hepatic steatosis. Aortic Atherosclerosis (ICD10-I70.0). Electronically Signed   By: Jeannine Boga M.D.   On: 12/11/2022 20:42   CT Thoracic Spine Wo Contrast  Result Date: 12/11/2022 CLINICAL DATA:  Initial evaluation for acute spinal fracture. EXAM: CT THORACIC SPINE WITHOUT CONTRAST TECHNIQUE: Multidetector CT images of the thoracic were obtained using the standard protocol without intravenous contrast. RADIATION DOSE REDUCTION: This exam was performed according to the departmental dose-optimization program which includes automated exposure control, adjustment of the mA and/or kV according to patient size and/or use of iterative reconstruction technique. COMPARISON:  Prior CT from earlier the same day. FINDINGS: Alignment: Straightening of the normal thoracic kyphosis. No significant listhesis. Vertebrae: Acute burst type compression fracture involving the T12 vertebral body is seen. Associated height loss measures up to 45% with 6 mm bony retropulsion. No visible extension through the posterior elements. Secondary flattening of the ventral thecal sac with mild to moderate spinal stenosis at this level. Additional subacute fractures of the right posterior ninth, tenth, and eleventh ribs noted. No other acute or recent fracture. Mild chronic compression deformity involving the superior endplates of T2 and T3 without bony retropulsion noted. Vertebral body height otherwise maintained. No worrisome osseous lesions. Paraspinal and other soft tissues: Mild paraspinous edema adjacent to the T12 fracture. Hepatic steatosis noted. Simple right renal cyst partially visualize, benign in appearance, no follow-up imaging recommended. Aortic  atherosclerosis. Disc levels: T2-3: Right-sided facet arthrosis with resultant severe right foraminal stenosis. No spinal stenosis. T3-4: Central disc protrusion indents the ventral thecal sac without significant spinal stenosis. Foramina remain patent. T4-5: Small left paracentral disc protrusion without significant stenosis. T5-6: Small left paracentral disc protrusion without significant stenosis. T11 12: 6 mm bony retropulsion related to the T12 compression fracture. Resultant mild-to-moderate spinal stenosis. Foramina remain patent. Otherwise, no other significant disc pathology seen by CT. IMPRESSION: 1. Acute burst type compression fracture involving the T12 vertebral body with up to 45% height loss and 6 mm bony retropulsion. 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 2. Additional subacute fractures of the right posterior ninth, tenth, and eleventh ribs. 3. Mild chronic compression deformities involving the superior endplates of T2 and T3 without bony retropulsion. 4. 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 5. Hepatic steatosis. Aortic Atherosclerosis (ICD10-I70.0). Electronically Signed   By: Jeannine Boga M.D.   On: 12/11/2022 20:35   CT Angio Chest Pulmonary Embolism (PE) W or WO Contrast  Result Date: 12/11/2022 CLINICAL DATA:  Weak dizzy EXAM: CT ANGIOGRAPHY CHEST WITH CONTRAST TECHNIQUE: Multidetector CT imaging of  the chest was performed using the standard protocol during bolus administration of intravenous contrast. Multiplanar CT image reconstructions and MIPs were obtained to evaluate the vascular anatomy. RADIATION DOSE REDUCTION: This exam was performed according to the departmental dose-optimization program which includes automated exposure control, adjustment of the mA and/or kV according to patient size and/or use of iterative reconstruction technique. CONTRAST:  50m OMNIPAQUE IOHEXOL 350 MG/ML SOLN COMPARISON:  None Available. FINDINGS: Cardiovascular: Satisfactory  opacification of the pulmonary arteries to the segmental level. No evidence of pulmonary embolism. Normal heart size. No pericardial effusion. Nonaneurysmal aorta. No dissection is seen. Mediastinum/Nodes: Midline trachea. No thyroid mass. No suspicious lymph nodes. Small moderate hiatal hernia. Lungs/Pleura: Heterogeneous ground-glass density in the right upper lobe. No pleural effusion or pneumothorax Upper Abdomen: No acute abnormality. Musculoskeletal: Partially visualized acute to subacute burst type compression fracture of T12 with about 7 mm retropulsion of posterior vertebral body fracture fragment into the spinal canal. There is mass effect and flattening of the anterior thecal sac. Review of the MIP images confirms the above findings. IMPRESSION: 1. Negative for acute pulmonary embolus or aortic dissection. 2. Heterogeneous ground-glass density in the right upper lobe, suspicious for pneumonia. 3. Partially visualized acute to subacute burst type compression fracture of T12 with about 7 mm retropulsion of posterior vertebral body fracture fragment into the spinal canal. There is mass effect on and flattening of the anterior thecal sac. Critical Value/emergent results were called by telephone at the time of interpretation on 12/11/2022 at 6:36 pm to [facility], who verbally acknowledged these results. Aortic Atherosclerosis (ICD10-I70.0). Electronically Signed   By: KDonavan FoilM.D.   On: 12/11/2022 18:36   DG Thoracic Spine 2 View  Result Date: 12/11/2022 CLINICAL DATA:  Fall. EXAM: THORACIC SPINE 2 VIEWS COMPARISON:  None Available. FINDINGS: Moderate compression deformity of T12 vertebral body is noted most consistent with old fracture. No definite acute fracture or spondylolisthesis is noted. Minimal degenerative changes are seen involving the lower thoracic spine. IMPRESSION: Moderate T12 compression deformity is noted most consistent with old fracture. However, if patient is  symptomatic in this area, MRI may be performed to evaluate for possible acute fracture. No other definite abnormality is seen. Electronically Signed   By: JMarijo ConceptionM.D.   On: 12/11/2022 17:07   CT Cervical Spine Wo Contrast  Result Date: 12/11/2022 CLINICAL DATA:  Trauma, recent fall EXAM: CT CERVICAL SPINE WITHOUT CONTRAST TECHNIQUE: Multidetector CT imaging of the cervical spine was performed without intravenous contrast. Multiplanar CT image reconstructions were also generated. RADIATION DOSE REDUCTION: This exam was performed according to the departmental dose-optimization program which includes automated exposure control, adjustment of the mA and/or kV according to patient size and/or use of iterative reconstruction technique. COMPARISON:  05/15/2022 FINDINGS: Alignment: Alignment of posterior margins of vertebral bodies has not changed. There is minimal 1-2 mm anterolisthesis at C3-C4 and C7-T1 levels with no significant change. Skull base and vertebrae: No recent fracture is seen. Degenerative changes are noted. Soft tissues and spinal canal: There is extrinsic pressure over the ventral margin of thecal sac caused by posterior bony spurs from C4-C7 levels. Disc levels: There is encroachment of neural foramina from C3 to T1 levels. Upper chest: Unremarkable. Other: None. IMPRESSION: No recent fracture is seen in cervical spine. 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 No significant interval changes are noted. Electronically Signed   By: PElmer PickerM.D.   On: 12/11/2022 10:43  CT HEAD WO CONTRAST (5MM)  Result Date: 12/11/2022 CLINICAL DATA:  Trauma EXAM: CT HEAD WITHOUT CONTRAST TECHNIQUE: Contiguous axial images were obtained from the base of the skull through the vertex without intravenous contrast. RADIATION DOSE REDUCTION: This exam was performed according to the departmental dose-optimization program which includes automated exposure control,  adjustment of the mA and/or kV according to patient size and/or use of iterative reconstruction technique. COMPARISON:  CT brain done on 05/15/2022 FINDINGS: Brain: No acute intracranial findings are seen. There are no signs of bleeding within the cranium. Cortical sulci are prominent. There is decreased density in periventricular white matter. There is no focal edema or mass effect. Vascular: Unremarkable. Skull: No fracture is seen in calvarium. Sinuses/Orbits: No acute findings are seen. Other: None. IMPRESSION: No acute intracranial findings are seen in noncontrast CT brain. Atrophy. Small-vessel disease. Electronically Signed   By: Elmer Picker M.D.   On: 12/11/2022 10:38   DG Hip Unilat W or Wo Pelvis 2-3 Views Left  Result Date: 12/11/2022 CLINICAL DATA:  Fall a week ago. Hematoma to the left hip and complains of left hip pain. Fall again today. EXAM: DG HIP (WITH OR WITHOUT PELVIS) 2-3V LEFT COMPARISON:  None Available. FINDINGS: There is no evidence of hip fracture or dislocation. Superolateral hip joint space narrowing with acetabular lip osteophytes. Marked soft tissue swelling of the left gluteal region. IMPRESSION: 1. No acute fracture or dislocation. 2. Marked soft tissue swelling of the left gluteal region. Electronically Signed   By: Keane Police D.O.   On: 12/11/2022 10:22    Disposition Plan & Communication  Patient status: Observation  Admitted From: Home Planned disposition location: Home Anticipated discharge date: 1/10 pending cardiology clearance   Family Communication: none at bedside    Author: Richarda Osmond, DO [facility] 12/12/2022, 7:18 AM   Available by Epic secure chat 7AM-7PM. If 7PM-7AM, please contact night-coverage.  [facility] contact information found on [email or web address].

## 2022-12-12 NOTE — Evaluation (Signed)
Occupational Therapy Evaluation Patient Details Name: Caroline Zamora MRN: [id number] DOB: 1941-03-12 Today's Date: 12/12/2022   History of Present Illness 82 y/o female presented to ED on 12/11/21 for dizziness and weakness associated with fall. CT head negative. CTA showed R upper lobe concerning for pneumonia. 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 PMH: Afib, HTN, CKD, depression, migraines, and arthritis.   Clinical Impression   Caroline Zamora presents with a long history of falls -- she endorses "at least 20" syncope-related falls in the previous 12 months. She reports that wearing an abdominal binder and/or compression stockings does not seem to help. She also states she has not fallen when she's using either a RW or a SPC, but that she primarily ambulates w/o AD. During today's session, pt is able to perform bed mobility, transfers, ambulation, toileting, grooming, all w/ SUPV for safety but no physical assistance required, no LOB. Pt reports frequently feeling lightheaded. She lives alone in a 2-story house with bed and full bath upstairs. Pt has a routine of coming down to first floor and remaining at the level all day, only returning upstairs at bedtime. She reports that she has no difficulties moving downstairs but that, of late, she becomes SOB when going upstairs. Pt has been IND with most B/IADLS, drives and participates in [facility] several days per week. Pt reports that her son recently said to her that at least some of her falls might be related to excessive alcohol consumption, and pt states she is now (as of 1 week ago) committed to consuming less wine. Pt also reports that she believes her syncope may be 2/2 dehydration and that she plans to increase amount of water she drinks daily. Provided educ re: balance, AD use, home safety, taking increased time between supine<sit<stand transitions. Pt reports she does not  need any additional OT services and that she is signed up for a balance-improvement class [facility] at present. Will sign off now.     Recommendations for follow up therapy are one component of a multi-disciplinary discharge planning process, led by the attending physician.  Recommendations may be updated based on patient status, additional functional criteria and insurance authorization.   Follow Up Recommendations  No OT follow up     Assistance Recommended at Discharge Intermittent Supervision/Assistance  Patient can return home with the following A little help with bathing/dressing/bathroom;Help with stairs or ramp for entrance    Functional Status Assessment  Patient has had a recent decline in their functional status and demonstrates the ability to make significant improvements in function in a reasonable and predictable amount of time.  Equipment Recommendations  None recommended by OT    Recommendations for Other Services       Precautions / Restrictions Precautions Precautions: Fall Precaution Comments: orthostatic Restrictions Weight Bearing Restrictions: No      Mobility Bed Mobility Overal bed mobility: Modified Independent             General bed mobility comments: HOB elevated    Transfers Overall transfer level: Needs assistance Equipment used: Rolling walker (2 wheels) Transfers: Sit to/from Stand Sit to Stand: Supervision                  Balance Overall balance assessment: History of Falls, Needs assistance Sitting-balance support: Single extremity supported, Feet unsupported Sitting balance-Leahy Scale: Good     Standing balance support: During functional activity, Bilateral  upper extremity supported Standing balance-Leahy Scale: Good                             ADL either performed or assessed with clinical judgement   ADL Overall ADL's : Needs assistance/impaired Eating/Feeding: Modified independent   Grooming:  Wash/dry hands;Oral care;Standing;Supervision/safety                   Toilet Transfer: Supervision/safety;Regular Toilet;Rolling walker (2 wheels)   Toileting- Clothing Manipulation and Hygiene: Modified independent;Sit to/from stand       Functional mobility during ADLs: Supervision/safety;Rolling walker (2 wheels)       Vision         Perception     Praxis      Pertinent Vitals/Pain Pain Assessment Faces Pain Scale: Hurts little more Pain Location: back Pain Descriptors / Indicators: Discomfort, Grimacing, Guarding Pain Intervention(s): Limited activity within patient's tolerance, Monitored during session, Repositioned     Hand Dominance Right   Extremity/Trunk Assessment Upper Extremity Assessment Upper Extremity Assessment: Overall WFL for tasks assessed   Lower Extremity Assessment Lower Extremity Assessment: Overall WFL for tasks assessed   Cervical / Trunk Assessment Cervical / Trunk Assessment: Other exceptions Cervical / Trunk Exceptions: subacute T12 compression fx   Communication Communication Communication: No difficulties   Cognition Arousal/Alertness: Awake/alert Behavior During Therapy: WFL for tasks assessed/performed Overall Cognitive Status: Within Functional Limits for tasks assessed                                       General Comments       Exercises Other Exercises Other Exercises: Educ re: falls prevention, AD use, slow transitions between movements   Shoulder Instructions      Home Living Family/patient expects to be discharged to:: Private residence Living Arrangements: Alone Available Help at Discharge: Family;Available PRN/intermittently Type of Home: House Home Access: Stairs to enter [employer] of Steps: 4 (B railings from back entrance; L railing from front entrance) Entrance Stairs-Rails: Left Home Layout: Two level;1/2 bath on main level;Bed/bath upstairs Alternate Level  Stairs-Number of Steps: flight of steps Alternate Level Stairs-Rails: Left Bathroom Shower/Tub: Tub/shower unit;Walk-in shower   Bathroom Toilet: Handicapped height     Home Equipment: Standard Walker;Cane - single Environmental consultant (2 wheels)   Additional Comments: H/o ankle fx and R LE fx in November 2022.      Prior Functioning/Environment Prior Level of Function : Independent/Modified Independent;History of Falls (last six months);Driving             Mobility Comments: amb with no AD. reports numerous falls in past 6 months due to syncope ADLs Comments: pt reports she has been requiring assist for IADL from son and his wife, mod I for ADL        OT Problem List: Decreased activity tolerance;Impaired balance (sitting and/or standing);Cardiopulmonary status limiting activity      OT Treatment/Interventions:      OT Goals(Current goals can be found in the care plan section) Acute Rehab OT Goals Patient Stated Goal: to drink more water OT Goal Formulation: With patient Time For Goal Achievement: 12/26/22 Potential to Achieve Goals: Good  OT Frequency:      Co-evaluation              AM-PAC OT "6 Clicks" Daily Activity     Outcome Measure Help from  another person eating meals?: None Help from another person taking care of personal grooming?: None Help from another person toileting, which includes using toliet, bedpan, or urinal?: A Little Help from another person bathing (including washing, rinsing, drying)?: A Little Help from another person to put on and taking off regular upper body clothing?: None Help from another person to put on and taking off regular lower body clothing?: A Little 6 Click Score: 21   End of Session Equipment Utilized During Treatment: Rolling walker (2 wheels)  Activity Tolerance: Patient tolerated treatment well Patient left: in bed;with call bell/phone within reach;with bed alarm set  OT Visit Diagnosis: Unsteadiness on  feet (R26.81);History of falling (Z91.81);Muscle weakness (generalized) (M62.81)                Time: 9509-3267 OT Time Calculation (min): 19 min Charges:  OT General Charges $OT Visit: 1 Visit OT Evaluation $OT Eval Low Complexity: 1 Low OT Treatments $Self Care/Home Management : 8-22 mins Josiah Lobo, PhD, MS, OTR/L 12/12/22, 3:58 PM

## 2022-12-13 DIAGNOSIS — E876 Hypokalemia: Secondary | ICD-10-CM | POA: Diagnosis not present

## 2022-12-13 DIAGNOSIS — I480 Paroxysmal atrial fibrillation: Secondary | ICD-10-CM

## 2022-12-13 DIAGNOSIS — I4891 Unspecified atrial fibrillation: Secondary | ICD-10-CM | POA: Diagnosis not present

## 2022-12-13 DIAGNOSIS — I951 Orthostatic hypotension: Secondary | ICD-10-CM | POA: Diagnosis not present

## 2022-12-13 DIAGNOSIS — R531 Weakness: Secondary | ICD-10-CM | POA: Diagnosis not present

## 2022-12-13 DIAGNOSIS — W19XXXA Unspecified fall, initial encounter: Secondary | ICD-10-CM | POA: Diagnosis not present

## 2022-12-13 DIAGNOSIS — F410 Panic disorder [episodic paroxysmal anxiety] without agoraphobia: Secondary | ICD-10-CM

## 2022-12-13 DIAGNOSIS — R55 Syncope and collapse: Secondary | ICD-10-CM | POA: Diagnosis not present

## 2022-12-13 LAB — CBC
HCT: 31.4 % — ABNORMAL LOW (ref 36.0–46.0)
Hemoglobin: 10.2 g/dL — ABNORMAL LOW (ref 12.0–15.0)
MCH: 30.4 pg (ref 26.0–34.0)
MCHC: 32.5 g/dL (ref 30.0–36.0)
MCV: 93.7 fL (ref 80.0–100.0)
Platelets: 207 10*3/uL (ref 150–400)
RBC: 3.35 MIL/uL — ABNORMAL LOW (ref 3.87–5.11)
RDW: 15.4 % (ref 11.5–15.5)
WBC: 6.7 10*3/uL (ref 4.0–10.5)
nRBC: 0 % (ref 0.0–0.2)

## 2022-12-13 LAB — BASIC METABOLIC PANEL
Anion gap: 7 (ref 5–15)
BUN: 27 mg/dL — ABNORMAL HIGH (ref 8–23)
CO2: 23 mmol/L (ref 22–32)
Calcium: 7.3 mg/dL — ABNORMAL LOW (ref 8.9–10.3)
Chloride: 105 mmol/L (ref 98–111)
Creatinine, Ser: 1.05 mg/dL — ABNORMAL HIGH (ref 0.44–1.00)
GFR, Estimated: 54 mL/min — ABNORMAL LOW (ref >60)
Glucose, Bld: 100 mg/dL — ABNORMAL HIGH (ref 70–99)
Potassium: 4 mmol/L (ref 3.5–5.1)
Sodium: 135 mmol/L (ref 135–145)

## 2022-12-13 LAB — GLUCOSE, CAPILLARY
Glucose-Capillary: 102 mg/dL — ABNORMAL HIGH (ref 70–99)
Glucose-Capillary: 119 mg/dL — ABNORMAL HIGH (ref 70–99)

## 2022-12-13 LAB — HEMOGLOBIN A1C
Hgb A1c MFr Bld: 5.6 % (ref 4.8–5.6)
Mean Plasma Glucose: 114 mg/dL

## 2022-12-13 MED ORDER — AMIODARONE IV BOLUS ONLY 150 MG/100ML
150.0000 mg | Freq: Once | INTRAVENOUS | Status: DC
  Filled 2022-12-13: qty 100

## 2022-12-13 MED ORDER — LEVOTHYROXINE SODIUM 50 MCG PO TABS
50.0000 ug | ORAL_TABLET | Freq: Every day | ORAL | Status: DC
  Administered 2022-12-14: 50 ug via ORAL
  Filled 2022-12-13: qty 1

## 2022-12-13 MED ORDER — ACETAMINOPHEN 325 MG PO TABS
650.0000 mg | ORAL_TABLET | Freq: Four times a day (QID) | ORAL | Status: DC | PRN
  Administered 2022-12-13 – 2022-12-14 (×3): 650 mg via ORAL
  Filled 2022-12-13 (×3): qty 2

## 2022-12-13 MED ORDER — AMIODARONE HCL 200 MG PO TABS
400.0000 mg | ORAL_TABLET | Freq: Two times a day (BID) | ORAL | Status: DC
  Administered 2022-12-13 – 2022-12-14 (×3): 400 mg via ORAL
  Filled 2022-12-13 (×3): qty 2

## 2022-12-13 MED ORDER — ACETAMINOPHEN 325 MG PO TABS
650.0000 mg | ORAL_TABLET | Freq: Once | ORAL | Status: AC
  Administered 2022-12-13: 650 mg via ORAL

## 2022-12-13 NOTE — Plan of Care (Signed)
  Problem: Education: Goal: Knowledge of condition and prescribed therapy will improve Outcome: Progressing   Problem: Cardiac: Goal: Will achieve and/or maintain adequate cardiac output Outcome: Progressing   Problem: Physical Regulation: Goal: Complications related to the disease process, condition or treatment will be avoided or minimized Outcome: Progressing   Problem: Education: Goal: Ability to describe self-care measures that may prevent or decrease complications (Diabetes Survival Skills Education) will improve Outcome: Progressing Goal: Individualized Educational Video(s) Outcome: Progressing   Problem: Coping: Goal: Ability to adjust to condition or change in health will improve Outcome: Progressing   Problem: Health Behavior/Discharge Planning: Goal: Ability to identify and utilize available resources and services will improve Outcome: Progressing Goal: Ability to manage health-related needs will improve Outcome: Progressing   Problem: Metabolic: Goal: Ability to maintain appropriate glucose levels will improve Outcome: Progressing   Problem: Nutritional: Goal: Maintenance of adequate nutrition will improve Outcome: Progressing Goal: Progress toward achieving an optimal weight will improve Outcome: Progressing   Problem: Skin Integrity: Goal: Risk for impaired skin integrity will decrease Outcome: Progressing   Problem: Tissue Perfusion: Goal: Adequacy of tissue perfusion will improve Outcome: Progressing   Problem: Education: Goal: Knowledge of General Education information will improve Description: Including pain rating scale, medication(s)/side effects and non-pharmacologic comfort measures Outcome: Progressing   Problem: Health Behavior/Discharge Planning: Goal: Ability to manage health-related needs will improve Outcome: Progressing   Problem: Clinical Measurements: Goal: Ability to maintain clinical measurements within normal limits will  improve Outcome: Progressing Goal: Will remain free from infection Outcome: Progressing Goal: Diagnostic test results will improve Outcome: Progressing Goal: Respiratory complications will improve Outcome: Progressing Goal: Cardiovascular complication will be avoided Outcome: Progressing   Problem: Activity: Goal: Risk for activity intolerance will decrease Outcome: Progressing

## 2022-12-13 NOTE — Progress Notes (Signed)
PROGRESS NOTE  Caroline Zamora    DOB: January 06, 1941, 82 y.o.  JIR:[id number]    Code Status: Full Code   DOA: 12/11/2022   LOS: 1   Brief hospital course  Caroline Zamora is a 82 y.o. female with a PMH significant for atrial fibrillation, recurring syncope, hypertension, hyperlipidemia presenting with syncope, prolonged QT and community-acquired pneumonia, fall.  They presented from home to the ED on 12/11/2022 with syncope. Patient noted to have been admitted back in June for similar symptoms associated with syncope. Had a fairly extensive cardiac workup with plan for abdominal binder and compression stockings.   In the ED, it was found that they had afebrile, hemodynamically stable. Satting well on room air.  Significant findings included White count 12.2, hemoglobin 15.  Creatinine 1.33.  Troponin in the 20s.  COVID-negative.  D-dimer 2.4.  CT head stable.  CT angio negative for PE but does show right upper lobe opacity concerning for pneumonia.  Noted subacute burst type compression fracture at T12 with up to 45% height loss.Marland Kitchen  Positive chronic compression deformities involving T2 and T 3.  They were initially treated with amiodarone, algesia, potassium, azithromycin, CTX for PNA.   Patient was admitted to medicine service for further workup and management of syncope, PNA, AKI as outlined in detail below.  12/13/22 -stable  Assessment & Plan  Principal Problem:   Syncope Active Problems:   Recurrent syncope   CAP (community acquired pneumonia)   A-fib (HCC)   HFrEF (heart failure with reduced ejection fraction) (HCC)   Elevated troponin   Prolonged QT interval   Type 2 diabetes mellitus (HCC)   Fall   Hypokalemia   Atrial fibrillation, chronic (HCC)   Prolonged Q-T interval on ECG  Recurrent syncope- multifactorial including medication effect, orthostatic in setting of acute PNA. Recurring issue with noted prior admission June 2023 with extensive cardiac workup Suspect  possible decompensation in setting of worsening SOB over past 1-2 days w/ RUL PNA on imaging. Negative PE. Hemodynamically stable.  She will need fine adjustments of her medications to account for positive orthostatics as well as control of her afib.  - Cardiology consulted, appreciate recs - f/u echo results - repeat orthostatic vitals.  - repeat trops - consider seizure workup  Afib RVR- currently rate controlled. Elevated to 130 overnight resolved with amiodarone - cardiology managing, appreciate your care - continue Eliquis  - Telemetry monitoring  - Follow closely  AKI- Cr 1.89 from normal baseline. Improving. Cr 1.89>1.05 - BMP am  Hypokalemia- resolved s/p replacement - monitor and replete PRN   CAP Worsening SOB and decompensated syncope w/ noted RUL infiltrate on imaging  WBC 12 No hypoxia - azithromycin changed to doxycycline for QTC prolongation  - continue CTX   Fall Recurring issue in the setting of recurrent syncope and collapse at home Imaging stable apart from noted subacute T12 compression fracture Fall precautions - PT OT evaluation   Type 2 diabetes mellitus ([place])- A1c on admission 5.6. not insulin dependent at baseline.  - discontinue SSI   Prolonged QT interval QTc 530s on presentation w/ acute on chronic syncope  Titrate home regimen pending  re-evaluation by cardiology  Follow closely   HFrEF- previous EF 35-40% in 5/23. repeat echo shows overall improvement. E 55-60%.  Euvolemic  Cont home regimen  Strict Is and Os and daily weights    Body mass index is 29.05 kg/m.  VTE ppx: SCDs Start: 12/11/22 2138 apixaban (ELIQUIS) tablet 5 mg  Diet:     Diet   Diet Carb Modified Fluid consistency: Thin; Room service appropriate? Yes   Consultants: Cardiology   Subjective 12/13/22    Pt reports no chest pain. She had episode of afib RVR this morning/overnight which caused her distress but has been resolved currently at time of interview.  Denies SOB.  Able to ambulate in room without issue.    Objective   Vitals:   12/12/22 0733 12/12/22 1602 12/12/22 2132 12/13/22 0439  BP: (!) 141/67 (!) 138/112 (!) 155/70 (!) 152/62  Pulse: 76 80 80 80  Resp: '16  20 20  '$ Temp: 98 F (36.7 C) 97.9 F (36.6 C) 97.9 F (36.6 C) 98 F (36.7 C)  TempSrc: Oral Oral  Oral  SpO2: 98% 91% 99% 100%  Weight:      Height:        Intake/Output Summary (Last 24 hours) at 12/13/2022 7902 Last data filed at 12/12/2022 1829 Gross per 24 hour  Intake 1029.39 ml  Output --  Net 1029.39 ml    Filed Weights   12/11/22 0919  Weight: 81.6 kg     Physical Exam:  General: awake, alert, NAD HEENT: atraumatic, clear conjunctiva, anicteric sclera, MMM, hearing grossly normal Respiratory: normal respiratory effort. Cardiovascular: quick capillary refill, normal S1/S2, RRR, no JVD, murmurs Nervous: A&O x3. no gross focal neurologic deficits, normal speech Extremities: moves all equally, no edema, normal tone Skin: dry, intact, normal temperature, normal color. No rashes, lesions or ulcers on exposed skin Psychiatry: normal mood, congruent affect  Labs   I have personally reviewed the following labs and imaging studies CBC    Component Value Date/Time   WBC 6.7 12/13/2022 0427   RBC 3.35 (L) 12/13/2022 0427   HGB 10.2 (L) 12/13/2022 0427   HCT 31.4 (L) 12/13/2022 0427   PLT 207 12/13/2022 0427   MCV 93.7 12/13/2022 0427   MCH 30.4 12/13/2022 0427   MCHC 32.5 12/13/2022 0427   RDW 15.4 12/13/2022 0427   LYMPHSABS 2.4 12/12/2022 0338   MONOABS 1.2 (H) 12/12/2022 0338   EOSABS 0.1 12/12/2022 0338   BASOSABS 0.0 12/12/2022 0338      Latest Ref Rng & Units 12/13/2022    4:27 AM 12/12/2022    3:38 AM 12/11/2022    9:23 AM  BMP  Glucose 70 - 99 mg/dL 100  99  188   BUN 8 - 23 mg/dL '27  27  17   '$ Creatinine 0.44 - 1.00 mg/dL 1.05  1.89  1.33   Sodium 135 - 145 mmol/L 135  133  133   Potassium 3.5 - 5.1 mmol/L 4.0  3.9  3.1   Chloride 98 -  111 mmol/L 105  100  97   CO2 22 - 32 mmol/L '23  22  22   '$ Calcium 8.9 - 10.3 mg/dL 7.3  7.6  8.2     ECHOCARDIOGRAM COMPLETE  Result Date: 12/12/2022    ECHOCARDIOGRAM REPORT   Patient Name:   Caroline Zamora Date of Exam: 12/12/2022 Medical Rec #:  [id number]         Height:       66.0 in Accession #:    [id number]        Weight:       180.0 lb Date of Birth:  1941/11/11        BSA:          1.912 m Patient Age:    31 years  BP:           141/67 mmHg Patient Gender: F                 HR:           79 bpm. Exam Location:  [facility] Procedure: 2D Echo, Cardiac Doppler and Color Doppler Indications:     Syncope  History:         Patient has prior history of Echocardiogram examinations, most                  recent 04/10/2022. CHF, Arrythmias:Atrial Fibrillation,                  Signs/Symptoms:Syncope; Risk Factors:Diabetes and Non-Smoker.  Sonographer:     Wenda Low Referring Phys:  [place] Diagnosing Phys: Nelva Bush MD IMPRESSIONS  1. Left ventricular ejection fraction, by estimation, is 55 to 60%. The left ventricle has normal function. The left ventricle has no regional wall motion abnormalities. There is mild left ventricular hypertrophy. Left ventricular diastolic parameters are consistent with Grade I diastolic dysfunction (impaired relaxation).  2. Right ventricular systolic function is normal. The right ventricular size is normal.  3. The mitral valve is normal in structure. Trivial mitral valve regurgitation. No evidence of mitral stenosis.  4. The aortic valve is tricuspid. Aortic valve regurgitation is not visualized. No aortic stenosis is present. FINDINGS  Left Ventricle: Left ventricular ejection fraction, by estimation, is 55 to 60%. The left ventricle has normal function. The left ventricle has no regional wall motion abnormalities. The left ventricular internal cavity size was normal in size. There is  mild left ventricular hypertrophy. Left ventricular diastolic  parameters are consistent with Grade I diastolic dysfunction (impaired relaxation). Right Ventricle: The right ventricular size is normal. No increase in right ventricular wall thickness. Right ventricular systolic function is normal. Left Atrium: Left atrial size was normal in size. Right Atrium: Right atrial size was normal in size. Pericardium: There is no evidence of pericardial effusion. Mitral Valve: The mitral valve is normal in structure. Trivial mitral valve regurgitation. No evidence of mitral valve stenosis. MV peak gradient, 4.0 mmHg. The mean mitral valve gradient is 1.0 mmHg. Tricuspid Valve: The tricuspid valve is not well visualized. Tricuspid valve regurgitation is trivial. Aortic Valve: The aortic valve is tricuspid. Aortic valve regurgitation is not visualized. No aortic stenosis is present. Aortic valve mean gradient measures 5.0 mmHg. Aortic valve peak gradient measures 10.6 mmHg. Aortic valve area, by VTI measures 2.50  cm. Pulmonic Valve: The pulmonic valve was not well visualized. Pulmonic valve regurgitation is mild. No evidence of pulmonic stenosis. Aorta: The aortic root is normal in size and structure. Pulmonary Artery: The pulmonary artery is of normal size. Venous: The inferior vena cava was not well visualized. IAS/Shunts: The interatrial septum was not well visualized.  LEFT VENTRICLE PLAX 2D LVIDd:         4.60 cm   Diastology LVIDs:         2.60 cm   LV e' medial:    7.40 cm/s LV PW:         1.10 cm   LV E/e' medial:  5.3 LV IVS:        1.00 cm   LV e' lateral:   7.72 cm/s LVOT diam:     2.00 cm   LV E/e' lateral: 5.1 LV SV:         79 LV SV Index:  41 LVOT Area:     3.14 cm  RIGHT VENTRICLE RV Basal diam:  3.35 cm RV Mid diam:    2.90 cm RV S prime:     23.70 cm/s TAPSE (M-mode): 2.2 cm LEFT ATRIUM             Index        RIGHT ATRIUM           Index LA diam:        4.10 cm 2.14 cm/m   RA Area:     13.20 cm LA Vol (A2C):   61.0 ml 31.90 ml/m  RA Volume:   30.70 ml  16.05  ml/m LA Vol (A4C):   57.2 ml 29.91 ml/m LA Biplane Vol: 59.3 ml 31.01 ml/m  AORTIC VALVE                     PULMONIC VALVE AV Area (Vmax):    2.35 cm      PV Vmax:       1.19 m/s AV Area (Vmean):   2.41 cm      PV Peak grad:  5.7 mmHg AV Area (VTI):     2.50 cm AV Vmax:           163.00 cm/s AV Vmean:          103.000 cm/s AV VTI:            0.314 m AV Peak Grad:      10.6 mmHg AV Mean Grad:      5.0 mmHg LVOT Vmax:         122.00 cm/s LVOT Vmean:        79.100 cm/s LVOT VTI:          0.250 m LVOT/AV VTI ratio: 0.80  AORTA Ao Root diam: 3.40 cm MITRAL VALVE MV Area (PHT): 2.77 cm    SHUNTS MV Area VTI:   3.69 cm    Systemic VTI:  0.25 m MV Peak grad:  4.0 mmHg    Systemic Diam: 2.00 cm MV Mean grad:  1.0 mmHg MV Vmax:       1.00 m/s MV Vmean:      49.7 cm/s MV Decel Time: 274 msec MV E velocity: 39.30 cm/s MV A velocity: 69.10 cm/s MV E/A ratio:  0.57 Harrell Gave End MD Electronically signed by Nelva Bush MD Signature Date/Time: 12/12/2022/10:40:23 AM    Final    CT Lumbar Spine Wo Contrast  Result Date: 12/11/2022 CLINICAL DATA:  Follow-up examination for spinal fracture. EXAM: CT LUMBAR SPINE WITHOUT CONTRAST TECHNIQUE: Multidetector CT imaging of the lumbar spine was performed without intravenous contrast administration. Multiplanar CT image reconstructions were also generated. RADIATION DOSE REDUCTION: This exam was performed according to the departmental dose-optimization program which includes automated exposure control, adjustment of the mA and/or kV according to patient size and/or use of iterative reconstruction technique. COMPARISON:  Prior CT from earlier the same day. FINDINGS: Segmentation: Standard. Lowest well-formed disc space labeled the L5-S1 level. Alignment: Mild levoscoliosis. Alignment otherwise normal with preservation of the normal lumbar lordosis. No significant listhesis. Vertebrae: Acute burst type compression fracture of T12 noted, described on corresponding thoracic spine  CT. Otherwise, vertebral body height maintained. Visualized sacrum and pelvis intact. No discrete or worrisome osseous lesions. Subacute healing fractures of the right posterior tenth and eleventh ribs noted. Paraspinal and other soft tissues: Paraspinous edema adjacent to the T12 compression fracture. Hepatic steatosis noted. Aortic atherosclerosis. Few simple right  renal cyst noted, benign in appearance, no follow-up imaging recommended. Disc levels: L1-2: Reactive endplate spurring with minimal annular disc bulge. Mild facet hypertrophy. No significant canal or foraminal stenosis. L2-3: Degenerative intervertebral disc space narrowing with diffuse disc bulge and reactive endplate spurring, asymmetric to the right. Right worse than left facet hypertrophy. Resultant mild canal with bilateral subarticular stenosis. Moderate right with mild left L2 foraminal narrowing. L3-4: Disc bulge with endplate spurring. Moderate facet hypertrophy. Resultant mild-to-moderate spinal stenosis. Mild bilateral L3 foraminal narrowing. L4-5: Degenerative intervertebral disc space narrowing with diffuse disc bulge and reactive endplate spurring, asymmetric to the right. Moderate to advanced right worse than left facet arthrosis. Resultant mild canal with bilateral subarticular stenosis. Moderate bilateral L4 foraminal narrowing. L5-S1: Diffuse disc bulge. Moderate facet hypertrophy. No spinal stenosis. Mild left L5 foraminal narrowing. IMPRESSION: 1. Acute burst type compression fracture of T12, described on corresponding thoracic spine CT. 2. No other acute traumatic injury within the lumbar spine. 3. Subacute healing fractures of the right posterior tenth and eleventh ribs. 4. Moderate multilevel degenerative spondylosis and facet arthrosis as above, resulting in mild-to-moderate spinal stenosis at L2-3 through L4-5. 5. Hepatic steatosis. Aortic Atherosclerosis (ICD10-I70.0). Electronically Signed   By: Jeannine Boga M.D.    On: 12/11/2022 20:42   CT Thoracic Spine Wo Contrast  Result Date: 12/11/2022 CLINICAL DATA:  Initial evaluation for acute spinal fracture. EXAM: CT THORACIC SPINE WITHOUT CONTRAST TECHNIQUE: Multidetector CT images of the thoracic were obtained using the standard protocol without intravenous contrast. RADIATION DOSE REDUCTION: This exam was performed according to the departmental dose-optimization program which includes automated exposure control, adjustment of the mA and/or kV according to patient size and/or use of iterative reconstruction technique. COMPARISON:  Prior CT from earlier the same day. FINDINGS: Alignment: Straightening of the normal thoracic kyphosis. No significant listhesis. Vertebrae: Acute burst type compression fracture involving the T12 vertebral body is seen. Associated height loss measures up to 45% with 6 mm bony retropulsion. No visible extension through the posterior elements. Secondary flattening of the ventral thecal sac with mild to moderate spinal stenosis at this level. Additional subacute fractures of the right posterior ninth, tenth, and eleventh ribs noted. No other acute or recent fracture. Mild chronic compression deformity involving the superior endplates of T2 and T3 without bony retropulsion noted. Vertebral body height otherwise maintained. No worrisome osseous lesions. Paraspinal and other soft tissues: Mild paraspinous edema adjacent to the T12 fracture. Hepatic steatosis noted. Simple right renal cyst partially visualize, benign in appearance, no follow-up imaging recommended. Aortic atherosclerosis. Disc levels: T2-3: Right-sided facet arthrosis with resultant severe right foraminal stenosis. No spinal stenosis. T3-4: Central disc protrusion indents the ventral thecal sac without significant spinal stenosis. Foramina remain patent. T4-5: Small left paracentral disc protrusion without significant stenosis. T5-6: Small left paracentral disc protrusion without  significant stenosis. T11 12: 6 mm bony retropulsion related to the T12 compression fracture. Resultant mild-to-moderate spinal stenosis. Foramina remain patent. Otherwise, no other significant disc pathology seen by CT. IMPRESSION: 1. Acute burst type compression fracture involving the T12 vertebral body with up to 45% height loss and 6 mm bony retropulsion. 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 2. Additional subacute fractures of the right posterior ninth, tenth, and eleventh ribs. 3. Mild chronic compression deformities involving the superior endplates of T2 and T3 without bony retropulsion. 4. 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 5. Hepatic steatosis. Aortic Atherosclerosis (ICD10-I70.0). Electronically Signed   By: Jeannine Boga M.D.   On: 12/11/2022  20:35   CT Angio Chest Pulmonary Embolism (PE) W or WO Contrast  Result Date: 12/11/2022 CLINICAL DATA:  Weak dizzy EXAM: CT ANGIOGRAPHY CHEST WITH CONTRAST TECHNIQUE: Multidetector CT imaging of the chest was performed using the standard protocol during bolus administration of intravenous contrast. Multiplanar CT image reconstructions and MIPs were obtained to evaluate the vascular anatomy. RADIATION DOSE REDUCTION: This exam was performed according to the departmental dose-optimization program which includes automated exposure control, adjustment of the mA and/or kV according to patient size and/or use of iterative reconstruction technique. CONTRAST:  1m OMNIPAQUE IOHEXOL 350 MG/ML SOLN COMPARISON:  None Available. FINDINGS: Cardiovascular: Satisfactory opacification of the pulmonary arteries to the segmental level. No evidence of pulmonary embolism. Normal heart size. No pericardial effusion. Nonaneurysmal aorta. No dissection is seen. Mediastinum/Nodes: Midline trachea. No thyroid mass. No suspicious lymph nodes. Small moderate hiatal hernia. Lungs/Pleura: Heterogeneous ground-glass density in the right upper lobe. No pleural  effusion or pneumothorax Upper Abdomen: No acute abnormality. Musculoskeletal: Partially visualized acute to subacute burst type compression fracture of T12 with about 7 mm retropulsion of posterior vertebral body fracture fragment into the spinal canal. There is mass effect and flattening of the anterior thecal sac. Review of the MIP images confirms the above findings. IMPRESSION: 1. Negative for acute pulmonary embolus or aortic dissection. 2. Heterogeneous ground-glass density in the right upper lobe, suspicious for pneumonia. 3. Partially visualized acute to subacute burst type compression fracture of T12 with about 7 mm retropulsion of posterior vertebral body fracture fragment into the spinal canal. There is mass effect on and flattening of the anterior thecal sac. Critical Value/emergent results were called by telephone at the time of interpretation on 12/11/2022 at 6:36 pm to provider [facility] [place], who verbally acknowledged these results. Aortic Atherosclerosis (ICD10-I70.0). Electronically Signed   By: KDonavan FoilM.D.   On: 12/11/2022 18:36   DG Thoracic Spine 2 View  Result Date: 12/11/2022 CLINICAL DATA:  Fall. EXAM: THORACIC SPINE 2 VIEWS COMPARISON:  None Available. FINDINGS: Moderate compression deformity of T12 vertebral body is noted most consistent with old fracture. No definite acute fracture or spondylolisthesis is noted. Minimal degenerative changes are seen involving the lower thoracic spine. IMPRESSION: Moderate T12 compression deformity is noted most consistent with old fracture. However, if patient is symptomatic in this area, MRI may be performed to evaluate for possible acute fracture. No other definite abnormality is seen. Electronically Signed   By: JMarijo ConceptionM.D.   On: 12/11/2022 17:07   CT Cervical Spine Wo Contrast  Result Date: 12/11/2022 CLINICAL DATA:  Trauma, recent fall EXAM: CT CERVICAL SPINE WITHOUT CONTRAST TECHNIQUE: Multidetector CT imaging of the cervical  spine was performed without intravenous contrast. Multiplanar CT image reconstructions were also generated. RADIATION DOSE REDUCTION: This exam was performed according to the departmental dose-optimization program which includes automated exposure control, adjustment of the mA and/or kV according to patient size and/or use of iterative reconstruction technique. COMPARISON:  05/15/2022 FINDINGS: Alignment: Alignment of posterior margins of vertebral bodies has not changed. There is minimal 1-2 mm anterolisthesis at C3-C4 and C7-T1 levels with no significant change. Skull base and vertebrae: No recent fracture is seen. Degenerative changes are noted. Soft tissues and spinal canal: There is extrinsic pressure over the ventral margin of thecal sac caused by posterior bony spurs from C4-C7 levels. Disc levels: There is encroachment of neural foramina from C3 to T1 levels. Upper chest: Unremarkable. Other: None. IMPRESSION: No recent fracture is seen in cervical  spine. 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 No significant interval changes are noted. Electronically Signed   By: Elmer Picker M.D.   On: 12/11/2022 10:43   CT HEAD WO CONTRAST (5MM)  Result Date: 12/11/2022 CLINICAL DATA:  Trauma EXAM: CT HEAD WITHOUT CONTRAST TECHNIQUE: Contiguous axial images were obtained from the base of the skull through the vertex without intravenous contrast. RADIATION DOSE REDUCTION: This exam was performed according to the departmental dose-optimization program which includes automated exposure control, adjustment of the mA and/or kV according to patient size and/or use of iterative reconstruction technique. COMPARISON:  CT brain done on 05/15/2022 FINDINGS: Brain: No acute intracranial findings are seen. There are no signs of bleeding within the cranium. Cortical sulci are prominent. There is decreased density in periventricular white matter. There is no focal edema or mass effect.  Vascular: Unremarkable. Skull: No fracture is seen in calvarium. Sinuses/Orbits: No acute findings are seen. Other: None. IMPRESSION: No acute intracranial findings are seen in noncontrast CT brain. Atrophy. Small-vessel disease. Electronically Signed   By: Elmer Picker M.D.   On: 12/11/2022 10:38   DG Hip Unilat W or Wo Pelvis 2-3 Views Left  Result Date: 12/11/2022 CLINICAL DATA:  Fall a week ago. Hematoma to the left hip and complains of left hip pain. Fall again today. EXAM: DG HIP (WITH OR WITHOUT PELVIS) 2-3V LEFT COMPARISON:  None Available. FINDINGS: There is no evidence of hip fracture or dislocation. Superolateral hip joint space narrowing with acetabular lip osteophytes. Marked soft tissue swelling of the left gluteal region. IMPRESSION: 1. No acute fracture or dislocation. 2. Marked soft tissue swelling of the left gluteal region. Electronically Signed   By: Keane Police D.O.   On: 12/11/2022 10:22    Disposition Plan & Communication  Patient status: Inpatient  Admitted From: Home Planned disposition location: Home Anticipated discharge date: 1/11 pending cardiology clearance   Family Communication: none at bedside    Author: Richarda Osmond, DO [facility] 12/13/2022, 7:28 AM   Available by Epic secure chat 7AM-7PM. If 7PM-7AM, please contact night-coverage.  [facility] contact information found on [email or web address].

## 2022-12-13 NOTE — Progress Notes (Signed)
Progress Note  Patient Name: Caroline Zamora Date of Encounter: 12/13/2022  Primary Cardiologist: Caryl Comes  Subjective   She developed Afib/flutter this morning at [phone] with rates in the low 100s to 140s bpm. BP in the 559R to 416L systolic. Renal function improving. No chest pain, dyspnea, dizziness, presyncope, or syncope. Not aware she was back in Afib with RVR.   Inpatient Medications    Scheduled Meds:  amiodarone  200 mg Oral Daily   apixaban  5 mg Oral BID   carvedilol  3.125 mg Oral BID WC   estradiol  0.5 mg Oral QHS   sodium chloride flush  3 mL Intravenous Q12H   venlafaxine XR  150 mg Oral Daily   Continuous Infusions:  0.9 % NaCl with KCl 20 mEq / L 75 mL/hr at 12/12/22 2126   cefTRIAXone (ROCEPHIN)  IV 2 g (12/12/22 2259)   doxycycline (VIBRAMYCIN) IV 100 mg (12/12/22 2144)   PRN Meds: hydrOXYzine, ondansetron **OR** ondansetron (ZOFRAN) IV   Vital Signs    Vitals:   12/12/22 0733 12/12/22 1602 12/12/22 2132 12/13/22 0439  BP: (!) 141/67 (!) 138/112 (!) 155/70 (!) 152/62  Pulse: 76 80 80 80  Resp: '16  20 20  '$ Temp: 98 F (36.7 C) 97.9 F (36.6 C) 97.9 F (36.6 C) 98 F (36.7 C)  TempSrc: Oral Oral  Oral  SpO2: 98% 91% 99% 100%  Weight:      Height:        Intake/Output Summary (Last 24 hours) at 12/13/2022 0756 Last data filed at 12/12/2022 1829 Gross per 24 hour  Intake 1029.39 ml  Output --  Net 1029.39 ml   Filed Weights   12/11/22 0919  Weight: 81.6 kg    Telemetry    Afib/flutter with RVR developing at 06:43 this morning with rates in the low 100s to 140s bpm - Personally Reviewed  ECG    No new tracings - Personally Reviewed  Physical Exam   GEN: No acute distress.   Neck: No JVD. Cardiac: Tachycardic, IRIR, no murmurs, rubs, or gallops.  Respiratory: Clear to auscultation bilaterally.  GI: Soft, nontender, non-distended.   MS: No edema; No deformity. Neuro:  Alert and oriented x 3; Nonfocal.  Psych: Normal affect.  Labs     Chemistry Recent Labs  Lab 12/11/22 [id number] 12/12/22 0338 12/13/22 0427  NA 133* 133* 135  K 3.1* 3.9 4.0  CL 97* 100 105  CO2 '22 22 23  '$ GLUCOSE 188* 99 100*  BUN 17 27* 27*  CREATININE 1.33* 1.89* 1.05*  CALCIUM 8.2* 7.6* 7.3*  PROT  --  6.2*  --   ALBUMIN  --  2.9*  --   AST  --  22  --   ALT  --  22  --   ALKPHOS  --  64  --   BILITOT  --  0.9  --   GFRNONAA 41* 27* 54*  ANIONGAP '14 11 7     '$ Hematology Recent Labs  Lab 12/11/22 0923 12/12/22 0338 12/13/22 0427  WBC 12.2* 9.7 6.7  RBC 4.81 3.94 3.35*  HGB 14.7 12.1 10.2*  HCT 45.0 37.5 31.4*  MCV 93.6 95.2 93.7  MCH 30.6 30.7 30.4  MCHC 32.7 32.3 32.5  RDW 15.4 15.8* 15.4  PLT 339 241 207    Cardiac EnzymesNo results for input(s): "TROPONINI" in the last 168 hours. No results for input(s): "TROPIPOC" in the last 168 hours.   BNPNo results for input(s): "BNP", "  PROBNP" in the last 168 hours.   DDimer  Recent Labs  Lab 12/11/22 1713  DDIMER 2.40*     Radiology   CT Lumbar Spine Wo Contrast  Result Date: 12/11/2022 IMPRESSION: 1. Acute burst type compression fracture of T12, described on corresponding thoracic spine CT. 2. No other acute traumatic injury within the lumbar spine. 3. Subacute healing fractures of the right posterior tenth and eleventh ribs. 4. Moderate multilevel degenerative spondylosis and facet arthrosis as above, resulting in mild-to-moderate spinal stenosis at L2-3 through L4-5. 5. Hepatic steatosis. Aortic Atherosclerosis (ICD10-I70.0). Electronically Signed   By: Jeannine Boga M.D.   On: 12/11/2022 20:42   CT Thoracic Spine Wo Contrast  Result Date: 12/11/2022 IMPRESSION: 1. Acute burst type compression fracture involving the T12 vertebral body with up to 45% height loss and 6 mm bony retropulsion. 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 2. Additional subacute fractures of the right posterior ninth, tenth, and eleventh ribs. 3. Mild chronic compression  deformities involving the superior endplates of T2 and T3 without bony retropulsion. 4. 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 5. Hepatic steatosis. Aortic Atherosclerosis (ICD10-I70.0). Electronically Signed   By: Jeannine Boga M.D.   On: 12/11/2022 20:35   CT Angio Chest Pulmonary Embolism (PE) W or WO Contrast  Result Date: 12/11/2022 IMPRESSION: 1. Negative for acute pulmonary embolus or aortic dissection. 2. Heterogeneous ground-glass density in the right upper lobe, suspicious for pneumonia. 3. Partially visualized acute to subacute burst type compression fracture of T12 with about 7 mm retropulsion of posterior vertebral body fracture fragment into the spinal canal. There is mass effect on and flattening of the anterior thecal sac. Critical Value/emergent results were called by telephone at the time of interpretation on 12/11/2022 at 6:36 pm to provider [facility] , who verbally acknowledged these results. Aortic Atherosclerosis (ICD10-I70.0). Electronically Signed   By: Donavan Foil M.D.   On: 12/11/2022 18:36   DG Thoracic Spine 2 View  Result Date: 12/11/2022 IMPRESSION: Moderate T12 compression deformity is noted most consistent with old fracture. However, if patient is symptomatic in this area, MRI may be performed to evaluate for possible acute fracture. No other definite abnormality is seen. Electronically Signed   By: Marijo Conception M.D.   On: 12/11/2022 17:07   CT Cervical Spine Wo Contrast  Result Date: 12/11/2022 IMPRESSION: No recent fracture is seen in cervical spine. 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 No significant interval changes are noted. Electronically Signed   By: Elmer Picker M.D.   On: 12/11/2022 10:43   CT HEAD WO CONTRAST (5MM)  Result Date: 12/11/2022 IMPRESSION: No acute intracranial findings are seen in noncontrast CT brain. Atrophy. Small-vessel disease. Electronically Signed   By: Elmer Picker M.D.    On: 12/11/2022 10:38   DG Hip Unilat W or Wo Pelvis 2-3 Views Left  Result Date: 12/11/2022 IMPRESSION: 1. No acute fracture or dislocation. 2. Marked soft tissue swelling of the left gluteal region. Electronically Signed   By: Keane Police D.O.   On: 12/11/2022 10:22    Cardiac Studies   2D echo 12/12/2022: 1. Left ventricular ejection fraction, by estimation, is 55 to 60%. The  left ventricle has normal function. The left ventricle has no regional  wall motion abnormalities. There is mild left ventricular hypertrophy.  Left ventricular diastolic parameters  are consistent with Grade I diastolic dysfunction (impaired relaxation).   2. Right ventricular systolic function is normal. The right ventricular  size is normal.   3. The mitral valve is normal in structure. Trivial mitral valve  regurgitation. No evidence of mitral stenosis.   4. The aortic valve is tricuspid. Aortic valve regurgitation is not  visualized. No aortic stenosis is present.  __________   Elwyn Reach patch 04/2022: Findings HR  avg 76-82  Min 56-62-Max 132-200      Patch Wear Time:  14 days and 22 hours (2023-05-17T12:01:29-0400 to 2023-06-06T17:02:06-399)   Monitor 1 Patient had a min HR of 62 bpm, max HR of 132 bpm, and avg HR of 76 bpm. Predominant underlying rhythm was Sinus Rhythm. Bundle Branch Block/IVCD was present. Atrial Fibrillation occurred (2% burden), ranging from 90-132 bpm (avg of 108 bpm), the longest  lasting 31 mins 8 secs with an avg rate of 108 bpm. Isolated SVEs were rare (<1.0%), SVE Couplets were rare (<1.0%), and SVE Triplets were rare (<1.0%). Isolated VEs were occasional (4.5%, 2506), VE Couplets were rare (<1.0%, 66), and no VE Triplets  were present. Ventricular Bigeminy was present.   Monitor 2 Patient had a min HR of 56 bpm, max HR of 200 bpm, and avg HR of 82 bpm. Predominant underlying rhythm was Sinus Rhythm. Bundle Branch Block/IVCD was present. 10 Supraventricular Tachycardia runs  occurred, the run with the fastest interval lasting 11.3  secs with a max rate of 174 bpm (avg 145 bpm); the run with the fastest interval was also the longest. Atrial Fibrillation/Flutter occurred (21% burden), ranging from 73-200 bpm (avg of 112 bpm), the longest lasting 1 day 7 hours with an avg rate of 110  bpm. Isolated SVEs were rare (<1.0%), SVE Couplets were rare (<1.0%), and SVE Triplets were rare (<1.0%). Isolated VEs were rare (<1.0%), VE Couplets were rare (<1.0%), and no VE Triplets were present. Ventricular Bigeminy was present. Previoulsy  notified -- this is a replacement device __________   cMRI 04/18/2022: IMPRESSION: 1.  Normal LV size with mild systolic dysfunction (EF 52%) 2.  Normal RV size and systolic function (EF 77%) 3.  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  She has a low LVEDP of 4.  She was in sinus rhythm at the beginning of the case and went into a drip/flutter with RVR at the end of the case suggesting that her syncope may be arrhythmogenic in nature.  I have communicated these results to Dr. Lars Mage, electrophysiology, who arranged the heart cath.  The sheath was removed and a TR band was placed on the right wrist to achieve patent hemostasis.  The patient left lab in stable condition.  Heparin will be started 4 hours after sheath removal.  __________   2D echo 04/10/2022: 1. Left ventricular ejection fraction, by estimation, is 35 to 40%. The  left ventricle has mild to moderately decreased function. The left  ventricle demonstrates global hypokinesis. Left ventricular diastolic  parameters are consistent with Grade I  diastolic dysfunction (impaired relaxation).   2. Right ventricular systolic function is normal. The right ventricular  size is normal.   3. Left atrial size was mildly dilated.   4. The mitral  valve is normal in structure. Mild mitral valve  regurgitation.   5. The aortic valve is tricuspid. Aortic valve regurgitation is not  visualized.   6. The inferior vena cava is normal in size with greater than 50%  respiratory variability, suggesting right atrial pressure  of 3 mmHg.   Comparison(s): Echo performed at [facility]: LVEF 35%, Mod MR.  __________   Carlton Adam MPI 12/14/2021: 1.  Normal left ventricular function  2.  Normal wall motion  3.  No evidence for scar or ischemia  __________   2D echo 12/06/2021 Jefm Bryant): MODERATE LV SYSTOLIC DYSFUNCTION (See above)  NORMAL RIGHT VENTRICULAR SYSTOLIC FUNCTION  NO VALVULAR STENOSIS  MODERATE MR  TRIVIAL TR  MILD PR  EF 35%  __________   2D echo 12/27/2016 Jefm Bryant): NORMAL LEFT VENTRICULAR SYSTOLIC FUNCTION   WITH MILD LVH  NORMAL RIGHT VENTRICULAR SYSTOLIC FUNCTION  MILD VALVULAR REGURGITATION (See above)  NO VALVULAR STENOSIS  MILD to MODERATE MR  EF 50%   Patient Profile     82 y.o. female with history of HFrEF with subsequent normalization of LV systolic function by echo this admission, PAF, syncope felt to be orthostatic in etiology during admission in 04/2022, HTN, and HLD who is being seen today for the evaluation of syncope at the request of Dr. Ouida Sills.   Assessment & Plan    1. 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 PAF/flutter: -She developed Afib/flutter with RVR at [phone] this morning -Reload with amiodarone 400 mg bid x 1 week followed by 200 mg bid x 1 week followed by 200 mg daily thereafter  -Titration of Coreg is not ideal given history of orthostatic syncope -She has not missed any doses of OAC -A-fib/flutter by itself should not lead to syncope -Prior cardiac monitoring has shown no evidence of significant posttermination pauses -Monitor on telemetry -Follow-up with EP for consideration of loop recorder -Continue PTA carvedilol and apixaban   3. 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 NICM: -She appears euvolemic and well compensated -Echo this admission showed normalization of LV systolic function -She remains on carvedilol -Not requiring a standing loop diuretic   5. AKI: -Improved -Continue hydration   6. Hypokalemia: -Repleted     For questions or updates, please contact [place] Please consult [email or web address] for contact info under Cardiology/STEMI.    Signed, Christell Faith, PA-C [facility] Pager: [phone] 12/13/2022, 7:56 AM

## 2022-12-13 NOTE — Progress Notes (Signed)
Physical Therapy Treatment Patient Details Name: Caroline Zamora MRN: [id number] DOB: 1941-10-31 Today's Date: 12/13/2022   History of Present Illness 82 y/o female presented to ED on 12/11/21 for dizziness and weakness associated with fall. CT head negative. CTA showed R upper lobe concerning for pneumonia. 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 PMH: Afib, HTN, CKD, depression, migraines, and arthritis.    PT Comments    Patient progressing towards physical therapy goals. Denies dizziness or lightheadedness throughout session. Only complaint was pain in back and R LE. Ambulated 200' with supervision and RW. Encouraged use of RW until patient feels more stable and less pain in R LE. D/c plan remains appropriate.     Recommendations for follow up therapy are one component of a multi-disciplinary discharge planning process, led by the attending physician.  Recommendations may be updated based on patient status, additional functional criteria and insurance authorization.  Follow Up Recommendations  No PT follow up     Assistance Recommended at Discharge Intermittent Supervision/Assistance  Patient can return home with the following A little help with walking and/or transfers;A little help with bathing/dressing/bathroom;Assist for transportation;Help with stairs or ramp for entrance   Equipment Recommendations  None recommended by PT    Recommendations for Other Services       Precautions / Restrictions Precautions Precautions: Fall Restrictions Weight Bearing Restrictions: No     Mobility  Bed Mobility Overal bed mobility: Modified Independent                  Transfers Overall transfer level: Needs assistance Equipment used: Rolling Tysheka Fanguy (2 wheels) Transfers: Sit to/from Stand Sit to Stand: Supervision           General transfer comment: supervision for safety    Ambulation/Gait Ambulation/Gait  assistance: Supervision Gait Distance (Feet): 200 Feet Assistive device: Rolling Quynn Vilchis (2 wheels) Gait Pattern/deviations: Step-through pattern, Decreased stride length Gait velocity: decreased     General Gait Details: supervision for safety. Decreased stance time on R due to pain   Stairs             Wheelchair Mobility    Modified Rankin (Stroke Patients Only)       Balance Overall balance assessment: Mild deficits observed, not formally tested, History of Falls                                          Cognition Arousal/Alertness: Awake/alert Behavior During Therapy: WFL for tasks assessed/performed Overall Cognitive Status: Within Functional Limits for tasks assessed                                          Exercises      General Comments        Pertinent Vitals/Pain Pain Assessment Pain Assessment: Faces Faces Pain Scale: Hurts little more Pain Location: back Pain Descriptors / Indicators: Discomfort, Grimacing, Guarding Pain Intervention(s): Monitored during session    Home Living                          Prior Function            PT Goals (current goals can now be found in the care plan section) Acute  Rehab PT Goals Patient Stated Goal: to feel better and rest PT Goal Formulation: With patient Time For Goal Achievement: 12/26/22 Potential to Achieve Goals: Good Progress towards PT goals: Progressing toward goals    Frequency    Min 2X/week      PT Plan Current plan remains appropriate    Co-evaluation              AM-PAC PT "6 Clicks" Mobility   Outcome Measure  Help needed turning from your back to your side while in a flat bed without using bedrails?: None Help needed moving from lying on your back to sitting on the side of a flat bed without using bedrails?: None Help needed moving to and from a bed to a chair (including a wheelchair)?: A Little Help needed standing up  from a chair using your arms (e.g., wheelchair or bedside chair)?: A Little Help needed to walk in hospital room?: A Little Help needed climbing 3-5 steps with a railing? : A Little 6 Click Score: 20    End of Session   Activity Tolerance: Patient tolerated treatment well Patient left: in bed;with call bell/phone within reach Nurse Communication: Mobility status PT Visit Diagnosis: Muscle weakness (generalized) (M62.81);Unsteadiness on feet (R26.81);Repeated falls (R29.6)     Time: 5945-8592 PT Time Calculation (min) (ACUTE ONLY): 16 min  Charges:  $Therapeutic Activity: 8-22 mins                     Amariah Kierstead A. Gilford Rile PT, DPT [facility] - Acute Rehabilitation Services    Lataunya Ruud A Sanayah Munro 12/13/2022, 4:24 PM

## 2022-12-14 ENCOUNTER — Other Ambulatory Visit: Payer: Self-pay | Admitting: Internal Medicine

## 2022-12-14 DIAGNOSIS — I480 Paroxysmal atrial fibrillation: Secondary | ICD-10-CM | POA: Diagnosis not present

## 2022-12-14 DIAGNOSIS — I951 Orthostatic hypotension: Secondary | ICD-10-CM

## 2022-12-14 DIAGNOSIS — R531 Weakness: Secondary | ICD-10-CM | POA: Diagnosis not present

## 2022-12-14 DIAGNOSIS — W19XXXA Unspecified fall, initial encounter: Secondary | ICD-10-CM | POA: Diagnosis not present

## 2022-12-14 DIAGNOSIS — E876 Hypokalemia: Secondary | ICD-10-CM | POA: Diagnosis not present

## 2022-12-14 DIAGNOSIS — R9431 Abnormal electrocardiogram [ECG] [EKG]: Secondary | ICD-10-CM | POA: Diagnosis not present

## 2022-12-14 DIAGNOSIS — R55 Syncope and collapse: Secondary | ICD-10-CM | POA: Diagnosis not present

## 2022-12-14 DIAGNOSIS — E1159 Type 2 diabetes mellitus with other circulatory complications: Secondary | ICD-10-CM

## 2022-12-14 LAB — BASIC METABOLIC PANEL
Anion gap: 8 (ref 5–15)
BUN: 19 mg/dL (ref 8–23)
CO2: 22 mmol/L (ref 22–32)
Calcium: 7.3 mg/dL — ABNORMAL LOW (ref 8.9–10.3)
Chloride: 106 mmol/L (ref 98–111)
Creatinine, Ser: 0.86 mg/dL (ref 0.44–1.00)
GFR, Estimated: >60 mL/min (ref >60)
Glucose, Bld: 91 mg/dL (ref 70–99)
Potassium: 3.8 mmol/L (ref 3.5–5.1)
Sodium: 136 mmol/L (ref 135–145)

## 2022-12-14 LAB — CBC
HCT: 30.9 % — ABNORMAL LOW (ref 36.0–46.0)
Hemoglobin: 10.2 g/dL — ABNORMAL LOW (ref 12.0–15.0)
MCH: 30.8 pg (ref 26.0–34.0)
MCHC: 33 g/dL (ref 30.0–36.0)
MCV: 93.4 fL (ref 80.0–100.0)
Platelets: 221 10*3/uL (ref 150–400)
RBC: 3.31 MIL/uL — ABNORMAL LOW (ref 3.87–5.11)
RDW: 15.3 % (ref 11.5–15.5)
WBC: 6.4 10*3/uL (ref 4.0–10.5)
nRBC: 0 % (ref 0.0–0.2)

## 2022-12-14 LAB — GLUCOSE, CAPILLARY
Glucose-Capillary: 100 mg/dL — ABNORMAL HIGH (ref 70–99)
Glucose-Capillary: 90 mg/dL (ref 70–99)

## 2022-12-14 MED ORDER — DOXYCYCLINE HYCLATE 50 MG PO CAPS: 50.0000 mg | ORAL_CAPSULE | Freq: Two times a day (BID) | ORAL | 0 refills | Status: AC

## 2022-12-14 MED ORDER — CARVEDILOL 3.125 MG PO TABS: 3.1250 mg | ORAL_TABLET | Freq: Two times a day (BID) | ORAL | 0 refills | Status: DC

## 2022-12-14 MED ORDER — AMIODARONE HCL 200 MG PO TABS: ORAL_TABLET | ORAL | 0 refills | Status: DC

## 2022-12-14 NOTE — TOC CM/SW Note (Signed)
  Transition of Care [facility]) Screening Note   Patient Details  Name: Caroline Zamora Date of Birth: [birth year]   Transition of Care [facility]) CM/SW Contact:    Candie Chroman, LCSW Phone Number: 12/14/2022, 9:59 AM    Transition of Care Department [facility]) has reviewed patient and no TOC needs have been identified at this time. We will continue to monitor patient advancement through interdisciplinary progression rounds. If new patient transition needs arise, please place a TOC consult.

## 2022-12-14 NOTE — Progress Notes (Incomplete)
PROGRESS NOTE  Caroline Zamora    DOB: [birth year], 82 y.o.  NKN:[id number]    Code Status: Full Code   DOA: 12/11/2022   LOS: 2   Brief hospital course  Caroline Zamora is a 82 y.o. female with a PMH significant for atrial fibrillation, recurring syncope, hypertension, hyperlipidemia presenting with syncope, prolonged QT and community-acquired pneumonia, fall.  They presented from home to the ED on 12/11/2022 with syncope. Patient noted to have been admitted back in June for similar symptoms associated with syncope. Had a fairly extensive cardiac workup with plan for abdominal binder and compression stockings.   In the ED, it was found that they had afebrile, hemodynamically stable. Satting well on room air.  Significant findings included White count 12.2, hemoglobin 15.  Creatinine 1.33.  Troponin in the 20s.  COVID-negative.  D-dimer 2.4.  CT head stable.  CT angio negative for PE but does show right upper lobe opacity concerning for pneumonia.  Noted subacute burst type compression fracture at T12 with up to 45% height loss.Marland Kitchen  Positive chronic compression deformities involving T2 and T 3.  They were initially treated with amiodarone, algesia, potassium, azithromycin, CTX for PNA.   Patient was admitted to medicine service for further workup and management of syncope, PNA, AKI as outlined in detail below.  12/14/22 -stable  Assessment & Plan  Principal Problem:   Syncope Active Problems:   Recurrent syncope   CAP (community acquired pneumonia)   A-fib (HCC)   HFrEF (heart failure with reduced ejection fraction) (HCC)   Elevated troponin   Prolonged QT interval   Type 2 diabetes mellitus (HCC)   Fall   Hypokalemia   Atrial fibrillation, chronic (HCC)   Prolonged Q-T interval on ECG   Orthostasis   Anxiety attack   Paroxysmal atrial fibrillation with RVR (HCC)  Recurrent syncope- multifactorial including medication effect, orthostatic in setting of acute PNA. Recurring issue  with noted prior admission June 2023 with extensive cardiac workup Suspect possible decompensation in setting of worsening SOB over past 1-2 days w/ RUL PNA on imaging. Negative PE. Hemodynamically stable.  She will need fine adjustments of her medications to account for positive orthostatics as well as control of her afib.  - Cardiology consulted, appreciate recs - f/u echo results - repeat orthostatic vitals.  - repeat trops - consider seizure workup  Afib RVR- currently rate controlled. Elevated to 130 overnight resolved with amiodarone - cardiology managing, appreciate your care - continue Eliquis  - Telemetry monitoring  - Follow closely  AKI- Cr 1.89 from normal baseline. Improving. Cr 1.89>1.05 - BMP am  Hypokalemia- resolved s/p replacement - monitor and replete PRN   CAP Worsening SOB and decompensated syncope w/ noted RUL infiltrate on imaging  WBC 12 No hypoxia - azithromycin changed to doxycycline for QTC prolongation  - continue CTX   Fall Recurring issue in the setting of recurrent syncope and collapse at home Imaging stable apart from noted subacute T12 compression fracture Fall precautions - PT OT evaluation   Type 2 diabetes mellitus ([place])- A1c on admission 5.6. not insulin dependent at baseline.  - discontinue SSI   Prolonged QT interval QTc 530s on presentation w/ acute on chronic syncope  Titrate home regimen pending  re-evaluation by cardiology  Follow closely   HFrEF- previous EF 35-40% in 5/23. repeat echo shows overall improvement. E 55-60%.  Euvolemic  Cont home regimen  Strict Is and Os and daily weights    Body mass index  is 29.05 kg/m.  VTE ppx: SCDs Start: 12/11/22 2138 apixaban (ELIQUIS) tablet 5 mg   Diet:     Diet   Diet Carb Modified Fluid consistency: Thin; Room service appropriate? Yes   Consultants: Cardiology   Subjective 12/14/22    Pt reports no chest pain. She had episode of afib RVR this morning/overnight which  caused her distress but has been resolved currently at time of interview. Denies SOB.  Able to ambulate in room without issue.    Objective   Vitals:   12/13/22 1643 12/13/22 1807 12/13/22 1923 12/14/22 0344  BP: 132/70 (!) 162/69 (!) 142/71 (!) 140/60  Pulse: 82 75 72 73  Resp: '16  16 20  '$ Temp: 98 F (36.7 C)  98.4 F (36.9 C) 98 F (36.7 C)  TempSrc: Oral     SpO2: 100%  100% 98%  Weight:      Height:        Intake/Output Summary (Last 24 hours) at 12/14/2022 0727 Last data filed at 12/13/2022 1853 Gross per 24 hour  Intake 2244.87 ml  Output --  Net 2244.87 ml    Filed Weights   12/11/22 0919  Weight: 81.6 kg     Physical Exam:  General: awake, alert, NAD HEENT: atraumatic, clear conjunctiva, anicteric sclera, MMM, hearing grossly normal Respiratory: normal respiratory effort. Cardiovascular: quick capillary refill, normal S1/S2, RRR, no JVD, murmurs Nervous: A&O x3. no gross focal neurologic deficits, normal speech Extremities: moves all equally, no edema, normal tone Skin: dry, intact, normal temperature, normal color. No rashes, lesions or ulcers on exposed skin Psychiatry: normal mood, congruent affect  Labs   I have personally reviewed the following labs and imaging studies CBC    Component Value Date/Time   WBC 6.4 12/14/2022 0433   RBC 3.31 (L) 12/14/2022 0433   HGB 10.2 (L) 12/14/2022 0433   HCT 30.9 (L) 12/14/2022 0433   PLT 221 12/14/2022 0433   MCV 93.4 12/14/2022 0433   MCH 30.8 12/14/2022 0433   MCHC 33.0 12/14/2022 0433   RDW 15.3 12/14/2022 0433   LYMPHSABS 2.4 12/12/2022 0338   MONOABS 1.2 (H) 12/12/2022 0338   EOSABS 0.1 12/12/2022 0338   BASOSABS 0.0 12/12/2022 0338      Latest Ref Rng & Units 12/14/2022    4:33 AM 12/13/2022    4:27 AM 12/12/2022    3:38 AM  BMP  Glucose 70 - 99 mg/dL 91  100  99   BUN 8 - 23 mg/dL '19  27  27   '$ Creatinine 0.44 - 1.00 mg/dL 0.86  1.05  1.89   Sodium 135 - 145 mmol/L 136  135  133   Potassium 3.5 -  5.1 mmol/L 3.8  4.0  3.9   Chloride 98 - 111 mmol/L 106  105  100   CO2 22 - 32 mmol/L '22  23  22   '$ Calcium 8.9 - 10.3 mg/dL 7.3  7.3  7.6     ECHOCARDIOGRAM COMPLETE  Result Date: 12/12/2022    ECHOCARDIOGRAM REPORT   Patient Name:   Caroline Zamora Date of Exam: 12/12/2022 Medical Rec #:  [id number]         Height:       66.0 in Accession #:    [id number]        Weight:       180.0 lb Date of Birth:  [birth year]        BSA:  1.912 m Patient Age:    61 years          BP:           141/67 mmHg Patient Gender: F                 HR:           79 bpm. Exam Location:  [facility] Procedure: 2D Echo, Cardiac Doppler and Color Doppler Indications:     Syncope  History:         Patient has prior history of Echocardiogram examinations, most                  recent 04/10/2022. CHF, Arrythmias:Atrial Fibrillation,                  Signs/Symptoms:Syncope; Risk Factors:Diabetes and Non-Smoker.  Sonographer:     Wenda Low Referring Phys:  [place] Diagnosing Phys: Nelva Bush MD IMPRESSIONS  1. Left ventricular ejection fraction, by estimation, is 55 to 60%. The left ventricle has normal function. The left ventricle has no regional wall motion abnormalities. There is mild left ventricular hypertrophy. Left ventricular diastolic parameters are consistent with Grade I diastolic dysfunction (impaired relaxation).  2. Right ventricular systolic function is normal. The right ventricular size is normal.  3. The mitral valve is normal in structure. Trivial mitral valve regurgitation. No evidence of mitral stenosis.  4. The aortic valve is tricuspid. Aortic valve regurgitation is not visualized. No aortic stenosis is present. FINDINGS  Left Ventricle: Left ventricular ejection fraction, by estimation, is 55 to 60%. The left ventricle has normal function. The left ventricle has no regional wall motion abnormalities. The left ventricular internal cavity size was normal in size. There is  mild left ventricular  hypertrophy. Left ventricular diastolic parameters are consistent with Grade I diastolic dysfunction (impaired relaxation). Right Ventricle: The right ventricular size is normal. No increase in right ventricular wall thickness. Right ventricular systolic function is normal. Left Atrium: Left atrial size was normal in size. Right Atrium: Right atrial size was normal in size. Pericardium: There is no evidence of pericardial effusion. Mitral Valve: The mitral valve is normal in structure. Trivial mitral valve regurgitation. No evidence of mitral valve stenosis. MV peak gradient, 4.0 mmHg. The mean mitral valve gradient is 1.0 mmHg. Tricuspid Valve: The tricuspid valve is not well visualized. Tricuspid valve regurgitation is trivial. Aortic Valve: The aortic valve is tricuspid. Aortic valve regurgitation is not visualized. No aortic stenosis is present. Aortic valve mean gradient measures 5.0 mmHg. Aortic valve peak gradient measures 10.6 mmHg. Aortic valve area, by VTI measures 2.50  cm. Pulmonic Valve: The pulmonic valve was not well visualized. Pulmonic valve regurgitation is mild. No evidence of pulmonic stenosis. Aorta: The aortic root is normal in size and structure. Pulmonary Artery: The pulmonary artery is of normal size. Venous: The inferior vena cava was not well visualized. IAS/Shunts: The interatrial septum was not well visualized.  LEFT VENTRICLE PLAX 2D LVIDd:         4.60 cm   Diastology LVIDs:         2.60 cm   LV e' medial:    7.40 cm/s LV PW:         1.10 cm   LV E/e' medial:  5.3 LV IVS:        1.00 cm   LV e' lateral:   7.72 cm/s LVOT diam:     2.00 cm   LV E/e'  lateral: 5.1 LV SV:         79 LV SV Index:   41 LVOT Area:     3.14 cm  RIGHT VENTRICLE RV Basal diam:  3.35 cm RV Mid diam:    2.90 cm RV S prime:     23.70 cm/s TAPSE (M-mode): 2.2 cm LEFT ATRIUM             Index        RIGHT ATRIUM           Index LA diam:        4.10 cm 2.14 cm/m   RA Area:     13.20 cm LA Vol (A2C):   61.0 ml 31.90  ml/m  RA Volume:   30.70 ml  16.05 ml/m LA Vol (A4C):   57.2 ml 29.91 ml/m LA Biplane Vol: 59.3 ml 31.01 ml/m  AORTIC VALVE                     PULMONIC VALVE AV Area (Vmax):    2.35 cm      PV Vmax:       1.19 m/s AV Area (Vmean):   2.41 cm      PV Peak grad:  5.7 mmHg AV Area (VTI):     2.50 cm AV Vmax:           163.00 cm/s AV Vmean:          103.000 cm/s AV VTI:            0.314 m AV Peak Grad:      10.6 mmHg AV Mean Grad:      5.0 mmHg LVOT Vmax:         122.00 cm/s LVOT Vmean:        79.100 cm/s LVOT VTI:          0.250 m LVOT/AV VTI ratio: 0.80  AORTA Ao Root diam: 3.40 cm MITRAL VALVE MV Area (PHT): 2.77 cm    SHUNTS MV Area VTI:   3.69 cm    Systemic VTI:  0.25 m MV Peak grad:  4.0 mmHg    Systemic Diam: 2.00 cm MV Mean grad:  1.0 mmHg MV Vmax:       1.00 m/s MV Vmean:      49.7 cm/s MV Decel Time: 274 msec MV E velocity: 39.30 cm/s MV A velocity: 69.10 cm/s MV E/A ratio:  0.57 Harrell Gave End MD Electronically signed by Nelva Bush MD Signature Date/Time: 12/12/2022/10:40:23 AM    Final     Disposition Plan & Communication  Patient status: Inpatient  Admitted From: Home Planned disposition location: Home Anticipated discharge date: 1/11 pending cardiology clearance   Family Communication: none at bedside    Author: Richarda Osmond, DO [facility] 12/14/2022, 7:27 AM   Available by Epic secure chat 7AM-7PM. If 7PM-7AM, please contact night-coverage.  [facility] contact information found on [email or web address].

## 2022-12-14 NOTE — Discharge Instructions (Signed)
Please follow up with cardiology within 2 weeks.  Try to take an extended amount of time from laying to sitting and to standing before you move.  Continue to take the medications as listed.  Your amiodarone will be tapered down over the next few weeks.

## 2022-12-14 NOTE — Progress Notes (Signed)
Rounding Note    Patient Name: Caroline Zamora Date of Encounter: 12/14/2022  [place] Cardiologist: Marzetta Board  Subjective   Atrial fibrillation yesterday morning, converting 10:40 AM, maintaining normal sinus rhythm since that time Tolerating higher dose amiodarone 400 twice daily Denies significant cough, shortness of breath concerning for pneumonia No fever or general malaise Main reason for presentation was weakness, dizziness, falls  Inpatient Medications    Scheduled Meds:  amiodarone  400 mg Oral BID   apixaban  5 mg Oral BID   carvedilol  3.125 mg Oral BID WC   estradiol  0.5 mg Oral QHS   levothyroxine  50 mcg Oral Q0600   sodium chloride flush  3 mL Intravenous Q12H   venlafaxine XR  150 mg Oral Daily   Continuous Infusions:  0.9 % NaCl with KCl 20 mEq / L 75 mL/hr at 12/13/22 1433   cefTRIAXone (ROCEPHIN)  IV 2 g (12/14/22 0001)   doxycycline (VIBRAMYCIN) IV 100 mg (12/14/22 0945)   PRN Meds: acetaminophen, hydrOXYzine, ondansetron **OR** ondansetron (ZOFRAN) IV   Vital Signs    Vitals:   12/14/22 0344 12/14/22 0846 12/14/22 0849 12/14/22 0852  BP: (!) 140/60 (!) 141/72 (!) 182/88 (!) 149/83  Pulse: 73 91 78 91  Resp: '20 18 19 19  '$ Temp: 98 F (36.7 C) 97.9 F (36.6 C)    TempSrc:  Oral    SpO2: 98% 99% 95% 99%  Weight:      Height:        Intake/Output Summary (Last 24 hours) at 12/14/2022 1429 Last data filed at 12/13/2022 1853 Gross per 24 hour  Intake 2004.87 ml  Output --  Net 2004.87 ml      12/11/2022    9:19 AM 10/24/2022    8:38 AM 05/15/2022    5:53 PM  Last 3 Weights  Weight (lbs) 180 lb 182 lb 6 oz 164 lb  Weight (kg) 81.647 kg 82.725 kg 74.39 kg      Telemetry    Normal sinus rhythm- Personally Reviewed  ECG     - Personally Reviewed  Physical Exam   GEN: No acute distress.   Neck: No JVD Cardiac: RRR, no murmurs, rubs, or gallops.  Respiratory: Clear to auscultation bilaterally. GI: Soft,  nontender, non-distended  MS: No edema; No deformity. Neuro:  Nonfocal  Psych: Normal affect   Labs    High Sensitivity Troponin:   Recent Labs  Lab 12/11/22 1610 12/11/22 1751 12/11/22 2300  TROPONINIHS 22* 20* 19*     Chemistry Recent Labs  Lab 12/11/22 1610 12/12/22 0338 12/13/22 0427 12/14/22 0433  NA  --  133* 135 136  K  --  3.9 4.0 3.8  CL  --  100 105 106  CO2  --  '22 23 22  '$ GLUCOSE  --  99 100* 91  BUN  --  27* 27* 19  CREATININE  --  1.89* 1.05* 0.86  CALCIUM  --  7.6* 7.3* 7.3*  MG 2.1  --   --   --   PROT  --  6.2*  --   --   ALBUMIN  --  2.9*  --   --   AST  --  22  --   --   ALT  --  22  --   --   ALKPHOS  --  64  --   --   BILITOT  --  0.9  --   --  GFRNONAA  --  27* 54* >60  ANIONGAP  --  '11 7 8    '$ Lipids No results for input(s): "CHOL", "TRIG", "HDL", "LABVLDL", "LDLCALC", "CHOLHDL" in the last 168 hours.  Hematology Recent Labs  Lab 12/12/22 0338 12/13/22 0427 12/14/22 0433  WBC 9.7 6.7 6.4  RBC 3.94 3.35* 3.31*  HGB 12.1 10.2* 10.2*  HCT 37.5 31.4* 30.9*  MCV 95.2 93.7 93.4  MCH 30.7 30.4 30.8  MCHC 32.3 32.5 33.0  RDW 15.8* 15.4 15.3  PLT 241 207 221   Thyroid No results for input(s): "TSH", "FREET4" in the last 168 hours.  BNPNo results for input(s): "BNP", "PROBNP" in the last 168 hours.  DDimer  Recent Labs  Lab 12/11/22 1713  DDIMER 2.40*     Radiology    No results found.  Cardiac Studies   Echocardiogram December 12, 2022 Left ventricular ejection fraction, by estimation, is 55 to 60%. The  left ventricle has normal function. The left ventricle has no regional  wall motion abnormalities. There is mild left ventricular hypertrophy.  Left ventricular diastolic parameters  are consistent with Grade I diastolic dysfunction (impaired relaxation).   2. Right ventricular systolic function is normal. The right ventricular  size is normal.   3. The mitral valve is normal in structure. Trivial mitral valve  regurgitation.  No evidence of mitral stenosis.   4. The aortic valve is tricuspid. Aortic valve regurgitation is not  visualized. No aortic stenosis is present.    Patient Profile    82 y.o. female with history of HFrEF with subsequent normalization of LV systolic function by echo this admission, PAF, syncope felt to be orthostatic in etiology during admission in 04/2022, HTN, and HLD who is being seen today for the evaluation of syncope   Assessment & Plan    Presyncope/syncope Concern for orthostasis in the setting of prerenal state, atrial fibrillation Unable to exclude cardiac arrhythmia /atrial fibrillation as a contributor, details as below Blood pressures currently running in the 867 systolic range Denies orthostasis symptoms this morning, completed hydration  Paroxysmal atrial fibrillation Noted yesterday morning on telemetry, converting to normal sinus rhythm with amiodarone load On carvedilol, Eliquis Follow-up with Caryl Comes  Nonischemic cardiomyopathy Prior ejection fraction 35 to 40% in May 2023, cardiac catheterization with no significant coronary disease Possible tachycardia mediated Repeat catheterization this admission, ejection fraction has normalized Continue carvedilol 3.125 twice daily Since recent falls, concern for orthostasis, ACE/ARB     Total encounter time more than 50 minutes  Greater than 50% was spent in counseling and coordination of care with the patient For questions or updates, please contact [place] Please consult [email or web address] for contact info under        Signed, Ida Rogue, MD  12/14/2022, 2:29 PM

## 2022-12-14 NOTE — Discharge Summary (Signed)
Physician Discharge Summary  Patient: Caroline Zamora DOB: 1941/09/01   Code Status: Full Code Admit date: 12/11/2022 Discharge date: 12/14/2022 Disposition: Home, No home health services recommended PCP: Kirk Ruths, MD  Recommendations for Outpatient Follow-up:  Follow up with PCP within 1-2 weeks Regarding general hospital follow up and preventative care Recommend CBC, metabolic panel Follow up with cardiology  Regarding atrial fibrillation and taper of amiodarone  Loop recorder   Discharge Diagnoses:  Principal Problem:   Syncope Active Problems:   Recurrent syncope   CAP (community acquired pneumonia)   A-fib ([place])   HFrEF (heart failure with reduced ejection fraction) (HCC)   Elevated troponin   Prolonged QT interval   Type 2 diabetes mellitus ([place])   Fall   Hypokalemia   Atrial fibrillation, chronic (HCC)   Prolonged Q-T interval on ECG   Orthostasis   Anxiety attack   Paroxysmal atrial fibrillation with RVR [facility])  Brief Hospital Course Summary: Caroline Zamora is a 82 y.o. female with a PMH significant for atrial fibrillation, recurring syncope, hypertension, hyperlipidemia presenting with syncope, prolonged QT and community-acquired pneumonia, fall.   They presented from home to the ED on 12/11/2022 with syncope. Patient noted to have been admitted back in June for similar symptoms associated with syncope. Had a fairly extensive cardiac workup with plan for abdominal binder and compression stockings.    In the ED, it was found that they had afebrile, hemodynamically stable. Satting well on room air.  Significant findings included White count 12.2, hemoglobin 15.  Creatinine 1.33.  Troponin in the 20s.  COVID-negative.  D-dimer 2.4.  CT head stable.  CT angio negative for PE but does show right upper lobe opacity concerning for pneumonia.  Noted subacute burst type compression fracture at T12 with up to 45% height loss.Marland Kitchen  Positive chronic  compression deformities involving T2 and T 3.   They were initially treated with amiodarone, algesia, potassium, azithromycin, CTX for PNA.   Syncope- patient found to have significantly positive orthostatics with symptoms of lightheadedness when standing. Had no more episodes of syncope after admission. In setting of treating HTN and hypovolemia from PNA. Cardiology recommended implantable loop recorder to be placed outpatient. She had evidence of subacute prior vertebral compression fracture from her frequent falls and her pain was well managed throughout admission. Orthostatics repeated on day of discharge were still mildly positive but patient was asymptomatic. BP medications had been adjusted as listed below to allow for higher BP goal.   PNA- treated initially with azithromycin and ceftriaxone. Transitioned azithromycin to doxycycline which was continued at discharge for full course due to her chronic prolonged Qtc. She remained stable ORA throughout admission  AKI- resolved with IVF support  Afib RVR- HR increased to 130s and was rate controlled with amiodarone. Prescribed a taper and will need further evaluation by cardiology.   Patient was admitted to medicine service for further workup and management of syncope, PNA, AKI as outlined in detail below.  Discharge Condition: Good, improved Recommended discharge diet: Cardiac diet  Consultations: Cardiology   Procedures/Studies: Echo- EF 55-60%  Allergies as of 12/14/2022       Reactions   Desyrel [trazodone] Rash   Sulfa Antibiotics Rash        Medication List     TAKE these medications    acetaminophen 500 MG tablet Commonly known as: TYLENOL Take 500 mg by mouth every 6 (six) hours as needed for mild pain.   amiodarone  200 MG tablet Commonly known as: PACERONE Take 2 tablets (400 mg total) by mouth 2 (two) times daily for 7 days, THEN 1 tablet (200 mg total) 2 (two) times daily for 7 days, THEN 1 tablet (200 mg  total) daily for 14 days. Start taking on: December 14, 2022 What changed: See the new instructions.   apixaban 5 MG Tabs tablet Commonly known as: ELIQUIS Take 5 mg by mouth 2 (two) times daily.   carvedilol 3.125 MG tablet Commonly known as: COREG Take 1 tablet (3.125 mg total) by mouth 2 (two) times daily with a meal. What changed:  how much to take how to take this when to take this additional instructions   doxycycline 50 MG capsule Commonly known as: VIBRAMYCIN Take 1 capsule (50 mg total) by mouth 2 (two) times daily for 5 days.   estradiol 0.5 MG tablet Commonly known as: ESTRACE Take 0.5 mg by mouth at bedtime.   levothyroxine 50 MCG tablet Commonly known as: SYNTHROID Take 50 mcg by mouth daily before breakfast.   venlafaxine XR 150 MG 24 hr capsule Commonly known as: EFFEXOR-XR Take 150 mg by mouth daily.         Subjective   Pt reports feeling well today. She had no symptoms of palpitations, lightheadedness, or dizziness during her orthostatic vital signs being taken this morning.  Denies chest pain, shortness of breath. Able to ambulate in her room without difficulty.  Denies back pain.  All questions and concerns were addressed at time of discharge.  Objective  Blood pressure (!) 140/60, pulse 73, temperature 98 F (36.7 C), resp. rate 20, height '5\' 6"'$  (1.676 m), weight 81.6 kg, SpO2 98 %.   General: Pt is alert, awake, not in acute distress Cardiovascular: irregular rhythm, regular rate, S1/S2 +, no rubs, no gallops Respiratory: CTA bilaterally, no wheezing, no rhonchi Abdominal: Soft, NT, ND, bowel sounds + Extremities: no edema, no cyanosis  The results of significant diagnostics from this hospitalization (including imaging, microbiology, ancillary and laboratory) are listed below for reference.   Imaging studies: ECHOCARDIOGRAM COMPLETE  Result Date: 12/12/2022    ECHOCARDIOGRAM REPORT   Patient Name:   Caroline Zamora Date of Exam:  12/12/2022 Medical Rec #:  [id number]         Height:       66.0 in Accession #:    [id number]        Weight:       180.0 lb Date of Birth:  [birth year]        BSA:          1.912 m Patient Age:    69 years          BP:           141/67 mmHg Patient Gender: F                 HR:           79 bpm. Exam Location:  [facility] Procedure: 2D Echo, Cardiac Doppler and Color Doppler Indications:     Syncope  History:         Patient has prior history of Echocardiogram examinations, most                  recent 04/10/2022. CHF, Arrythmias:Atrial Fibrillation,                  Signs/Symptoms:Syncope; Risk Factors:Diabetes and Non-Smoker.  Sonographer:     Wenda Low Referring Phys:  [place] Phys: Nelva Bush MD IMPRESSIONS  1. Left ventricular ejection fraction, by estimation, is 55 to 60%. The left ventricle has normal function. The left ventricle has no regional wall motion abnormalities. There is mild left ventricular hypertrophy. Left ventricular diastolic parameters are consistent with Grade I diastolic dysfunction (impaired relaxation).  2. Right ventricular systolic function is normal. The right ventricular size is normal.  3. The mitral valve is normal in structure. Trivial mitral valve regurgitation. No evidence of mitral stenosis.  4. The aortic valve is tricuspid. Aortic valve regurgitation is not visualized. No aortic stenosis is present. FINDINGS  Left Ventricle: Left ventricular ejection fraction, by estimation, is 55 to 60%. The left ventricle has normal function. The left ventricle has no regional wall motion abnormalities. The left ventricular internal cavity size was normal in size. There is  mild left ventricular hypertrophy. Left ventricular diastolic parameters are consistent with Grade I diastolic dysfunction (impaired relaxation). Right Ventricle: The right ventricular size is normal. No increase in right ventricular wall thickness. Right ventricular systolic function is normal.  Left Atrium: Left atrial size was normal in size. Right Atrium: Right atrial size was normal in size. Pericardium: There is no evidence of pericardial effusion. Mitral Valve: The mitral valve is normal in structure. Trivial mitral valve regurgitation. No evidence of mitral valve stenosis. MV peak gradient, 4.0 mmHg. The mean mitral valve gradient is 1.0 mmHg. Tricuspid Valve: The tricuspid valve is not well visualized. Tricuspid valve regurgitation is trivial. Aortic Valve: The aortic valve is tricuspid. Aortic valve regurgitation is not visualized. No aortic stenosis is present. Aortic valve mean gradient measures 5.0 mmHg. Aortic valve peak gradient measures 10.6 mmHg. Aortic valve area, by VTI measures 2.50  cm. Pulmonic Valve: The pulmonic valve was not well visualized. Pulmonic valve regurgitation is mild. No evidence of pulmonic stenosis. Aorta: The aortic root is normal in size and structure. Pulmonary Artery: The pulmonary artery is of normal size. Venous: The inferior vena cava was not well visualized. IAS/Shunts: The interatrial septum was not well visualized.  LEFT VENTRICLE PLAX 2D LVIDd:         4.60 cm   Diastology LVIDs:         2.60 cm   LV e' medial:    7.40 cm/s LV PW:         1.10 cm   LV E/e' medial:  5.3 LV IVS:        1.00 cm   LV e' lateral:   7.72 cm/s LVOT diam:     2.00 cm   LV E/e' lateral: 5.1 LV SV:         79 LV SV Index:   41 LVOT Area:     3.14 cm  RIGHT VENTRICLE RV Basal diam:  3.35 cm RV Mid diam:    2.90 cm RV S prime:     23.70 cm/s TAPSE (M-mode): 2.2 cm LEFT ATRIUM             Index        RIGHT ATRIUM           Index LA diam:        4.10 cm 2.14 cm/m   RA Area:     13.20 cm LA Vol (A2C):   61.0 ml 31.90 ml/m  RA Volume:   30.70 ml  16.05 ml/m LA Vol (A4C):   57.2 ml 29.91 ml/m LA Biplane Vol: 59.3 ml 31.01 ml/m  AORTIC VALVE  PULMONIC VALVE AV Area (Vmax):    2.35 cm      PV Vmax:       1.19 m/s AV Area (Vmean):   2.41 cm      PV Peak grad:  5.7  mmHg AV Area (VTI):     2.50 cm AV Vmax:           163.00 cm/s AV Vmean:          103.000 cm/s AV VTI:            0.314 m AV Peak Grad:      10.6 mmHg AV Mean Grad:      5.0 mmHg LVOT Vmax:         122.00 cm/s LVOT Vmean:        79.100 cm/s LVOT VTI:          0.250 m LVOT/AV VTI ratio: 0.80  AORTA Ao Root diam: 3.40 cm MITRAL VALVE MV Area (PHT): 2.77 cm    SHUNTS MV Area VTI:   3.69 cm    Systemic VTI:  0.25 m MV Peak grad:  4.0 mmHg    Systemic Diam: 2.00 cm MV Mean grad:  1.0 mmHg MV Vmax:       1.00 m/s MV Vmean:      49.7 cm/s MV Decel Time: 274 msec MV E velocity: 39.30 cm/s MV A velocity: 69.10 cm/s MV E/A ratio:  0.57 Harrell Gave End MD Electronically signed by Nelva Bush MD Signature Date/Time: 12/12/2022/10:40:23 AM    Final    CT Lumbar Spine Wo Contrast  Result Date: 12/11/2022 CLINICAL DATA:  Follow-up examination for spinal fracture. EXAM: CT LUMBAR SPINE WITHOUT CONTRAST TECHNIQUE: Multidetector CT imaging of the lumbar spine was performed without intravenous contrast administration. Multiplanar CT image reconstructions were also generated. RADIATION DOSE REDUCTION: This exam was performed according to the departmental dose-optimization program which includes automated exposure control, adjustment of the mA and/or kV according to patient size and/or use of iterative reconstruction technique. COMPARISON:  Prior CT from earlier the same day. FINDINGS: Segmentation: Standard. Lowest well-formed disc space labeled the L5-S1 level. Alignment: Mild levoscoliosis. Alignment otherwise normal with preservation of the normal lumbar lordosis. No significant listhesis. Vertebrae: Acute burst type compression fracture of T12 noted, described on corresponding thoracic spine CT. Otherwise, vertebral body height maintained. Visualized sacrum and pelvis intact. No discrete or worrisome osseous lesions. Subacute healing fractures of the right posterior tenth and eleventh ribs noted. Paraspinal and other soft  tissues: Paraspinous edema adjacent to the T12 compression fracture. Hepatic steatosis noted. Aortic atherosclerosis. Few simple right renal cyst noted, benign in appearance, no follow-up imaging recommended. Disc levels: L1-2: Reactive endplate spurring with minimal annular disc bulge. Mild facet hypertrophy. No significant canal or foraminal stenosis. L2-3: Degenerative intervertebral disc space narrowing with diffuse disc bulge and reactive endplate spurring, asymmetric to the right. Right worse than left facet hypertrophy. Resultant mild canal with bilateral subarticular stenosis. Moderate right with mild left L2 foraminal narrowing. L3-4: Disc bulge with endplate spurring. Moderate facet hypertrophy. Resultant mild-to-moderate spinal stenosis. Mild bilateral L3 foraminal narrowing. L4-5: Degenerative intervertebral disc space narrowing with diffuse disc bulge and reactive endplate spurring, asymmetric to the right. Moderate to advanced right worse than left facet arthrosis. Resultant mild canal with bilateral subarticular stenosis. Moderate bilateral L4 foraminal narrowing. L5-S1: Diffuse disc bulge. Moderate facet hypertrophy. No spinal stenosis. Mild left L5 foraminal narrowing. IMPRESSION: 1. Acute burst type compression fracture of T12, described on corresponding thoracic spine  CT. 2. No other acute traumatic injury within the lumbar spine. 3. Subacute healing fractures of the right posterior tenth and eleventh ribs. 4. Moderate multilevel degenerative spondylosis and facet arthrosis as above, resulting in mild-to-moderate spinal stenosis at L2-3 through L4-5. 5. Hepatic steatosis. Aortic Atherosclerosis (ICD10-I70.0). Electronically Signed   By: Jeannine Boga M.D.   On: 12/11/2022 20:42   CT Thoracic Spine Wo Contrast  Result Date: 12/11/2022 CLINICAL DATA:  Initial evaluation for acute spinal fracture. EXAM: CT THORACIC SPINE WITHOUT CONTRAST TECHNIQUE: Multidetector CT images of the thoracic  were obtained using the standard protocol without intravenous contrast. RADIATION DOSE REDUCTION: This exam was performed according to the departmental dose-optimization program which includes automated exposure control, adjustment of the mA and/or kV according to patient size and/or use of iterative reconstruction technique. COMPARISON:  Prior CT from earlier the same day. FINDINGS: Alignment: Straightening of the normal thoracic kyphosis. No significant listhesis. Vertebrae: Acute burst type compression fracture involving the T12 vertebral body is seen. Associated height loss measures up to 45% with 6 mm bony retropulsion. No visible extension through the posterior elements. Secondary flattening of the ventral thecal sac with mild to moderate spinal stenosis at this level. Additional subacute fractures of the right posterior ninth, tenth, and eleventh ribs noted. No other acute or recent fracture. Mild chronic compression deformity involving the superior endplates of T2 and T3 without bony retropulsion noted. Vertebral body height otherwise maintained. No worrisome osseous lesions. Paraspinal and other soft tissues: Mild paraspinous edema adjacent to the T12 fracture. Hepatic steatosis noted. Simple right renal cyst partially visualize, benign in appearance, no follow-up imaging recommended. Aortic atherosclerosis. Disc levels: T2-3: Right-sided facet arthrosis with resultant severe right foraminal stenosis. No spinal stenosis. T3-4: Central disc protrusion indents the ventral thecal sac without significant spinal stenosis. Foramina remain patent. T4-5: Small left paracentral disc protrusion without significant stenosis. T5-6: Small left paracentral disc protrusion without significant stenosis. T11 12: 6 mm bony retropulsion related to the T12 compression fracture. Resultant mild-to-moderate spinal stenosis. Foramina remain patent. Otherwise, no other significant disc pathology seen by CT. IMPRESSION: 1. Acute  burst type compression fracture involving the T12 vertebral body with up to 45% height loss and 6 mm bony retropulsion. 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 2. Additional subacute fractures of the right posterior ninth, tenth, and eleventh ribs. 3. Mild chronic compression deformities involving the superior endplates of T2 and T3 without bony retropulsion. 4. 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 5. Hepatic steatosis. Aortic Atherosclerosis (ICD10-I70.0). Electronically Signed   By: Jeannine Boga M.D.   On: 12/11/2022 20:35   CT Angio Chest Pulmonary Embolism (PE) W or WO Contrast  Result Date: 12/11/2022 CLINICAL DATA:  Weak dizzy EXAM: CT ANGIOGRAPHY CHEST WITH CONTRAST TECHNIQUE: Multidetector CT imaging of the chest was performed using the standard protocol during bolus administration of intravenous contrast. Multiplanar CT image reconstructions and MIPs were obtained to evaluate the vascular anatomy. RADIATION DOSE REDUCTION: This exam was performed according to the departmental dose-optimization program which includes automated exposure control, adjustment of the mA and/or kV according to patient size and/or use of iterative reconstruction technique. CONTRAST:  72m OMNIPAQUE IOHEXOL 350 MG/ML SOLN COMPARISON:  None Available. FINDINGS: Cardiovascular: Satisfactory opacification of the pulmonary arteries to the segmental level. No evidence of pulmonary embolism. Normal heart size. No pericardial effusion. Nonaneurysmal aorta. No dissection is seen. Mediastinum/Nodes: Midline trachea. No thyroid mass. No suspicious lymph nodes. Small moderate hiatal hernia. Lungs/Pleura: Heterogeneous ground-glass density in the  right upper lobe. No pleural effusion or pneumothorax Upper Abdomen: No acute abnormality. Musculoskeletal: Partially visualized acute to subacute burst type compression fracture of T12 with about 7 mm retropulsion of posterior vertebral body fracture fragment into the  spinal canal. There is mass effect and flattening of the anterior thecal sac. Review of the MIP images confirms the above findings. IMPRESSION: 1. Negative for acute pulmonary embolus or aortic dissection. 2. Heterogeneous ground-glass density in the right upper lobe, suspicious for pneumonia. 3. Partially visualized acute to subacute burst type compression fracture of T12 with about 7 mm retropulsion of posterior vertebral body fracture fragment into the spinal canal. There is mass effect on and flattening of the anterior thecal sac. Critical Value/emergent results were called by telephone at the time of interpretation on 12/11/2022 at 6:36 pm to provider [facility] , who verbally acknowledged these results. Aortic Atherosclerosis (ICD10-I70.0). Electronically Signed   By: Donavan Foil M.D.   On: 12/11/2022 18:36   DG Thoracic Spine 2 View  Result Date: 12/11/2022 CLINICAL DATA:  Fall. EXAM: THORACIC SPINE 2 VIEWS COMPARISON:  None Available. FINDINGS: Moderate compression deformity of T12 vertebral body is noted most consistent with old fracture. No definite acute fracture or spondylolisthesis is noted. Minimal degenerative changes are seen involving the lower thoracic spine. IMPRESSION: Moderate T12 compression deformity is noted most consistent with old fracture. However, if patient is symptomatic in this area, MRI may be performed to evaluate for possible acute fracture. No other definite abnormality is seen. Electronically Signed   By: Marijo Conception M.D.   On: 12/11/2022 17:07   CT Cervical Spine Wo Contrast  Result Date: 12/11/2022 CLINICAL DATA:  Trauma, recent fall EXAM: CT CERVICAL SPINE WITHOUT CONTRAST TECHNIQUE: Multidetector CT imaging of the cervical spine was performed without intravenous contrast. Multiplanar CT image reconstructions were also generated. RADIATION DOSE REDUCTION: This exam was performed according to the departmental dose-optimization program which includes automated  exposure control, adjustment of the mA and/or kV according to patient size and/or use of iterative reconstruction technique. COMPARISON:  05/15/2022 FINDINGS: Alignment: Alignment of posterior margins of vertebral bodies has not changed. There is minimal 1-2 mm anterolisthesis at C3-C4 and C7-T1 levels with no significant change. Skull base and vertebrae: No recent fracture is seen. Degenerative changes are noted. Soft tissues and spinal canal: There is extrinsic pressure over the ventral margin of thecal sac caused by posterior bony spurs from C4-C7 levels. Disc levels: There is encroachment of neural foramina from C3 to T1 levels. Upper chest: Unremarkable. Other: None. IMPRESSION: No recent fracture is seen in cervical spine. 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 No significant interval changes are noted. Electronically Signed   By: Elmer Picker M.D.   On: 12/11/2022 10:43   CT HEAD WO CONTRAST (5MM)  Result Date: 12/11/2022 CLINICAL DATA:  Trauma EXAM: CT HEAD WITHOUT CONTRAST TECHNIQUE: Contiguous axial images were obtained from the base of the skull through the vertex without intravenous contrast. RADIATION DOSE REDUCTION: This exam was performed according to the departmental dose-optimization program which includes automated exposure control, adjustment of the mA and/or kV according to patient size and/or use of iterative reconstruction technique. COMPARISON:  CT brain done on 05/15/2022 FINDINGS: Brain: No acute intracranial findings are seen. There are no signs of bleeding within the cranium. Cortical sulci are prominent. There is decreased density in periventricular white matter. There is no focal edema or mass effect. Vascular: Unremarkable. Skull: No fracture is seen in  calvarium. Sinuses/Orbits: No acute findings are seen. Other: None. IMPRESSION: No acute intracranial findings are seen in noncontrast CT brain. Atrophy. Small-vessel disease.  Electronically Signed   By: Elmer Picker M.D.   On: 12/11/2022 10:38   DG Hip Unilat W or Wo Pelvis 2-3 Views Left  Result Date: 12/11/2022 CLINICAL DATA:  Fall a week ago. Hematoma to the left hip and complains of left hip pain. Fall again today. EXAM: DG HIP (WITH OR WITHOUT PELVIS) 2-3V LEFT COMPARISON:  None Available. FINDINGS: There is no evidence of hip fracture or dislocation. Superolateral hip joint space narrowing with acetabular lip osteophytes. Marked soft tissue swelling of the left gluteal region. IMPRESSION: 1. No acute fracture or dislocation. 2. Marked soft tissue swelling of the left gluteal region. Electronically Signed   By: Keane Police D.O.   On: 12/11/2022 10:22    Labs: Basic Metabolic Panel: Recent Labs  Lab 12/11/22 0923 12/11/22 1610 12/12/22 0338 12/13/22 0427 12/14/22 0433  NA 133*  --  133* 135 136  K 3.1*  --  3.9 4.0 3.8  CL 97*  --  100 105 106  CO2 22  --  '22 23 22  '$ GLUCOSE 188*  --  99 100* 91  BUN 17  --  27* 27* 19  CREATININE 1.33*  --  1.89* 1.05* 0.86  CALCIUM 8.2*  --  7.6* 7.3* 7.3*  MG  --  2.1  --   --   --    CBC: Recent Labs  Lab 12/11/22 0923 12/12/22 0338 12/13/22 0427 12/14/22 0433  WBC 12.2* 9.7 6.7 6.4  NEUTROABS  --  5.9  --   --   HGB 14.7 12.1 10.2* 10.2*  HCT 45.0 37.5 31.4* 30.9*  MCV 93.6 95.2 93.7 93.4  PLT 339 241 207 221   Microbiology: Results for orders placed or performed during the hospital encounter of 12/11/22  Resp panel by RT-PCR (RSV, Flu A&B, Covid) Anterior Nasal Swab     Status: None   Collection Time: 12/11/22  4:15 PM   Specimen: Anterior Nasal Swab  Result Value Ref Range Status   SARS Coronavirus 2 by RT PCR NEGATIVE NEGATIVE Final    Comment: (NOTE) SARS-CoV-2 target nucleic acids are NOT DETECTED.  The SARS-CoV-2 RNA is generally detectable in upper respiratory specimens during the acute phase of infection. The lowest concentration of SARS-CoV-2 viral copies this assay can detect  is 138 copies/mL. A negative result does not preclude SARS-Cov-2 infection and should not be used as the sole basis for treatment or other patient management decisions. A negative result may occur with  improper specimen collection/handling, submission of specimen other than nasopharyngeal swab, presence of viral mutation(s) within the areas targeted by this assay, and inadequate number of viral copies(<138 copies/mL). A negative result must be combined with clinical observations, patient history, and epidemiological information. The expected result is Negative.  Fact Sheet for Patients:  EntrepreneurPulse.com.au  Fact Sheet for Healthcare Providers:  IncredibleEmployment.be  This test is no t yet approved or cleared by the [place] FDA and  has been authorized for detection and/or diagnosis of SARS-CoV-2 by FDA under an Emergency Use Authorization (EUA). This EUA will remain  in effect (meaning this test can be used) for the duration of the COVID-19 declaration under Section 564(b)(1) of the Act, 21 U.S.C.section 360bbb-3(b)(1), unless the authorization is terminated  or revoked sooner.       Influenza A by PCR NEGATIVE NEGATIVE Final  Influenza B by PCR NEGATIVE NEGATIVE Final    Comment: (NOTE) The Xpert Xpress SARS-CoV-2/FLU/RSV plus assay is intended as an aid in the diagnosis of influenza from Nasopharyngeal swab specimens and should not be used as a sole basis for treatment. Nasal washings and aspirates are unacceptable for Xpert Xpress SARS-CoV-2/FLU/RSV testing.  Fact Sheet for Patients: EntrepreneurPulse.com.au  Fact Sheet for Healthcare Providers: IncredibleEmployment.be  This test is not yet approved or cleared by the [place] FDA and has been authorized for detection and/or diagnosis of SARS-CoV-2 by FDA under an Emergency Use Authorization (EUA). This EUA will remain in effect  (meaning this test can be used) for the duration of the COVID-19 declaration under Section 564(b)(1) of the Act, 21 U.S.C. section 360bbb-3(b)(1), unless the authorization is terminated or revoked.     Resp Syncytial Virus by PCR NEGATIVE NEGATIVE Final    Comment: (NOTE) Fact Sheet for Patients: EntrepreneurPulse.com.au  Fact Sheet for Healthcare Providers: IncredibleEmployment.be  This test is not yet approved or cleared by the [place] FDA and has been authorized for detection and/or diagnosis of SARS-CoV-2 by FDA under an Emergency Use Authorization (EUA). This EUA will remain in effect (meaning this test can be used) for the duration of the COVID-19 declaration under Section 564(b)(1) of the Act, 21 U.S.C. section 360bbb-3(b)(1), unless the authorization is terminated or revoked.  Performed at [facility], [address] [place], [place] [address]    Time coordinating discharge: Over 30 minutes  Richarda Osmond, MD  [facility] 12/14/2022, 11:25 AM

## 2022-12-14 NOTE — Care Management Important Message (Signed)
Important Message  Patient Details  Name: Caroline Zamora MRN: [id number] Date of Birth: [birth year]   Medicare Important Message Given:  N/A - LOS <3 / Initial given by admissions     Dannette Barbara 12/14/2022, 12:58 PM

## 2022-12-14 NOTE — Plan of Care (Signed)
IV removed, discharge instructions reviewed, patient will schedule appointment with cardiologist within 2 weeks, son will pick up

## 2022-12-18 ENCOUNTER — Telehealth: Payer: Self-pay | Admitting: Internal Medicine

## 2022-12-18 NOTE — Telephone Encounter (Signed)
Per Caroline Lemmings, RN, Caroline Zamora needs to be seen sooner than March. Attempted to call Caroline Zamora and offer 1/25 at 11:20, but unable to leave a vm. Will offer Caroline Zamora 1/25 at 11:20 with Dr. Virl Axe. Caroline Zamora also will need to follow up in 1-2 months with general cardiology

## 2022-12-22 ENCOUNTER — Telehealth: Payer: Self-pay | Admitting: Internal Medicine

## 2022-12-22 NOTE — Telephone Encounter (Signed)
Pt c/o Shortness Of Breath: STAT if SOB developed within the last 24 hours or pt is noticeably SOB on the phone  1. Are you currently SOB (can you hear that pt is SOB on the phone)? Yes   2. How long have you been experiencing SOB? A week   3. Are you SOB when sitting or when up moving around? Moving around  4. Are you currently experiencing any other symptoms? Pre-syncope (felt like passing out)

## 2022-12-22 NOTE — Telephone Encounter (Signed)
Received call transferred from operator and spoke with patient. She reports she was recently in the hospital for shortness of breath.  Was treated for pneumonia.  Loop recorder was discussed.  Patient reports shortness of breath had improved upon discharge.  She was to follow up with PCP and cardiology.  She has appointment in March but is calling to schedule sooner follow up.  Per phone note scheduling tried to reach patient to schedule appointment with Dr Caryl Comes in [place] on 1/25 at 11:20.  Patient is able to come in for a visit at this time. I cannot tell if appointment is still available so will ask [place] office to contact her. Patient reports shortness of breath with exertion.  Improves with rest.  She is currently able to do activities needed around the house but it is becoming more difficult. She does not feel like she needs to go to ED at this time but doesn't feel as good as she would like to feel. No swelling.  Does not weigh daily. Has had a few episodes where she feels like she may pass out.  Last episode was a couple days ago.   Heart rate mostly in the 70's but sometimes will go up.  Patient aware if symptoms worsen she should go to ED for evaluation

## 2022-12-22 NOTE — Telephone Encounter (Signed)
I spoke with the patient and advised her we can see her on 12/28/22 at 11:20 am in the [place] office with Dr. Caryl Comes. Gabriel Cirri had attempted to reach out to the patient earlier this week to offer the slot per a message we had received from our hospital team post discharge.   See 1/15/254 phone note.  I have advised the patient I will cancel her March appointment with Dr. Caryl Comes. She is aware that the message we received post hospital was that she will need to establish with general cardiology in 1-2 months. I have advised the patient we can discuss this further when she is seen in the office next week with Dr Caryl Comes.  The patient voices understanding and is agreeable

## 2022-12-22 NOTE — Telephone Encounter (Signed)
The patient has been scheduled to see Dr. Caryl Comes on 12/28/22 at 11:20 am.

## 2022-12-28 ENCOUNTER — Emergency Department: Payer: Medicare HMO

## 2022-12-28 ENCOUNTER — Ambulatory Visit: Payer: Medicare HMO | Admitting: Internal Medicine

## 2022-12-28 ENCOUNTER — Emergency Department
Admission: EM | Admit: 2022-12-28 | Discharge: 2022-12-28 | Disposition: A | Discharge status: Home / Self Care | Payer: Medicare HMO | Attending: Emergency Medicine | Admitting: Emergency Medicine

## 2022-12-28 DIAGNOSIS — W19XXXA Unspecified fall, initial encounter: Secondary | ICD-10-CM | POA: Insufficient documentation

## 2022-12-28 DIAGNOSIS — I4891 Unspecified atrial fibrillation: Secondary | ICD-10-CM | POA: Diagnosis not present

## 2022-12-28 DIAGNOSIS — Z7901 Long term (current) use of anticoagulants: Secondary | ICD-10-CM | POA: Diagnosis not present

## 2022-12-28 DIAGNOSIS — E876 Hypokalemia: Secondary | ICD-10-CM | POA: Diagnosis not present

## 2022-12-28 DIAGNOSIS — R55 Syncope and collapse: Secondary | ICD-10-CM | POA: Insufficient documentation

## 2022-12-28 LAB — URINALYSIS, ROUTINE W REFLEX MICROSCOPIC
Bilirubin Urine: NEGATIVE
Glucose, UA: NEGATIVE mg/dL
Hgb urine dipstick: NEGATIVE
Ketones, ur: NEGATIVE mg/dL
Leukocytes,Ua: NEGATIVE
Nitrite: NEGATIVE
Protein, ur: 30 mg/dL — AB
Specific Gravity, Urine: 1.014 (ref 1.005–1.030)
pH: 6 (ref 5.0–8.0)

## 2022-12-28 LAB — CBC
HCT: 43.6 % (ref 36.0–46.0)
Hemoglobin: 14.3 g/dL (ref 12.0–15.0)
MCH: 30.5 pg (ref 26.0–34.0)
MCHC: 32.8 g/dL (ref 30.0–36.0)
MCV: 93 fL (ref 80.0–100.0)
Platelets: 423 10*3/uL — ABNORMAL HIGH (ref 150–400)
RBC: 4.69 MIL/uL (ref 3.87–5.11)
RDW: 14.7 % (ref 11.5–15.5)
WBC: 7.7 10*3/uL (ref 4.0–10.5)
nRBC: 0 % (ref 0.0–0.2)

## 2022-12-28 LAB — BASIC METABOLIC PANEL
Anion gap: 12 (ref 5–15)
BUN: 21 mg/dL (ref 8–23)
CO2: 23 mmol/L (ref 22–32)
Calcium: 8.3 mg/dL — ABNORMAL LOW (ref 8.9–10.3)
Chloride: 98 mmol/L (ref 98–111)
Creatinine, Ser: 1.22 mg/dL — ABNORMAL HIGH (ref 0.44–1.00)
GFR, Estimated: 45 mL/min — ABNORMAL LOW (ref >60)
Glucose, Bld: 124 mg/dL — ABNORMAL HIGH (ref 70–99)
Potassium: 3.3 mmol/L — ABNORMAL LOW (ref 3.5–5.1)
Sodium: 133 mmol/L — ABNORMAL LOW (ref 135–145)

## 2022-12-28 LAB — TROPONIN I (HIGH SENSITIVITY): Troponin I (High Sensitivity): 6 ng/L (ref <18)

## 2022-12-28 LAB — MAGNESIUM: Magnesium: 2.1 mg/dL (ref 1.7–2.4)

## 2022-12-28 MED ORDER — POTASSIUM CHLORIDE 10 MEQ/100ML IV SOLN
10.0000 meq | Freq: Once | INTRAVENOUS | Status: AC
  Administered 2022-12-28: 10 meq via INTRAVENOUS
  Filled 2022-12-28: qty 100

## 2022-12-28 MED ORDER — SODIUM CHLORIDE 0.9 % IV BOLUS
1000.0000 mL | Freq: Once | INTRAVENOUS | Status: AC
  Administered 2022-12-28: 1000 mL via INTRAVENOUS

## 2022-12-28 MED ORDER — POTASSIUM CHLORIDE CRYS ER 20 MEQ PO TBCR
40.0000 meq | EXTENDED_RELEASE_TABLET | Freq: Once | ORAL | Status: AC
  Administered 2022-12-28: 40 meq via ORAL
  Filled 2022-12-28: qty 2

## 2022-12-28 NOTE — ED Provider Notes (Signed)
[facility] Provider Note    Event Date/Time   First MD Initiated Contact with Patient 12/28/22 1427     (approximate)   History   Fall   HPI  Caroline Zamora is a 82 y.o. female with a past medical history significant for recurrent syncopal episodes, known prolonged QTc, atrial fibrillation on amiodarone, Eliquis, who presents to the emergency department following a syncopal episode.  Patient had a recent hospitalization for pneumonia, frequent syncopal episodes and atrial fibrillation with rapid rate.  Patient was having a follow-up appointment today with her cardiologist.  States that she got out of the car too quickly because she knew she was late to her appointment.  Attempted to quickly walk inside when she had a syncopal episode that was witnessed by her son.  No head injury or loss of consciousness.  States that she feels lightheaded when she goes to stand.  This has been a recurrent episode.  Denies any new medications.     Physical Exam   Triage Vital Signs: ED Triage Vitals  Enc Vitals Group     BP 12/28/22 1212 99/66     Pulse Rate 12/28/22 1212 84     Resp 12/28/22 1212 19     Temp 12/28/22 1212 97.8 F (36.6 C)     Temp Source 12/28/22 1212 Oral     SpO2 12/28/22 1212 98 %     Weight 12/28/22 1213 180 lb (81.6 kg)     Height --      Head Circumference --      Peak Flow --      Pain Score 12/28/22 1213 8     Pain Loc --      Pain Edu? --      Excl. in [place]? --     Most recent vital signs: Vitals:   12/28/22 1757 12/28/22 1758  BP: (!) 140/89 (!) 140/72  Pulse: 79 84  Resp: 17 17  Temp:    SpO2: 96% 99%    Physical Exam Constitutional:      Appearance: She is well-developed.  HENT:     Head:     Comments: Ecchymosis to the right eye and forehead which the son states is old Eyes:     Conjunctiva/sclera: Conjunctivae normal.  Cardiovascular:     Rate and Rhythm: Regular rhythm.  Pulmonary:     Effort: No respiratory  distress.  Abdominal:     General: There is no distension.     Tenderness: There is no abdominal tenderness.  Musculoskeletal:        General: Normal range of motion.     Cervical back: Normal range of motion. No tenderness.  Skin:    General: Skin is warm.  Neurological:     Mental Status: She is alert. Mental status is at baseline.     IMPRESSION / MDM / ASSESSMENT AND PLAN / ED COURSE  I reviewed the triage vital signs and the nursing notes.  Differential diagnosis including dysrhythmia, orthostatic hypotension, electrolyte abnormality, dehydration  On chart review patient has had a prolonged history of syncopal episodes that was thought to be orthostatic in nature.  She is followed by Dr. Caryl Comes with cardiology.  Patient has had a Zio patch.  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  Patient is on carvedilol.   EKG  I, Nathaniel Man, the attending physician, personally viewed and interpreted this ECG.   Rate: Normal  Rhythm: Normal sinus  Axis: Normal  Intervals: Prolonged QTc  ST&T Change: Nonspecific  .  RADIOLOGY I independently reviewed imaging, my interpretation of imaging: No intracranial hemorrhage, no obvious fracture or dislocation.  Read as no acute findings  LABS (all labs ordered are listed, but only abnormal results are displayed) Labs interpreted as -   Mildly low potassium at 3.3.  Creatinine mildly elevated but does not meet criteria for AKI. Labs Reviewed  BASIC METABOLIC PANEL - Abnormal; Notable for the following components:      Result Value   Sodium 133 (*)    Potassium 3.3 (*)    Glucose, Bld 124 (*)    Creatinine, Ser 1.22 (*)    Calcium 8.3 (*)    GFR, Estimated 45 (*)    All other components within normal limits  CBC - Abnormal; Notable for the following components:   Platelets 423 (*)    All other components within normal limits  URINALYSIS, ROUTINE W REFLEX MICROSCOPIC - Abnormal; Notable for the following components:   Color,  Urine YELLOW (*)    APPearance HAZY (*)    Protein, ur 30 (*)    Bacteria, UA MANY (*)    All other components within normal limits  MAGNESIUM  TROPONIN I (HIGH SENSITIVITY)  TROPONIN I (HIGH SENSITIVITY)    TREATMENT   1 L of IV fluids, p.o. potassium, IV potassium  Consulted and discussed the patient's case with Dr. Rockey Situ is on-call for cardiology who reviewed her records.  Felt that it was most likely secondary to orthostatic hypotension.  Recommended IV fluid bolus and IV potassium with follow-up as an outpatient closely with Dr. Caryl Comes.  Do not recommend changing any of her home medications at this time.  Orthostatic blood pressures are negative.  Patient was asymptomatic with ambulation in the emergency department.  Patient will be discharged home with close follow-up with cardiology.   PROCEDURES:  Critical Care performed: No  Procedures  Patient's presentation is most consistent with acute presentation with potential threat to life or bodily function.   MEDICATIONS ORDERED IN ED: Medications  potassium chloride SA (KLOR-CON M) CR tablet 40 mEq (40 mEq Oral Given 12/28/22 1547)  sodium chloride 0.9 % bolus 1,000 mL (0 mLs Intravenous Stopped 12/28/22 1749)  potassium chloride 10 mEq in 100 mL IVPB (0 mEq Intravenous Stopped 12/28/22 1745)    FINAL CLINICAL IMPRESSION(S) / ED DIAGNOSES   Final diagnoses:  Fall, initial encounter  Syncope, unspecified syncope type  Hypokalemia     Rx / DC Orders   ED Discharge Orders     None        Note:  This document was prepared using Dragon voice recognition software and may include unintentional dictation errors.   Nathaniel Man, MD 12/28/22 [phone]

## 2022-12-28 NOTE — ED Triage Notes (Addendum)
Pt arrived via POV. Per Pt, she has been falling a lot. Pt sts that she is not able to move her muscles. [facility] and saw Dr. Caryl Comes over there and he advised her to come to the ED.

## 2022-12-28 NOTE — ED Notes (Signed)
See triage note, pt reports multiple falls recently from "legs giving out". Denies LOC. +blood thinners. Bruising noted to right forehead and right eye. Reports lower back pain after todays fall.

## 2022-12-28 NOTE — ED Notes (Signed)
Pt provided urine cup

## 2022-12-28 NOTE — ED Notes (Signed)
Pt assisted to restroom in w/c. Denies dizziness.

## 2022-12-28 NOTE — Discharge Instructions (Signed)
It is important that when you go to stand up that you stand for at least 1 minute before you walk to prevent any falls or head trauma.  Follow-up closely with your cardiologist at 8 AM on Monday.  Return to the emergency department for any worsening symptoms.  It is importantly stay hydrated and drink plenty of fluids.  Your potassium was mildly low when checked today.  Make sure to follow up with a primary doctor to follow up your labs.  Make sure to eat food high in potassium and magnesium - examples - potatoes, spinach, bananas, beans, avocadoes, oranges, nuts.

## 2023-01-01 ENCOUNTER — Telehealth: Payer: Self-pay | Admitting: Internal Medicine

## 2023-01-01 ENCOUNTER — Ambulatory Visit: Payer: Medicare HMO | Admitting: Internal Medicine

## 2023-01-01 NOTE — Telephone Encounter (Signed)
Pt is requesting call back to discuss the appt she missed today due to lack of transportation and she would like to see if she can have a telephone appt. Requesting call back.

## 2023-01-01 NOTE — Telephone Encounter (Signed)
Dr. Caryl Comes- with multiple falls/ syncope are you ok if she has a virtual visit? Thoughts??

## 2023-01-05 ENCOUNTER — Other Ambulatory Visit: Payer: Self-pay | Admitting: Internal Medicine

## 2023-01-05 DIAGNOSIS — S22000A Wedge compression fracture of unspecified thoracic vertebra, initial encounter for closed fracture: Secondary | ICD-10-CM

## 2023-01-05 NOTE — Telephone Encounter (Signed)
Reviewed the patient's chart.  She is currently re/s to follow up with Dr. Caryl Comes on 03/15/23.  To Dr. Caryl Comes to confirm is he would prefer she have an in person visit at that time.

## 2023-01-08 ENCOUNTER — Telehealth: Payer: Self-pay | Admitting: Internal Medicine

## 2023-01-08 NOTE — Telephone Encounter (Signed)
  Patient Consent for Virtual Visit        Caroline Zamora has provided verbal consent on 01/08/2023 for a virtual visit (video or telephone).   CONSENT FOR VIRTUAL VISIT FOR:  Caroline Zamora  By participating in this virtual visit I agree to the following:  I hereby voluntarily request, consent and authorize [facility] and its employed or contracted physicians, physician assistants, nurse practitioners or other licensed health care professionals (the Practitioner), to provide me with telemedicine health care services (the "Services") as deemed necessary by the treating Practitioner. I acknowledge and consent to receive the Services by the Practitioner via telemedicine. I understand that the telemedicine visit will involve communicating with the Practitioner through live audiovisual communication technology and the disclosure of certain medical information by electronic transmission. I acknowledge that I have been given the opportunity to request an in-person assessment or other available alternative prior to the telemedicine visit and am voluntarily participating in the telemedicine visit.  I understand that I have the right to withhold or withdraw my consent to the use of telemedicine in the course of my care at any time, without affecting my right to future care or treatment, and that the Practitioner or I may terminate the telemedicine visit at any time. I understand that I have the right to inspect all information obtained and/or recorded in the course of the telemedicine visit and may receive copies of available information for a reasonable fee.  I understand that some of the potential risks of receiving the Services via telemedicine include:  Delay or interruption in medical evaluation due to technological equipment failure or disruption; Information transmitted may not be sufficient (e.g. poor resolution of images) to allow for appropriate medical decision making by the  Practitioner; and/or  In rare instances, security protocols could fail, causing a breach of personal health information.  Furthermore, I acknowledge that it is my responsibility to provide information about my medical history, conditions and care that is complete and accurate to the best of my ability. I acknowledge that Practitioner's advice, recommendations, and/or decision may be based on factors not within their control, such as incomplete or inaccurate data provided by me or distortions of diagnostic images or specimens that may result from electronic transmissions. I understand that the practice of medicine is not an exact science and that Practitioner makes no warranties or guarantees regarding treatment outcomes. I acknowledge that a copy of this consent can be made available to me via my patient portal ([facility]), or I can request a printed copy by calling the office of [facility].    I understand that my insurance will be billed for this visit.   I have read or had this consent read to me. I understand the contents of this consent, which adequately explains the benefits and risks of the Services being provided via telemedicine.  I have been provided ample opportunity to ask questions regarding this consent and the Services and have had my questions answered to my satisfaction. I give my informed consent for the services to be provided through the use of telemedicine in my medical care

## 2023-01-08 NOTE — Telephone Encounter (Signed)
I spoke with the patient regarding a follow up visit with Dr. Caryl Comes. She is currently scheduled for April 2024. With her recent discharge for A-fib on 1/11 & recurrent falls, I have advised her we can do a video visit with her on 01/11/23 if she would like.  Per the patient, she would absolutely appreciate a virtual visit on 2/8. She does not have a blood pressure cuff at home, but is able to check her HR. She is unable to weigh herself.   She is aware I will keep her April appointment scheduled for an in person visit with Dr. Caryl Comes, but she will be contacted about 10-15 minutes prior to her visit on 2/8 to go over her HR/ medications.  The patient voices understanding and is agreeable. She was very appreciative of the call today.   Telehealth consent updated.

## 2023-01-11 ENCOUNTER — Encounter: Payer: Self-pay | Admitting: Internal Medicine

## 2023-01-11 ENCOUNTER — Ambulatory Visit: Payer: Medicare HMO | Attending: Internal Medicine | Admitting: Internal Medicine

## 2023-01-11 VITALS — HR 65 | Ht 66.0 in | Wt 180.0 lb

## 2023-01-11 DIAGNOSIS — I493 Ventricular premature depolarization: Secondary | ICD-10-CM

## 2023-01-11 DIAGNOSIS — I951 Orthostatic hypotension: Secondary | ICD-10-CM

## 2023-01-11 DIAGNOSIS — I480 Paroxysmal atrial fibrillation: Secondary | ICD-10-CM | POA: Diagnosis not present

## 2023-01-11 DIAGNOSIS — R55 Syncope and collapse: Secondary | ICD-10-CM

## 2023-01-11 NOTE — Progress Notes (Signed)
Electrophysiology TeleHealth Note   Due to national recommendations of social distancing due to COVID 19, an audio/video telehealth visit is felt to be most appropriate for this patient at this time.  See MyChart message from today for the patient's consent to telehealth for [facility].   Date:  01/11/2023   ID:  Caroline Zamora, DOB January 18, 1941, MRN [id number]  Location: patient's home  Provider location: [address], [place]  Evaluation Performed: Initial Evaluation  PCP:  Kirk Ruths, MD  Cardiologist:  None  Electrophysiologist:  None   Chief Complaint:  orthostatic sycnope   History of Present Illness:    Caroline Zamora is a 82 y.o. female who presents via audio/video conferencing for a telehealth visit today for followup for orthostatic syncope and complex ventricular ectopy and modest nonischemic cardiomyopathy; interval atrial fibrillation started on amio and Apixaban  with interval improvement initially.  Interval falls  with syncope at the time of her last visit 1/24; treated in ER with IV fluids and K supplementation  Recurrent syncope over the weekend; has been in bed much of the time since then.  Started on fludrocortisone 3 days              DATE TEST EF    8/20 Echo   55-60 %    1/23 Echo   35 % LAE with that  1/23 MYOVIEW 59% Normal perfusion  5/23 cMRI 42%    1/24 Echo  55-60%     Date Cr K Hgb LDL TSH LFTs  9/21     14.5        3/23 0.9 4.2   130      7/23     13.4<< 9.7 (?)    12 12.  9/23         4.8    1/24 1.22 3.3 14.3            Past Medical History:  Diagnosis Date   Arthritis    lumbar spine   CKD (chronic kidney disease), stage II    Colon cancer (HCC)    Depression    Dysphagia    Headache    migraine   Hx of adenomatous colonic polyps    Hyperlipidemia    Hypertension    PAF (paroxysmal atrial fibrillation) (HCC)    Paroxysmal atrial fibrillation ([employer] [place])    Skin cancer    Syncope    Type 2  diabetes mellitus ([place]) 12/11/2022    Past Surgical History:  Procedure Laterality Date   ABDOMINAL HYSTERECTOMY     with removal of tubes and ovaries   ANKLE FRACTURE SURGERY Right    COLONOSCOPY     COLONOSCOPY WITH PROPOFOL N/A 09/03/2017   Procedure: COLONOSCOPY WITH PROPOFOL;  Surgeon: Manya Silvas, MD;  Location: [facility] ENDOSCOPY;  Service: Endoscopy;  Laterality: N/A;   COLONOSCOPY WITH PROPOFOL N/A 09/13/2020   Procedure: COLONOSCOPY WITH PROPOFOL;  Surgeon: Lesly Rubenstein, MD;  Location: [facility] ENDOSCOPY;  Service: Endoscopy;  Laterality: N/A;   DILATION AND CURETTAGE OF UTERUS     ESOPHAGOGASTRODUODENOSCOPY     ESOPHAGOGASTRODUODENOSCOPY (EGD) WITH PROPOFOL N/A 09/13/2020   Procedure: ESOPHAGOGASTRODUODENOSCOPY (EGD) WITH PROPOFOL;  Surgeon: Lesly Rubenstein, MD;  Location: [facility] ENDOSCOPY;  Service: Endoscopy;  Laterality: N/A;   LEFT HEART CATH AND CORONARY ANGIOGRAPHY N/A 04/17/2022   Procedure: LEFT HEART CATH AND CORONARY ANGIOGRAPHY;  Surgeon: Lorretta Harp, MD;  Location: [facility];  Service: Cardiovascular;  Laterality: N/A;   skin cancer removal     TONSILLECTOMY     TUBAL LIGATION      Current Outpatient Medications  Medication Sig Dispense Refill   acetaminophen (TYLENOL) 500 MG tablet Take 500 mg by mouth every 6 (six) hours as needed for mild pain.     apixaban (ELIQUIS) 5 MG TABS tablet Take 5 mg by mouth 2 (two) times daily.     estradiol (ESTRACE) 0.5 MG tablet Take 0.5 mg by mouth at bedtime.     levothyroxine (SYNTHROID) 50 MCG tablet Take 50 mcg by mouth daily before breakfast.     amiodarone (PACERONE) 200 MG tablet Take 2 tablets (400 mg total) by mouth 2 (two) times daily for 7 days, THEN 1 tablet (200 mg total) 2 (two) times daily for 7 days, THEN 1 tablet (200 mg total) daily for 14 days. 56 tablet 0   venlafaxine XR (EFFEXOR-XR) 150 MG 24 hr capsule Take 150 mg by mouth daily.     No current facility-administered medications for  this visit.    Allergies:   Desyrel [trazodone] and Sulfa antibiotics      ROS:  Please see the history of present illness.   All other systems are personally reviewed and negative.    Exam:    Vital Signs:  Pulse 65   Ht '5\' 6"'$  (1.676 m)   Wt 180 lb (81.6 kg)   LMP  (LMP Unknown)   BMI 29.05 kg/m     Well appearing, alert and conversant, regular work of breathing,  good skin color Eyes- anicteric, neuro- grossly intact, skin- no apparent rash or lesions or cyanosis, mouth- oral mucosa is pink Lying in bed    Labs/Other Tests and Data Reviewed:    Recent Labs: 04/15/2022: B Natriuretic Peptide 251.6 04/16/2022: TSH 7.366 12/12/2022: ALT 22 12/28/2022: BUN 21; Creatinine, Ser 1.22; Hemoglobin 14.3; Magnesium 2.1; Platelets 423; Potassium 3.3; Sodium 133   Wt Readings from Last 3 Encounters:  01/11/23 180 lb (81.6 kg)  12/28/22 180 lb (81.6 kg)  12/11/22 180 lb (81.6 kg)     Other studies personally reviewed: Additional studies/ records that were reviewed today include: (As above)   Review of the above records today demonstrates: (As above)         ASSESSMENT & PLAN:    Syncope abrupt in onset/offset   Orthostatic hypotension   Atrial fibrillation   PVCs-left bundle inferior axis   Dyspnea on exertion   Cardiomyopathy?   Incomplete right bundle branch block   Recurrent syncope, always standing.  She has not been in bed for a couple of days, We discussed the physiology of orthostatic intolerance including gravitational fluid shifts and the impact of hypertensive vascular disease on orthostasis and treatment options.  We discussed pharmacological options including midodrine, pyridostigmine, fludrocortisone.  We discussed nonpharmacological options including raising the HOB, isometric contraction upon standing, abdominal binders  thigh sleeves. We emphasized the importance of recognizing the prodrome and sitting prior to falling, safety in the shower and in the  bathroom and the avoidance of dehydration   We will begin with a combined nonpharmacological and pharmacological approach with stressing the importance of binders and getting them for the abdomen,  tighter ones for the thigh, elevating the head of the bed 6 inches, sitting up is much of the day as possible.  She is on fludrocortisone and she will need a metabolic profile in 2 weeks as she has has a history  of hypokalemia.  Will also start her on ProAmatine at 2.5 mg 3 times a day dosed at 7, 11, and 1500 hrs..  In literature review demonstrates orthostatic hypotension occurring in up to 20% of patients taking venlafaxine which may be mildly dose-dependent.  Will have her reach out to her prescribing physician to see if that can be down titrated or potentially eliminated  Follow-up:  2 weeks telehealth visit     Current medicines are reviewed at length with the patient today.   The patient  concerns regarding her medicines.  The following changes were made today: Add ProAmatine 2.5 mg a day as noted above  Labs/ tests ordered today include: Metabolic profile in 2 weeks No orders of the defined types were placed in this encounter.     Patient Risk:  after full review of this patients clinical status, I feel that they are at moderate risk at this time.  Today, I have spent 22 minutes with the patient with telehealth technology discussing   .    Signed, Virl Axe, MD  01/11/2023 8:06 AM     [facility] [address] [place] [address] [phone] (office) [phone] (fax)

## 2023-01-15 ENCOUNTER — Ambulatory Visit: Admission: RE | Admit: 2023-01-15 | Payer: Medicare HMO | Source: Ambulatory Visit

## 2023-01-16 ENCOUNTER — Inpatient Hospital Stay
Admission: EM | Admit: 2023-01-16 | Discharge: 2023-01-26 | DRG: 459 | Disposition: A | Discharge status: Home Health | Payer: Medicare HMO | Attending: Internal Medicine | Admitting: Internal Medicine

## 2023-01-16 ENCOUNTER — Other Ambulatory Visit: Payer: Self-pay

## 2023-01-16 ENCOUNTER — Emergency Department: Payer: Medicare HMO

## 2023-01-16 DIAGNOSIS — Z86010 Personal history of colonic polyps: Secondary | ICD-10-CM

## 2023-01-16 DIAGNOSIS — G9341 Metabolic encephalopathy: Secondary | ICD-10-CM | POA: Diagnosis not present

## 2023-01-16 DIAGNOSIS — Z7189 Other specified counseling: Secondary | ICD-10-CM | POA: Diagnosis not present

## 2023-01-16 DIAGNOSIS — S22082A Unstable burst fracture of T11-T12 vertebra, initial encounter for closed fracture: Secondary | ICD-10-CM | POA: Diagnosis present

## 2023-01-16 DIAGNOSIS — Z85038 Personal history of other malignant neoplasm of large intestine: Secondary | ICD-10-CM

## 2023-01-16 DIAGNOSIS — Z79890 Hormone replacement therapy (postmenopausal): Secondary | ICD-10-CM

## 2023-01-16 DIAGNOSIS — K760 Fatty (change of) liver, not elsewhere classified: Secondary | ICD-10-CM | POA: Diagnosis present

## 2023-01-16 DIAGNOSIS — W19XXXA Unspecified fall, initial encounter: Secondary | ICD-10-CM | POA: Diagnosis not present

## 2023-01-16 DIAGNOSIS — E1122 Type 2 diabetes mellitus with diabetic chronic kidney disease: Secondary | ICD-10-CM | POA: Diagnosis present

## 2023-01-16 DIAGNOSIS — R410 Disorientation, unspecified: Secondary | ICD-10-CM | POA: Diagnosis not present

## 2023-01-16 DIAGNOSIS — N1831 Chronic kidney disease, stage 3a: Secondary | ICD-10-CM | POA: Diagnosis present

## 2023-01-16 DIAGNOSIS — E039 Hypothyroidism, unspecified: Secondary | ICD-10-CM | POA: Diagnosis present

## 2023-01-16 DIAGNOSIS — I10 Essential (primary) hypertension: Secondary | ICD-10-CM | POA: Diagnosis not present

## 2023-01-16 DIAGNOSIS — F418 Other specified anxiety disorders: Secondary | ICD-10-CM | POA: Diagnosis present

## 2023-01-16 DIAGNOSIS — Y92009 Unspecified place in unspecified non-institutional (private) residence as the place of occurrence of the external cause: Secondary | ICD-10-CM | POA: Diagnosis not present

## 2023-01-16 DIAGNOSIS — Z85828 Personal history of other malignant neoplasm of skin: Secondary | ICD-10-CM | POA: Diagnosis not present

## 2023-01-16 DIAGNOSIS — M4804 Spinal stenosis, thoracic region: Secondary | ICD-10-CM

## 2023-01-16 DIAGNOSIS — M4014 Other secondary kyphosis, thoracic region: Secondary | ICD-10-CM

## 2023-01-16 DIAGNOSIS — Z90710 Acquired absence of both cervix and uterus: Secondary | ICD-10-CM

## 2023-01-16 DIAGNOSIS — I700 Atherosclerosis of aorta: Secondary | ICD-10-CM | POA: Diagnosis present

## 2023-01-16 DIAGNOSIS — Z7901 Long term (current) use of anticoagulants: Secondary | ICD-10-CM

## 2023-01-16 DIAGNOSIS — I482 Chronic atrial fibrillation, unspecified: Secondary | ICD-10-CM | POA: Diagnosis present

## 2023-01-16 DIAGNOSIS — R443 Hallucinations, unspecified: Secondary | ICD-10-CM | POA: Diagnosis not present

## 2023-01-16 DIAGNOSIS — I130 Hypertensive heart and chronic kidney disease with heart failure and stage 1 through stage 4 chronic kidney disease, or unspecified chronic kidney disease: Secondary | ICD-10-CM | POA: Diagnosis present

## 2023-01-16 DIAGNOSIS — M40204 Unspecified kyphosis, thoracic region: Secondary | ICD-10-CM | POA: Diagnosis present

## 2023-01-16 DIAGNOSIS — Z79899 Other long term (current) drug therapy: Secondary | ICD-10-CM

## 2023-01-16 DIAGNOSIS — M549 Dorsalgia, unspecified: Secondary | ICD-10-CM | POA: Diagnosis not present

## 2023-01-16 DIAGNOSIS — S22080D Wedge compression fracture of T11-T12 vertebra, subsequent encounter for fracture with routine healing: Principal | ICD-10-CM

## 2023-01-16 DIAGNOSIS — R197 Diarrhea, unspecified: Secondary | ICD-10-CM | POA: Diagnosis present

## 2023-01-16 DIAGNOSIS — R296 Repeated falls: Secondary | ICD-10-CM | POA: Diagnosis not present

## 2023-01-16 DIAGNOSIS — E876 Hypokalemia: Secondary | ICD-10-CM | POA: Diagnosis present

## 2023-01-16 DIAGNOSIS — F32A Depression, unspecified: Secondary | ICD-10-CM | POA: Diagnosis present

## 2023-01-16 DIAGNOSIS — E785 Hyperlipidemia, unspecified: Secondary | ICD-10-CM | POA: Diagnosis present

## 2023-01-16 DIAGNOSIS — Z515 Encounter for palliative care: Secondary | ICD-10-CM | POA: Diagnosis not present

## 2023-01-16 DIAGNOSIS — Z882 Allergy status to sulfonamides status: Secondary | ICD-10-CM

## 2023-01-16 DIAGNOSIS — M532X4 Spinal instabilities, thoracic region: Secondary | ICD-10-CM

## 2023-01-16 DIAGNOSIS — F419 Anxiety disorder, unspecified: Secondary | ICD-10-CM | POA: Diagnosis present

## 2023-01-16 DIAGNOSIS — Z888 Allergy status to other drugs, medicaments and biological substances status: Secondary | ICD-10-CM

## 2023-01-16 DIAGNOSIS — R531 Weakness: Secondary | ICD-10-CM | POA: Diagnosis not present

## 2023-01-16 DIAGNOSIS — Z885 Allergy status to narcotic agent status: Secondary | ICD-10-CM

## 2023-01-16 DIAGNOSIS — I5032 Chronic diastolic (congestive) heart failure: Secondary | ICD-10-CM | POA: Diagnosis present

## 2023-01-16 DIAGNOSIS — I951 Orthostatic hypotension: Secondary | ICD-10-CM | POA: Diagnosis present

## 2023-01-16 DIAGNOSIS — F05 Delirium due to known physiological condition: Secondary | ICD-10-CM | POA: Diagnosis not present

## 2023-01-16 DIAGNOSIS — I480 Paroxysmal atrial fibrillation: Secondary | ICD-10-CM | POA: Diagnosis present

## 2023-01-16 DIAGNOSIS — Z833 Family history of diabetes mellitus: Secondary | ICD-10-CM

## 2023-01-16 DIAGNOSIS — S22089A Unspecified fracture of T11-T12 vertebra, initial encounter for closed fracture: Secondary | ICD-10-CM | POA: Diagnosis not present

## 2023-01-16 DIAGNOSIS — Z781 Physical restraint status: Secondary | ICD-10-CM

## 2023-01-16 DIAGNOSIS — Z5320 Procedure and treatment not carried out because of patient's decision for unspecified reasons: Secondary | ICD-10-CM | POA: Diagnosis present

## 2023-01-16 DIAGNOSIS — S22082S Unstable burst fracture of T11-T12 vertebra, sequela: Secondary | ICD-10-CM | POA: Diagnosis not present

## 2023-01-16 LAB — BASIC METABOLIC PANEL
Anion gap: 12 (ref 5–15)
BUN: 21 mg/dL (ref 8–23)
CO2: 24 mmol/L (ref 22–32)
Calcium: 8.1 mg/dL — ABNORMAL LOW (ref 8.9–10.3)
Chloride: 99 mmol/L (ref 98–111)
Creatinine, Ser: 1.01 mg/dL — ABNORMAL HIGH (ref 0.44–1.00)
GFR, Estimated: 57 mL/min — ABNORMAL LOW (ref >60)
Glucose, Bld: 113 mg/dL — ABNORMAL HIGH (ref 70–99)
Potassium: 2.6 mmol/L — CL (ref 3.5–5.1)
Sodium: 135 mmol/L (ref 135–145)

## 2023-01-16 LAB — CBC WITH DIFFERENTIAL/PLATELET
Abs Immature Granulocytes: 0.02 10*3/uL (ref 0.00–0.07)
Basophils Absolute: 0 10*3/uL (ref 0.0–0.1)
Basophils Relative: 1 %
Eosinophils Absolute: 0 10*3/uL (ref 0.0–0.5)
Eosinophils Relative: 1 %
HCT: 42.8 % (ref 36.0–46.0)
Hemoglobin: 14 g/dL (ref 12.0–15.0)
Immature Granulocytes: 0 %
Lymphocytes Relative: 35 %
Lymphs Abs: 1.9 10*3/uL (ref 0.7–4.0)
MCH: 29.6 pg (ref 26.0–34.0)
MCHC: 32.7 g/dL (ref 30.0–36.0)
MCV: 90.5 fL (ref 80.0–100.0)
Monocytes Absolute: 0.4 10*3/uL (ref 0.1–1.0)
Monocytes Relative: 8 %
Neutro Abs: 2.9 10*3/uL (ref 1.7–7.7)
Neutrophils Relative %: 55 %
Platelets: 363 10*3/uL (ref 150–400)
RBC: 4.73 MIL/uL (ref 3.87–5.11)
RDW: 13.3 % (ref 11.5–15.5)
WBC: 5.3 10*3/uL (ref 4.0–10.5)
nRBC: 0 % (ref 0.0–0.2)

## 2023-01-16 LAB — BRAIN NATRIURETIC PEPTIDE: B Natriuretic Peptide: 30.6 pg/mL (ref 0.0–100.0)

## 2023-01-16 LAB — MAGNESIUM: Magnesium: 2 mg/dL (ref 1.7–2.4)

## 2023-01-16 LAB — PROTIME-INR
INR: 1.3 — ABNORMAL HIGH (ref 0.8–1.2)
Prothrombin Time: 15.9 seconds — ABNORMAL HIGH (ref 11.4–15.2)

## 2023-01-16 LAB — PHOSPHORUS: Phosphorus: 1.7 mg/dL — ABNORMAL LOW (ref 2.5–4.6)

## 2023-01-16 MED ORDER — POTASSIUM CHLORIDE IN NACL 20-0.9 MEQ/L-% IV SOLN
INTRAVENOUS | Status: DC
  Filled 2023-01-16: qty 1000

## 2023-01-16 MED ORDER — MELATONIN 5 MG PO TABS
5.0000 mg | ORAL_TABLET | Freq: Every evening | ORAL | Status: DC | PRN
  Administered 2023-01-18: 5 mg via ORAL
  Filled 2023-01-16: qty 1

## 2023-01-16 MED ORDER — ACETAMINOPHEN 650 MG RE SUPP: 650.0000 mg | Freq: Four times a day (QID) | RECTAL | Status: DC | PRN

## 2023-01-16 MED ORDER — FENTANYL CITRATE PF 50 MCG/ML IJ SOSY
50.0000 ug | PREFILLED_SYRINGE | Freq: Once | INTRAMUSCULAR | Status: AC
  Administered 2023-01-16: 50 ug via INTRAVENOUS
  Filled 2023-01-16: qty 1

## 2023-01-16 MED ORDER — LEVOTHYROXINE SODIUM 50 MCG PO TABS
50.0000 ug | ORAL_TABLET | Freq: Every day | ORAL | Status: DC
  Administered 2023-01-17 – 2023-01-26 (×9): 50 ug via ORAL
  Filled 2023-01-16 (×10): qty 1

## 2023-01-16 MED ORDER — METOCLOPRAMIDE HCL 5 MG/ML IJ SOLN: 5.0000 mg | Freq: Three times a day (TID) | INTRAMUSCULAR | Status: DC | PRN

## 2023-01-16 MED ORDER — ENOXAPARIN SODIUM 40 MG/0.4ML IJ SOSY
40.0000 mg | PREFILLED_SYRINGE | INTRAMUSCULAR | Status: DC
  Filled 2023-01-16: qty 0.4

## 2023-01-16 MED ORDER — APIXABAN 5 MG PO TABS
5.0000 mg | ORAL_TABLET | Freq: Two times a day (BID) | ORAL | Status: DC
  Administered 2023-01-16 – 2023-01-17 (×3): 5 mg via ORAL
  Filled 2023-01-16 (×3): qty 1

## 2023-01-16 MED ORDER — DIPHENHYDRAMINE HCL 50 MG/ML IJ SOLN
12.5000 mg | Freq: Three times a day (TID) | INTRAMUSCULAR | Status: DC | PRN
  Filled 2023-01-16: qty 1

## 2023-01-16 MED ORDER — MORPHINE SULFATE (PF) 2 MG/ML IV SOLN: 1.0000 mg | INTRAVENOUS | Status: DC | PRN

## 2023-01-16 MED ORDER — AMIODARONE HCL 200 MG PO TABS
200.0000 mg | ORAL_TABLET | Freq: Every day | ORAL | Status: DC
  Administered 2023-01-16 – 2023-01-26 (×11): 200 mg via ORAL
  Filled 2023-01-16 (×12): qty 1

## 2023-01-16 MED ORDER — POTASSIUM CHLORIDE CRYS ER 20 MEQ PO TBCR
40.0000 meq | EXTENDED_RELEASE_TABLET | Freq: Once | ORAL | Status: AC
  Administered 2023-01-16: 40 meq via ORAL
  Filled 2023-01-16: qty 2

## 2023-01-16 MED ORDER — POTASSIUM CHLORIDE CRYS ER 20 MEQ PO TBCR
40.0000 meq | EXTENDED_RELEASE_TABLET | ORAL | Status: DC
  Filled 2023-01-16: qty 2

## 2023-01-16 MED ORDER — FLUDROCORTISONE ACETATE 0.1 MG PO TABS
0.1000 mg | ORAL_TABLET | Freq: Every day | ORAL | Status: DC
  Administered 2023-01-16 – 2023-01-18 (×3): 0.1 mg via ORAL
  Filled 2023-01-16 (×3): qty 1

## 2023-01-16 MED ORDER — POTASSIUM PHOSPHATES 15 MMOLE/5ML IV SOLN
15.0000 mmol | Freq: Once | INTRAVENOUS | Status: AC
  Administered 2023-01-16: 15 mmol via INTRAVENOUS
  Filled 2023-01-16 (×2): qty 5

## 2023-01-16 MED ORDER — METHOCARBAMOL 1000 MG/10ML IJ SOLN
500.0000 mg | Freq: Once | INTRAMUSCULAR | Status: AC
  Administered 2023-01-16: 500 mg via INTRAVENOUS
  Filled 2023-01-16: qty 5

## 2023-01-16 MED ORDER — KETOROLAC TROMETHAMINE 30 MG/ML IJ SOLN
15.0000 mg | Freq: Once | INTRAMUSCULAR | Status: AC
  Administered 2023-01-16: 15 mg via INTRAVENOUS
  Filled 2023-01-16: qty 1

## 2023-01-16 MED ORDER — MAGNESIUM HYDROXIDE 400 MG/5ML PO SUSP: 30.0000 mL | Freq: Every day | ORAL | Status: DC | PRN

## 2023-01-16 MED ORDER — ACETAMINOPHEN 325 MG PO TABS
650.0000 mg | ORAL_TABLET | Freq: Four times a day (QID) | ORAL | Status: DC | PRN
  Administered 2023-01-18: 650 mg via ORAL
  Filled 2023-01-16: qty 2

## 2023-01-16 MED ORDER — ESTRADIOL 0.5 MG PO TABS
0.5000 mg | ORAL_TABLET | Freq: Every day | ORAL | Status: DC
  Administered 2023-01-16 – 2023-01-25 (×9): 0.5 mg via ORAL
  Filled 2023-01-16 (×11): qty 1

## 2023-01-16 MED ORDER — OXYCODONE-ACETAMINOPHEN 5-325 MG PO TABS
1.0000 | ORAL_TABLET | ORAL | Status: DC | PRN
  Administered 2023-01-16 – 2023-01-17 (×4): 1 via ORAL
  Filled 2023-01-16 (×5): qty 1

## 2023-01-16 MED ORDER — METHOCARBAMOL 500 MG PO TABS
500.0000 mg | ORAL_TABLET | Freq: Three times a day (TID) | ORAL | Status: DC | PRN
  Administered 2023-01-16 – 2023-01-18 (×5): 500 mg via ORAL
  Filled 2023-01-16 (×5): qty 1

## 2023-01-16 MED ORDER — POTASSIUM CHLORIDE CRYS ER 20 MEQ PO TBCR
40.0000 meq | EXTENDED_RELEASE_TABLET | ORAL | Status: DC
  Administered 2023-01-16: 40 meq via ORAL
  Filled 2023-01-16: qty 2

## 2023-01-16 MED ORDER — LIDOCAINE 5 % EX PTCH
1.0000 | MEDICATED_PATCH | CUTANEOUS | Status: DC
  Administered 2023-01-17 – 2023-01-26 (×10): 1 via TRANSDERMAL
  Filled 2023-01-16 (×11): qty 1

## 2023-01-16 MED ORDER — POTASSIUM CHLORIDE 10 MEQ/100ML IV SOLN
10.0000 meq | INTRAVENOUS | Status: AC
  Administered 2023-01-16 (×2): 10 meq via INTRAVENOUS
  Filled 2023-01-16 (×2): qty 100

## 2023-01-16 MED ORDER — HYDRALAZINE HCL 20 MG/ML IJ SOLN
5.0000 mg | INTRAMUSCULAR | Status: DC | PRN
  Administered 2023-01-18 – 2023-01-19 (×3): 5 mg via INTRAVENOUS
  Filled 2023-01-16 (×3): qty 1

## 2023-01-16 MED ORDER — VENLAFAXINE HCL ER 150 MG PO CP24
150.0000 mg | ORAL_CAPSULE | Freq: Every day | ORAL | Status: DC
  Administered 2023-01-16 – 2023-01-26 (×11): 150 mg via ORAL
  Filled 2023-01-16 (×9): qty 1
  Filled 2023-01-16 (×2): qty 2

## 2023-01-16 NOTE — Progress Notes (Signed)
Orthopedic Tech Progress Note Patient Details:  Caroline Zamora 1941-12-02 [id number]  Patient ID: [name], female   DOB: 1941/03/12, 82 y.o.   MRN: [id number] Called order into [facility].  Karolee Stamps 01/16/2023, 6:51 AM

## 2023-01-16 NOTE — ED Triage Notes (Signed)
Pt to [place] from home via [facility] c/o recurrent LBP x27month. Pt has fallen multiple times and was supposed to have an mri yesterday, but d/t pain issues had to cancel/

## 2023-01-16 NOTE — ED Notes (Signed)
Called ortho brace tech [facility]) and ordered TLSO brace

## 2023-01-16 NOTE — Consult Note (Signed)
Neurosurgery-New Consultation Evaluation 01/16/2023 MAKENIZE FALETTI [id number]  Identifying Statement: Caroline Zamora is a 82 y.o. female from [place] [id number] with a history of A fib on Eliquis, CKD stage III, congestive heart failure, hypertension, hyperlipidemia, colon cancer, hypothyroidism, anxiety, depression, syncope.  Physician Requesting Consultation: No ref. provider found  History of Present Illness: Caroline Zamora is a 82 year old with the above medical history presenting for acute on subacute low back pain.  She states she has had back pain since before Christmas without any radicular symptoms at that time.  She was working out at [employer] and doing [employer] which provided her significant relief until a fall in January resulting in hospitalization.  She was found to have a T12 compression fracture with retropulsion and was treated conservatively.  She states she has recently had more syncopal type falls however she presented to the ER on 01/16/2023 with worsening mid back pain that was unbearable.  Today she reports sharp back pain worse with movement and pain that radiates into her anterior thighs bilaterally without extension below her knees.  She does endorse recent diarrhea but attributes this to food ingestion.  She denies any perineal numbness.  While she does endorse bouts of lower extremity weakness, she states that this occurs with her syncopal events and is not persistent.  She denies any bowel or bladder incontinence or persistent focal lower extremity weakness.  Past Medical History:  Past Medical History:  Diagnosis Date   Arthritis    lumbar spine   CKD (chronic kidney disease), stage II    Colon cancer (HCC)    Depression    Dysphagia    Headache    migraine   Hx of adenomatous colonic polyps    Hyperlipidemia    Hypertension    PAF (paroxysmal atrial fibrillation) (HCC)    Paroxysmal atrial fibrillation (HCC)    Skin cancer    Syncope     Type 2 diabetes mellitus ([place]) 12/11/2022    Social History: Social History   Socioeconomic History   Marital status: Widowed    Spouse name: Not on file   Number of children: Not on file   Years of education: Not on file   Highest education level: Not on file  Occupational History   Not on file  Tobacco Use   Smoking status: Never   Smokeless tobacco: Never  Vaping Use   Vaping Use: Never used  Substance and Sexual Activity   Alcohol use: Not Currently    Comment: a couple of glasses of wine per day   Drug use: No   Sexual activity: Not on file  Other Topics Concern   Not on file  Social History Narrative   Not on file   Social Determinants of Health   Financial Resource Strain: Not on file  Food Insecurity: No Food Insecurity (12/12/2022)   Hunger Vital Sign    Worried About Running Out of Food in the Last Year: Never true    Ran Out of Food in the Last Year: Never true  Transportation Needs: No Transportation Needs (12/12/2022)   PRAPARE - Hydrologist (Medical): No    Lack of Transportation (Non-Medical): No  Physical Activity: Not on file  Stress: Not on file  Social Connections: Not on file  Intimate Partner Violence: Not At Risk (12/12/2022)   Humiliation, Afraid, Rape, and Kick questionnaire    Fear of Current or Ex-Partner: No    Emotionally Abused:  No    Physically Abused: No    Sexually Abused: No    Family History: Family History  Problem Relation Age of Onset   Diabetes Mellitus II Mother    Atrial fibrillation Father    Breast cancer Neg Hx     Review of Systems:  Review of Systems - General ROS: Negative Psychological ROS: Negative Ophthalmic ROS: Negative ENT ROS: Negative Hematological and Lymphatic ROS: Negative  Endocrine ROS: Negative Respiratory ROS: Negative Cardiovascular ROS: Negative Gastrointestinal ROS: Negative Genito-Urinary ROS: Negative Musculoskeletal ROS: Negative Neurological ROS:  Negative Dermatological ROS: Negative  Physical Exam: BP (!) 182/79 (BP Location: Left Arm)   Pulse 72   Temp 98.2 F (36.8 C)   Resp 18   Ht 5' 6"$  (1.676 m)   Wt 81.6 kg   LMP  (LMP Unknown)   SpO2 99%   BMI 29.05 kg/m  Body mass index is 29.05 kg/m. Body surface area is 1.95 meters squared. General appearance: Alert, cooperative, in no acute distress Head: Normocephalic, atraumatic Eyes: Normal, EOM intact Oropharynx: Moist without lesions Neck: Supple, no tenderness Heart: Normal, regular rate and rhythm, without murmur Lungs: Clear to auscultation, good air exchange Abdomen: Soft, nondistended Ext: No edema in LE bilaterally, good distal pulses  Neurologic exam:  Mental status: alertness: alert, orientation: person, place, time, affect: normal Speech: fluent and clear Cranial nerves:  II: Visual fields are full by confrontation, no ptosis III/IV/VI: extra-ocular motions intact bilaterally V/VII:no evidence of facial droop or weakness and facial sensation intact VIII: hearing normal XI: trapezius strength symmetric,  sternocleidomastoid strength symmetric XII: tongue strength symmetric  Motor:strength symmetric 5/5, normal muscle mass and tone in all extremities and no pronator drift Sensory: intact to light touch in all extremities Reflexes: 2+ and symmetric bilaterally for arms and legs Coordination: intact finger to nose Gait: normal   Laboratory: Results for orders placed or performed during the hospital encounter of 01/16/23  CBC with Differential/Platelet  Result Value Ref Range   WBC 5.3 4.0 - 10.5 K/uL   RBC 4.73 3.87 - 5.11 MIL/uL   Hemoglobin 14.0 12.0 - 15.0 g/dL   HCT 42.8 36.0 - 46.0 %   MCV 90.5 80.0 - 100.0 fL   MCH 29.6 26.0 - 34.0 pg   MCHC 32.7 30.0 - 36.0 g/dL   RDW 13.3 11.5 - 15.5 %   Platelets 363 150 - 400 K/uL   nRBC 0.0 0.0 - 0.2 %   Neutrophils Relative % 55 %   Neutro Abs 2.9 1.7 - 7.7 K/uL   Lymphocytes Relative 35 %   Lymphs  Abs 1.9 0.7 - 4.0 K/uL   Monocytes Relative 8 %   Monocytes Absolute 0.4 0.1 - 1.0 K/uL   Eosinophils Relative 1 %   Eosinophils Absolute 0.0 0.0 - 0.5 K/uL   Basophils Relative 1 %   Basophils Absolute 0.0 0.0 - 0.1 K/uL   Immature Granulocytes 0 %   Abs Immature Granulocytes 0.02 0.00 - 0.07 K/uL  Basic metabolic panel  Result Value Ref Range   Sodium 135 135 - 145 mmol/L   Potassium 2.6 (LL) 3.5 - 5.1 mmol/L   Chloride 99 98 - 111 mmol/L   CO2 24 22 - 32 mmol/L   Glucose, Bld 113 (H) 70 - 99 mg/dL   BUN 21 8 - 23 mg/dL   Creatinine, Ser 1.01 (H) 0.44 - 1.00 mg/dL   Calcium 8.1 (L) 8.9 - 10.3 mg/dL   GFR, Estimated 57 (L) >60  mL/min   Anion gap 12 5 - 15  Magnesium  Result Value Ref Range   Magnesium 2.0 1.7 - 2.4 mg/dL  Phosphorus  Result Value Ref Range   Phosphorus 1.7 (L) 2.5 - 4.6 mg/dL  Brain natriuretic peptide  Result Value Ref Range   B Natriuretic Peptide 30.6 0.0 - 100.0 pg/mL  Protime-INR  Result Value Ref Range   Prothrombin Time 15.9 (H) 11.4 - 15.2 seconds   INR 1.3 (H) 0.8 - 1.2    Imaging:  I personally reviewed radiology studies to include:  MRI L spine 19-Jan-2023  IMPRESSION: 1. Subacute T12 body fracture with advanced height loss that is progressed from 12/11/2022 CT. Also progressed is retropulsion which causes lower cord compression. 2. Chronic/degenerative changes as described.     Electronically Signed   By: Jorje Guild M.D.   On: Jan 19, 2023 05:39    MRI T spine 01/19/2023  IMPRESSION: 1. Subacute T12 body fracture with advanced height loss that is progressed from 12/11/2022 CT. Also progressed is retropulsion which causes lower cord compression. 2. Chronic/degenerative changes as described.     Electronically Signed   By: Jorje Guild M.D.   On: January 19, 2023 05:39  CT T spine 12/11/22 IMPRESSION: 1. Acute burst type compression fracture involving the T12 vertebral body with up to 45% height loss and 6 mm bony  retropulsion. 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 2. Additional subacute fractures of the right posterior ninth, tenth, and eleventh ribs. 3. Mild chronic compression deformities involving the superior endplates of T2 and T3 without bony retropulsion. 4. 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 5. Hepatic steatosis.   Aortic Atherosclerosis (ICD10-I70.0).     Electronically Signed   By: Jeannine Boga M.D.   On: 12/11/2022 20:35   Impression/Plan:     1.  Diagnosis: T12 burst fracture with stenosis  2.  Plan - TLSO brace - pain management  - outpatient follow up with neurosurgery; red flag symptoms were reviewed with the patient  [facility] Neurosurgery

## 2023-01-16 NOTE — H&P (View-Only) (Signed)
Neurosurgery-New Consultation Evaluation 01/16/2023 MARGAREE TRINKA VN:[id number]  Identifying Statement: LADEJA FORTNA is a 82 y.o. female from [place] [id number] with a history of A fib on Eliquis, CKD stage III, congestive heart failure, hypertension, hyperlipidemia, colon cancer, hypothyroidism, anxiety, depression, syncope.  Physician Requesting Consultation: No ref. provider found  History of Present Illness: Ms. Kessiah Monteleone is a 82 year old with the above medical history presenting for acute on subacute low back pain.  She states she has had back pain since before Christmas without any radicular symptoms at that time.  She was working out at [employer] and doing [employer] which provided her significant relief until a fall in January resulting in hospitalization.  She was found to have a T12 compression fracture with retropulsion and was treated conservatively.  She states she has recently had more syncopal type falls however she presented to the ER on 01/16/2023 with worsening mid back pain that was unbearable.  Today she reports sharp back pain worse with movement and pain that radiates into her anterior thighs bilaterally without extension below her knees.  She does endorse recent diarrhea but attributes this to food ingestion.  She denies any perineal numbness.  While she does endorse bouts of lower extremity weakness, she states that this occurs with her syncopal events and is not persistent.  She denies any bowel or bladder incontinence or persistent focal lower extremity weakness.  Past Medical History:  Past Medical History:  Diagnosis Date   Arthritis    lumbar spine   CKD (chronic kidney disease), stage II    Colon cancer (HCC)    Depression    Dysphagia    Headache    migraine   Hx of adenomatous colonic polyps    Hyperlipidemia    Hypertension    PAF (paroxysmal atrial fibrillation) (HCC)    Paroxysmal atrial fibrillation (HCC)    Skin cancer    Syncope     Type 2 diabetes mellitus ([place]) 12/11/2022    Social History: Social History   Socioeconomic History   Marital status: Widowed    Spouse name: Not on file   Number of children: Not on file   Years of education: Not on file   Highest education level: Not on file  Occupational History   Not on file  Tobacco Use   Smoking status: Never   Smokeless tobacco: Never  Vaping Use   Vaping Use: Never used  Substance and Sexual Activity   Alcohol use: Not Currently    Comment: a couple of glasses of wine per day   Drug use: No   Sexual activity: Not on file  Other Topics Concern   Not on file  Social History Narrative   Not on file   Social Determinants of Health   Financial Resource Strain: Not on file  Food Insecurity: No Food Insecurity (12/12/2022)   Hunger Vital Sign    Worried About Running Out of Food in the Last Year: Never true    Ran Out of Food in the Last Year: Never true  Transportation Needs: No Transportation Needs (12/12/2022)   PRAPARE - Hydrologist (Medical): No    Lack of Transportation (Non-Medical): No  Physical Activity: Not on file  Stress: Not on file  Social Connections: Not on file  Intimate Partner Violence: Not At Risk (12/12/2022)   Humiliation, Afraid, Rape, and Kick questionnaire    Fear of Current or Ex-Partner: No    Emotionally Abused:  No    Physically Abused: No    Sexually Abused: No    Family History: Family History  Problem Relation Age of Onset   Diabetes Mellitus II Mother    Atrial fibrillation Father    Breast cancer Neg Hx     Review of Systems:  Review of Systems - General ROS: Negative Psychological ROS: Negative Ophthalmic ROS: Negative ENT ROS: Negative Hematological and Lymphatic ROS: Negative  Endocrine ROS: Negative Respiratory ROS: Negative Cardiovascular ROS: Negative Gastrointestinal ROS: Negative Genito-Urinary ROS: Negative Musculoskeletal ROS: Negative Neurological ROS:  Negative Dermatological ROS: Negative  Physical Exam: BP (!) 182/79 (BP Location: Left Arm)   Pulse 72   Temp 98.2 F (36.8 C)   Resp 18   Ht 5' 6"$  (1.676 m)   Wt 81.6 kg   LMP  (LMP Unknown)   SpO2 99%   BMI 29.05 kg/m  Body mass index is 29.05 kg/m. Body surface area is 1.95 meters squared. General appearance: Alert, cooperative, in no acute distress Head: Normocephalic, atraumatic Eyes: Normal, EOM intact Oropharynx: Moist without lesions Neck: Supple, no tenderness Heart: Normal, regular rate and rhythm, without murmur Lungs: Clear to auscultation, good air exchange Abdomen: Soft, nondistended Ext: No edema in LE bilaterally, good distal pulses  Neurologic exam:  Mental status: alertness: alert, orientation: person, place, time, affect: normal Speech: fluent and clear Cranial nerves:  II: Visual fields are full by confrontation, no ptosis III/IV/VI: extra-ocular motions intact bilaterally V/VII:no evidence of facial droop or weakness and facial sensation intact VIII: hearing normal XI: trapezius strength symmetric,  sternocleidomastoid strength symmetric XII: tongue strength symmetric  Motor:strength symmetric 5/5, normal muscle mass and tone in all extremities and no pronator drift Sensory: intact to light touch in all extremities Reflexes: 2+ and symmetric bilaterally for arms and legs Coordination: intact finger to nose Gait: normal   Laboratory: Results for orders placed or performed during the hospital encounter of 01/16/23  CBC with Differential/Platelet  Result Value Ref Range   WBC 5.3 4.0 - 10.5 K/uL   RBC 4.73 3.87 - 5.11 MIL/uL   Hemoglobin 14.0 12.0 - 15.0 g/dL   HCT 42.8 36.0 - 46.0 %   MCV 90.5 80.0 - 100.0 fL   MCH 29.6 26.0 - 34.0 pg   MCHC 32.7 30.0 - 36.0 g/dL   RDW 13.3 11.5 - 15.5 %   Platelets 363 150 - 400 K/uL   nRBC 0.0 0.0 - 0.2 %   Neutrophils Relative % 55 %   Neutro Abs 2.9 1.7 - 7.7 K/uL   Lymphocytes Relative 35 %   Lymphs  Abs 1.9 0.7 - 4.0 K/uL   Monocytes Relative 8 %   Monocytes Absolute 0.4 0.1 - 1.0 K/uL   Eosinophils Relative 1 %   Eosinophils Absolute 0.0 0.0 - 0.5 K/uL   Basophils Relative 1 %   Basophils Absolute 0.0 0.0 - 0.1 K/uL   Immature Granulocytes 0 %   Abs Immature Granulocytes 0.02 0.00 - 0.07 K/uL  Basic metabolic panel  Result Value Ref Range   Sodium 135 135 - 145 mmol/L   Potassium 2.6 (LL) 3.5 - 5.1 mmol/L   Chloride 99 98 - 111 mmol/L   CO2 24 22 - 32 mmol/L   Glucose, Bld 113 (H) 70 - 99 mg/dL   BUN 21 8 - 23 mg/dL   Creatinine, Ser 1.01 (H) 0.44 - 1.00 mg/dL   Calcium 8.1 (L) 8.9 - 10.3 mg/dL   GFR, Estimated 57 (L) >60  mL/min   Anion gap 12 5 - 15  Magnesium  Result Value Ref Range   Magnesium 2.0 1.7 - 2.4 mg/dL  Phosphorus  Result Value Ref Range   Phosphorus 1.7 (L) 2.5 - 4.6 mg/dL  Brain natriuretic peptide  Result Value Ref Range   B Natriuretic Peptide 30.6 0.0 - 100.0 pg/mL  Protime-INR  Result Value Ref Range   Prothrombin Time 15.9 (H) 11.4 - 15.2 seconds   INR 1.3 (H) 0.8 - 1.2    Imaging:  I personally reviewed radiology studies to include:  MRI L spine February 08, 2023  IMPRESSION: 1. Subacute T12 body fracture with advanced height loss that is progressed from 12/11/2022 CT. Also progressed is retropulsion which causes lower cord compression. 2. Chronic/degenerative changes as described.     Electronically Signed   By: Jorje Guild M.D.   On: Feb 08, 2023 05:39    MRI T spine 2023-02-08  IMPRESSION: 1. Subacute T12 body fracture with advanced height loss that is progressed from 12/11/2022 CT. Also progressed is retropulsion which causes lower cord compression. 2. Chronic/degenerative changes as described.     Electronically Signed   By: Jorje Guild M.D.   On: 08-Feb-2023 05:39  CT T spine 12/11/22 IMPRESSION: 1. Acute burst type compression fracture involving the T12 vertebral body with up to 45% height loss and 6 mm bony  retropulsion. 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 2. Additional subacute fractures of the right posterior ninth, tenth, and eleventh ribs. 3. Mild chronic compression deformities involving the superior endplates of T2 and T3 without bony retropulsion. 4. 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 5. Hepatic steatosis.   Aortic Atherosclerosis (ICD10-I70.0).     Electronically Signed   By: Jeannine Boga M.D.   On: 12/11/2022 20:35   Impression/Plan:     1.  Diagnosis: T12 burst fracture with stenosis  2.  Plan - TLSO brace - pain management  - outpatient follow up with neurosurgery; red flag symptoms were reviewed with the patient  Cooper Render [facility] Neurosurgery

## 2023-01-16 NOTE — ED Provider Notes (Addendum)
[facility] Provider Note    Event Date/Time   First MD Initiated Contact with Patient 01/16/23 [phone]     (approximate)   History   Back Pain   HPI  Caroline Zamora is a 82 y.o. female with history of paroxysmal A-fib on Eliquis, hypertension, hyperlipidemia, diabetes, chronic kidney disease with tree of orthostasis causing frequent falls who presents to the emergency department with worsening mid back pain.  Patient states that she fell at the beginning of January and was found to have a T12 compression fracture.  She states that she had been taking Tylenol at home for pain.  She has had several falls since then with no further imaging of her back.  She was post have an MRI of her thoracic spine yesterday ordered by her PCP but could not make it due to the pain.  She denies numbness, tingling, overflow incontinence, bowel incontinence, urinary retention.  She states some of the small she has hit her head but has had recent CT scan of her head that was negative and no falls since that time.  She is complaining of diffuse mid thoracic and upper lumbar back pain.  Unable to ambulate due to pain.   History provided by patient, son, EMS.    Past Medical History:  Diagnosis Date   Arthritis    lumbar spine   CKD (chronic kidney disease), stage II    Colon cancer ([facility])    Depression    Dysphagia    Headache    migraine   Hx of adenomatous colonic polyps    Hyperlipidemia    Hypertension    PAF (paroxysmal atrial fibrillation) (HCC)    Paroxysmal atrial fibrillation (HCC)    Skin cancer    Syncope    Type 2 diabetes mellitus ([place]) 12/11/2022    Past Surgical History:  Procedure Laterality Date   ABDOMINAL HYSTERECTOMY     with removal of tubes and ovaries   ANKLE FRACTURE SURGERY Right    COLONOSCOPY     COLONOSCOPY WITH PROPOFOL N/A 09/03/2017   Procedure: COLONOSCOPY WITH PROPOFOL;  Surgeon: Manya Silvas, MD;  Location: [facility] ENDOSCOPY;   Service: Endoscopy;  Laterality: N/A;   COLONOSCOPY WITH PROPOFOL N/A 09/13/2020   Procedure: COLONOSCOPY WITH PROPOFOL;  Surgeon: Lesly Rubenstein, MD;  Location: [facility] ENDOSCOPY;  Service: Endoscopy;  Laterality: N/A;   DILATION AND CURETTAGE OF UTERUS     ESOPHAGOGASTRODUODENOSCOPY     ESOPHAGOGASTRODUODENOSCOPY (EGD) WITH PROPOFOL N/A 09/13/2020   Procedure: ESOPHAGOGASTRODUODENOSCOPY (EGD) WITH PROPOFOL;  Surgeon: Lesly Rubenstein, MD;  Location: [facility] ENDOSCOPY;  Service: Endoscopy;  Laterality: N/A;   LEFT HEART CATH AND CORONARY ANGIOGRAPHY N/A 04/17/2022   Procedure: LEFT HEART CATH AND CORONARY ANGIOGRAPHY;  Surgeon: Lorretta Harp, MD;  Location: [facility];  Service: Cardiovascular;  Laterality: N/A;   skin cancer removal     TONSILLECTOMY     TUBAL LIGATION      MEDICATIONS:  Prior to Admission medications   Medication Sig Start Date End Date Taking? Authorizing Provider  acetaminophen (TYLENOL) 500 MG tablet Take 500 mg by mouth every 6 (six) hours as needed for mild pain.    [provider]  amiodarone (PACERONE) 200 MG tablet Take 200 mg by mouth daily.    [provider]  apixaban (ELIQUIS) 5 MG TABS tablet Take 5 mg by mouth 2 (two) times daily.    [provider]  estradiol (ESTRACE) 0.5 MG  tablet Take 0.5 mg by mouth at bedtime.    [provider]  levothyroxine (SYNTHROID) 50 MCG tablet Take 50 mcg by mouth daily before breakfast.    [provider]  venlafaxine XR (EFFEXOR-XR) 150 MG 24 hr capsule Take 150 mg by mouth daily. 01/20/22   [provider]    Physical Exam   Triage Vital Signs: ED Triage Vitals  Enc Vitals Group     BP 01/16/23 0441 (!) 119/104     Pulse Rate 01/16/23 0441 77     Resp 01/16/23 0441 20     Temp 01/16/23 0441 97.7 F (36.5 C)     Temp Source 01/16/23 0441 Oral     SpO2 01/16/23 0441 100 %     Weight 01/16/23 0443 180 lb (81.6 kg)     Height 01/16/23 0443 5' 6"$   (1.676 m)     Head Circumference --      Peak Flow --      Pain Score 01/16/23 0443 10     Pain Loc --      Pain Edu? --      Excl. in [place]? --     Most recent vital signs: Vitals:   01/16/23 0441  BP: (!) 119/104  Pulse: 77  Resp: 20  Temp: 97.7 F (36.5 C)  SpO2: 100%    CONSTITUTIONAL: Alert, responds appropriately to questions.  Elderly, appears uncomfortable HEAD: Normocephalic, atraumatic EYES: Conjunctivae clear, pupils appear equal, sclera nonicteric ENT: normal nose; moist mucous membranes NECK: Supple, normal ROM CARD: RRR; S1 and S2 appreciated RESP: Normal chest excursion without splinting or tachypnea; breath sounds clear and equal bilaterally; no wheezes, no rhonchi, no rales, no hypoxia or respiratory distress, speaking full sentences ABD/GI: Non-distended; soft, non-tender, no rebound, no guarding, no peritoneal signs BACK: The back appears normal, tender to palpation without step-off or deformity EXT: Normal ROM in all joints; no deformity noted, no edema SKIN: Normal color for age and race; warm; no rash on exposed skin NEURO: Moves all extremities equally, normal speech PSYCH: The patient's mood and manner are appropriate.   ED Results / Procedures / Treatments   LABS: (all labs ordered are listed, but only abnormal results are displayed) Labs Reviewed  BASIC METABOLIC PANEL - Abnormal; Notable for the following components:      Result Value   Potassium 2.6 (*)    Glucose, Bld 113 (*)    Creatinine, Ser 1.01 (*)    Calcium 8.1 (*)    GFR, Estimated 57 (*)    All other components within normal limits  CBC WITH DIFFERENTIAL/PLATELET  URINALYSIS, ROUTINE W REFLEX MICROSCOPIC  MAGNESIUM     EKG:   Date: 01/16/2023 6:02 AM  Rate: 66  Rhythm: normal sinus rhythm  QRS Axis: normal  Intervals: normal  ST/T Wave abnormalities: normal  Conduction Disutrbances: none  Narrative Interpretation: Right bundle branch block, QTc 529  ms      RADIOLOGY: My personal review and interpretation of imaging: MRI shows subacute T12 fracture with worsening loss of height and retropulsion causing cord compression.  I have personally reviewed all radiology reports.   MR THORACIC SPINE WO CONTRAST  Result Date: 01/16/2023 CLINICAL DATA:  Recurrent back pain for 6 months. EXAM: MRI THORACIC AND LUMBAR SPINE WITHOUT CONTRAST TECHNIQUE: Multiplanar and multiecho pulse sequences of the thoracic and lumbar spine were obtained without intravenous contrast. COMPARISON:  12/11/2022 thoracic spine CT FINDINGS: MRI THORACIC SPINE FINDINGS Alignment:  Mild spinal curvature.  No traumatic listhesis. Vertebrae: Subacute T12 fracture with advanced height loss that is progressed from before. Posterosuperior corner retropulsion compressing the lower cord. Cord: Lower cord compression as described above. No visible cord edema. Paraspinal and other soft tissues: Perivertebral edema and strain at the level of T12 fracture. Disc levels: Notable degenerative change: T3-4 small right paracentral herniation with ventral cord contact. T5-6 small central disc protrusion that is shallow. MRI LUMBAR SPINE FINDINGS Segmentation:  5 lumbar type vertebrae Alignment:  Unremarkable Vertebrae:  No fracture, evidence of discitis, or bone lesion. Conus medullaris and cauda equina: Conus extends to the L1 level. Conus and cauda equina appear normal. Paraspinal and other soft tissues: Negative for paravertebral mass or inflammation Disc levels: L1-L2: Ventral spondylitic spurring.  Mild disc bulging. L2-L3: Disc narrowing and bulging with bilateral inferior foraminal herniation. Patent canal and foramina L3-L4: Degenerative facet spurring with ligamentum flavum thickening. The disc is narrowed and circumferentially bulging. Noncompressive thecal sac narrowing L4-L5: Disc narrowing with endplate and facet spurring bilaterally. The canal and foramina are patent L5-S1:Disc bulging  eccentric to the left. Moderate degenerative facet spurring. IMPRESSION: 1. Subacute T12 body fracture with advanced height loss that is progressed from 12/11/2022 CT. Also progressed is retropulsion which causes lower cord compression. 2. Chronic/degenerative changes as described. Electronically Signed   By: Jorje Guild M.D.   On: 01/16/2023 05:39   MR LUMBAR SPINE WO CONTRAST  Result Date: 01/16/2023 CLINICAL DATA:  Recurrent back pain for 6 months. EXAM: MRI THORACIC AND LUMBAR SPINE WITHOUT CONTRAST TECHNIQUE: Multiplanar and multiecho pulse sequences of the thoracic and lumbar spine were obtained without intravenous contrast. COMPARISON:  12/11/2022 thoracic spine CT FINDINGS: MRI THORACIC SPINE FINDINGS Alignment:  Mild spinal curvature.  No traumatic listhesis. Vertebrae: Subacute T12 fracture with advanced height loss that is progressed from before. Posterosuperior corner retropulsion compressing the lower cord. Cord: Lower cord compression as described above. No visible cord edema. Paraspinal and other soft tissues: Perivertebral edema and strain at the level of T12 fracture. Disc levels: Notable degenerative change: T3-4 small right paracentral herniation with ventral cord contact. T5-6 small central disc protrusion that is shallow. MRI LUMBAR SPINE FINDINGS Segmentation:  5 lumbar type vertebrae Alignment:  Unremarkable Vertebrae:  No fracture, evidence of discitis, or bone lesion. Conus medullaris and cauda equina: Conus extends to the L1 level. Conus and cauda equina appear normal. Paraspinal and other soft tissues: Negative for paravertebral mass or inflammation Disc levels: L1-L2: Ventral spondylitic spurring.  Mild disc bulging. L2-L3: Disc narrowing and bulging with bilateral inferior foraminal herniation. Patent canal and foramina L3-L4: Degenerative facet spurring with ligamentum flavum thickening. The disc is narrowed and circumferentially bulging. Noncompressive thecal sac narrowing  L4-L5: Disc narrowing with endplate and facet spurring bilaterally. The canal and foramina are patent L5-S1:Disc bulging eccentric to the left. Moderate degenerative facet spurring. IMPRESSION: 1. Subacute T12 body fracture with advanced height loss that is progressed from 12/11/2022 CT. Also progressed is retropulsion which causes lower cord compression. 2. Chronic/degenerative changes as described. Electronically Signed   By: Jorje Guild M.D.   On: 01/16/2023 05:39     PROCEDURES:  Critical Care performed: No      .1-3 Lead EKG Interpretation  Performed by: Jancie Kercher, Delice Bison, DO Authorized by: Kahlil Cowans, Delice Bison, DO     Interpretation: normal     ECG rate:  77   ECG rate assessment: normal     Rhythm: sinus rhythm     Ectopy: none  Conduction: normal       IMPRESSION / MDM / ASSESSMENT AND PLAN / ED COURSE  I reviewed the triage vital signs and the nursing notes.    Patient here with history of frequent falls with increasing back pain.  The patient is on the cardiac monitor to evaluate for evidence of arrhythmia and/or significant heart rate changes.   DIFFERENTIAL DIAGNOSIS (includes but not limited to):   Worsening compression fracture, spinal cord stenosis, radiculopathy, new fracture, epidural hematoma   Patient's presentation is most consistent with acute complicated illness / injury requiring diagnostic workup.   PLAN: Will obtain CBC, BMP, urinalysis, MRI of the T and L-spine without contrast.  Will give pain medication.   MEDICATIONS GIVEN IN ED: Medications  potassium chloride 10 mEq in 100 mL IVPB (10 mEq Intravenous New Bag/Given 01/16/23 0550)  enoxaparin (LOVENOX) injection 40 mg (has no administration in time range)  0.9 % NaCl with KCl 20 mEq/ L  infusion (has no administration in time range)  acetaminophen (TYLENOL) tablet 650 mg (has no administration in time range)    Or  acetaminophen (TYLENOL) suppository 650 mg (has no administration in time  range)  magnesium hydroxide (MILK OF MAGNESIA) suspension 30 mL (has no administration in time range)  metoCLOPramide (REGLAN) injection 5 mg (has no administration in time range)  melatonin tablet 3 mg (has no administration in time range)  fentaNYL (SUBLIMAZE) injection 50 mcg (50 mcg Intravenous Given 01/16/23 0455)  potassium chloride SA (KLOR-CON M) CR tablet 40 mEq (40 mEq Oral Given 01/16/23 0551)  methocarbamol (ROBAXIN) injection 500 mg (500 mg Intravenous Given 01/16/23 0619)  ketorolac (TORADOL) 30 MG/ML injection 15 mg (15 mg Intravenous Given 01/16/23 [id number])     ED COURSE: No leukocytosis on lab work.  Normal hemoglobin.  Potassium of 2.6.  No vomiting or diarrhea.  She is not on a diuretic.  Will add on a magnesium level.  Will obtain EKG.  Will give oral and IV replacement.   MRIs reviewed and interpreted by myself and the radiologist.  Patient has a subacute T12 body fracture with advanced height loss that has progressed from imaging on 12/11/2022.  She also has progression of retropulsion which is causing lower cord compression.  She has normal sensation and movement of her extremities and no hyperreflexia.  Will discuss with hospitalist for admission.  Anticipate neurosurgery consultation not emergently.  Patient and son comfortable with this plan.  She is feeling better after fentanyl but still having some muscle spasm causing pains to shoot down both legs.  Will give dose of Robaxin.   CONSULTS:  Consulted and discussed patient's case with hospitalist, Dr. Sidney Ace.  I have recommended admission and consulting physician agrees and will place admission orders.  Patient (and family if present) agree with this plan.   I reviewed all nursing notes, vitals, pertinent previous records.  All labs, EKGs, imaging ordered have been independently reviewed and interpreted by myself.   Secure chat sent to Dr. Izora Ribas with neurosurgery.  Will see in consultation.  Will also order patient TLSO  brace per neurosurgery recommendations.   OUTSIDE RECORDS REVIEWED: Reviewed last PCP note on 01/04/2023.       FINAL CLINICAL IMPRESSION(S) / ED DIAGNOSES   Final diagnoses:  Mid back pain  Frequent falls  Hypokalemia  T12 compression fracture, with routine healing, subsequent encounter     Rx / DC Orders   ED Discharge Orders     None  Note:  This document was prepared using Dragon voice recognition software and may include unintentional dictation errors.   Bernell Sigal, Delice Bison, DO 01/16/23 [place], Delice Bison, DO 01/16/23 [id number]

## 2023-01-16 NOTE — H&P (Signed)
History and Physical    Caroline Zamora [id number] DOB: 04-May-1941 DOA: 01/16/2023  Referring MD/NP/PA:   PCP: Kirk Ruths, MD   Patient coming from:  The patient is coming from home.    Chief Complaint: Frequent fall and back pain  HPI: Caroline Zamora is a 82 y.o. female with medical history significant of hypertension, hyperlipidemia, diastolic CHF, hypothyroidism, depression with anxiety, CKD 3A, PAF on Eliquis, colon cancer (s/p of surgery), orthostatic hypotension, who presents with frequent fall and back pain.   Patient states that recently she has had multiple falls.  She injured her back, causing back pain.  She was seen in the ED. CT of T-spine on 12/11/2022 showed acute burst type compression fracture involving the T12 vertebral body with up to 45% height loss and 6 mm bony retropulsion. She continues to have back pain, which is located in the middle and lower back, constant, sharp, moderate to severe, radiating to both legs.  No loss control for bladder or bowel movement.  She does not have numbness in legs, but reports bilateral leg weakness.  On my examination, her leg muscle strength seems to be okay.  Patient does not have chest pain, cough, shortness breath.  No nausea, vomiting, diarrhea or abdominal pain.  No symptoms of UTI.  Patient took her last dose of Eliquis yesterday evening.  CT-T spin on 12/11/22 1. Acute burst type compression fracture involving the T12 vertebral body with up to 45% height loss and 6 mm bony retropulsion. 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 2. Additional subacute fractures of the right posterior ninth, tenth, and eleventh ribs. 3. Mild chronic compression deformities involving the superior endplates of T2 and T3 without bony retropulsion. 4. 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 5. Hepatic steatosis.   Data reviewed independently and ED Course: pt was found to have WBC 5.3, renal function close to  baseline, potassium 2.6, magnesium 2.0, temperature normal, blood pressure 119/104, heart rate 77, RR 20, oxygen saturation 100% on room air.  MRI-T spin 1. Subacute T12 body fracture with advanced height loss that is progressed from 12/11/2022 CT. Also progressed is retropulsion which causes lower cord compression. 2. Chronic/degenerative changes as described.  MRI-L spin: 1. Subacute T12 body fracture with advanced height loss that is progressed from 12/11/2022 CT. Also progressed is retropulsion which causes lower cord compression. 2. Chronic/degenerative changes as described.    EKG: Not done in ED, will get one.      Review of Systems:   General: no fevers, chills, no body weight gain,  has fatigue HEENT: no blurry vision, hearing changes or sore throat Respiratory: no dyspnea, coughing, wheezing CV: no chest pain, no palpitations GI: no nausea, vomiting, abdominal pain, diarrhea, constipation GU: no dysuria, burning on urination, increased urinary frequency, hematuria  Ext: no leg edema Neuro: no unilateral weakness, numbness, or tingling, no vision change or hearing loss. Has fall Skin: no rash, no skin tear. MSK: has back pain Heme: No easy bruising.  Travel history: No recent long distant travel.   Allergy:  Allergies  Allergen Reactions   Desyrel [Trazodone] Rash   Sulfa Antibiotics Rash    Past Medical History:  Diagnosis Date   Arthritis    lumbar spine   CKD (chronic kidney disease), stage II    Colon cancer (HCC)    Depression    Dysphagia    Headache    migraine   Hx of adenomatous colonic polyps    Hyperlipidemia  Hypertension    PAF (paroxysmal atrial fibrillation) (HCC)    Paroxysmal atrial fibrillation (HCC)    Skin cancer    Syncope    Type 2 diabetes mellitus ([place]) 12/11/2022    Past Surgical History:  Procedure Laterality Date   ABDOMINAL HYSTERECTOMY     with removal of tubes and ovaries   ANKLE FRACTURE SURGERY Right     COLONOSCOPY     COLONOSCOPY WITH PROPOFOL N/A 09/03/2017   Procedure: COLONOSCOPY WITH PROPOFOL;  Surgeon: Manya Silvas, MD;  Location: [facility] ENDOSCOPY;  Service: Endoscopy;  Laterality: N/A;   COLONOSCOPY WITH PROPOFOL N/A 09/13/2020   Procedure: COLONOSCOPY WITH PROPOFOL;  Surgeon: Lesly Rubenstein, MD;  Location: [facility] ENDOSCOPY;  Service: Endoscopy;  Laterality: N/A;   DILATION AND CURETTAGE OF UTERUS     ESOPHAGOGASTRODUODENOSCOPY     ESOPHAGOGASTRODUODENOSCOPY (EGD) WITH PROPOFOL N/A 09/13/2020   Procedure: ESOPHAGOGASTRODUODENOSCOPY (EGD) WITH PROPOFOL;  Surgeon: Lesly Rubenstein, MD;  Location: [facility] ENDOSCOPY;  Service: Endoscopy;  Laterality: N/A;   LEFT HEART CATH AND CORONARY ANGIOGRAPHY N/A 04/17/2022   Procedure: LEFT HEART CATH AND CORONARY ANGIOGRAPHY;  Surgeon: Lorretta Harp, MD;  Location: [facility];  Service: Cardiovascular;  Laterality: N/A;   skin cancer removal     TONSILLECTOMY     TUBAL LIGATION      Social History:  reports that she has never smoked. She has never used smokeless tobacco. She reports that she does not currently use alcohol. She reports that she does not use drugs.  Family History:  Family History  Problem Relation Age of Onset   Diabetes Mellitus II Mother    Atrial fibrillation Father    Breast cancer Neg Hx      Prior to Admission medications   Medication Sig Start Date End Date Taking? Authorizing Provider  acetaminophen (TYLENOL) 500 MG tablet Take 500 mg by mouth every 6 (six) hours as needed for mild pain.    [provider]  amiodarone (PACERONE) 200 MG tablet Take 200 mg by mouth daily.    [provider]  apixaban (ELIQUIS) 5 MG TABS tablet Take 5 mg by mouth 2 (two) times daily.    [provider]  estradiol (ESTRACE) 0.5 MG tablet Take 0.5 mg by mouth at bedtime.    [provider]  levothyroxine (SYNTHROID) 50 MCG tablet Take 50 mcg by mouth daily before breakfast.     [provider]  venlafaxine XR (EFFEXOR-XR) 150 MG 24 hr capsule Take 150 mg by mouth daily. 01/20/22   [provider]    Physical Exam: Vitals:   01/16/23 0745 01/16/23 0821 01/16/23 1414 01/16/23 1534  BP:  (!) 182/79  (!) 144/85  Pulse: 69 72  73  Resp: 20 16 18 16  $ Temp:  98.2 F (36.8 C)  97.9 F (36.6 C)  TempSrc:      SpO2: 96% 99%  94%  Weight:      Height:       General: Not in acute distress HEENT:       Eyes: PERRL, EOMI, no scleral icterus.       ENT: No discharge from the ears and nose, no pharynx injection, no tonsillar enlargement.        Neck: No JVD, no bruit, no mass felt. Heme: No neck lymph node enlargement. Cardiac: S1/S2, RRR, No murmurs, No gallops or rubs. Respiratory: No rales, wheezing, rhonchi or rubs. GI: Soft, nondistended, nontender, no rebound pain, no organomegaly,  BS present. GU: No hematuria Ext: No pitting leg edema bilaterally. 1+DP/PT pulse bilaterally. Musculoskeletal: has tenderness in lower and mid back Skin: No rashes.  Neuro: Alert, oriented X3, cranial nerves II-XII grossly intact, moves all extremities normally.  Psych: Patient is not psychotic, no suicidal or hemocidal ideation.  Labs on Admission: I have personally reviewed following labs and imaging studies  CBC: Recent Labs  Lab 01/16/23 0445  WBC 5.3  NEUTROABS 2.9  HGB 14.0  HCT 42.8  MCV 90.5  PLT [id number]   Basic Metabolic Panel: Recent Labs  Lab 01/16/23 0445  NA 135  K 2.6*  CL 99  CO2 24  GLUCOSE 113*  BUN 21  CREATININE 1.01*  CALCIUM 8.1*  MG 2.0  PHOS 1.7*   GFR: Estimated Creatinine Clearance: 48.6 mL/min (A) (by C-G formula based on SCr of 1.01 mg/dL (H)). Liver Function Tests: No results for input(s): "AST", "ALT", "ALKPHOS", "BILITOT", "PROT", "ALBUMIN" in the last 168 hours. No results for input(s): "LIPASE", "AMYLASE" in the last 168 hours. No results for input(s): "AMMONIA" in the last 168 hours. Coagulation  Profile: Recent Labs  Lab 01/16/23 0802  INR 1.3*   Cardiac Enzymes: No results for input(s): "CKTOTAL", "CKMB", "CKMBINDEX", "TROPONINI" in the last 168 hours. BNP (last 3 results) No results for input(s): "PROBNP" in the last 8760 hours. HbA1C: No results for input(s): "HGBA1C" in the last 72 hours. CBG: No results for input(s): "GLUCAP" in the last 168 hours. Lipid Profile: No results for input(s): "CHOL", "HDL", "LDLCALC", "TRIG", "CHOLHDL", "LDLDIRECT" in the last 72 hours. Thyroid Function Tests: No results for input(s): "TSH", "T4TOTAL", "FREET4", "T3FREE", "THYROIDAB" in the last 72 hours. Anemia Panel: No results for input(s): "VITAMINB12", "FOLATE", "FERRITIN", "TIBC", "IRON", "RETICCTPCT" in the last 72 hours. Urine analysis:    Component Value Date/Time   COLORURINE YELLOW (A) 12/28/2022 1442   APPEARANCEUR HAZY (A) 12/28/2022 1442   LABSPEC 1.014 12/28/2022 1442   PHURINE 6.0 12/28/2022 1442   GLUCOSEU NEGATIVE 12/28/2022 1442   HGBUR NEGATIVE 12/28/2022 1442   BILIRUBINUR NEGATIVE 12/28/2022 1442   KETONESUR NEGATIVE 12/28/2022 1442   PROTEINUR 30 (A) 12/28/2022 1442   NITRITE NEGATIVE 12/28/2022 1442   LEUKOCYTESUR NEGATIVE 12/28/2022 1442   Sepsis Labs: @LABRCNTIP$ (procalcitonin:4,lacticidven:4) )No results found for this or any previous visit (from the past 240 hour(s)).   Radiological Exams on Admission: MR THORACIC SPINE WO CONTRAST  Result Date: 01/16/2023 CLINICAL DATA:  Recurrent back pain for 6 months. EXAM: MRI THORACIC AND LUMBAR SPINE WITHOUT CONTRAST TECHNIQUE: Multiplanar and multiecho pulse sequences of the thoracic and lumbar spine were obtained without intravenous contrast. COMPARISON:  12/11/2022 thoracic spine CT FINDINGS: MRI THORACIC SPINE FINDINGS Alignment:  Mild spinal curvature.  No traumatic listhesis. Vertebrae: Subacute T12 fracture with advanced height loss that is progressed from before. Posterosuperior corner retropulsion  compressing the lower cord. Cord: Lower cord compression as described above. No visible cord edema. Paraspinal and other soft tissues: Perivertebral edema and strain at the level of T12 fracture. Disc levels: Notable degenerative change: T3-4 small right paracentral herniation with ventral cord contact. T5-6 small central disc protrusion that is shallow. MRI LUMBAR SPINE FINDINGS Segmentation:  5 lumbar type vertebrae Alignment:  Unremarkable Vertebrae:  No fracture, evidence of discitis, or bone lesion. Conus medullaris and cauda equina: Conus extends to the L1 level. Conus and cauda equina appear normal. Paraspinal and other soft tissues: Negative for paravertebral mass or inflammation Disc levels: L1-L2: Ventral spondylitic spurring.  Mild disc bulging. L2-L3:  Disc narrowing and bulging with bilateral inferior foraminal herniation. Patent canal and foramina L3-L4: Degenerative facet spurring with ligamentum flavum thickening. The disc is narrowed and circumferentially bulging. Noncompressive thecal sac narrowing L4-L5: Disc narrowing with endplate and facet spurring bilaterally. The canal and foramina are patent L5-S1:Disc bulging eccentric to the left. Moderate degenerative facet spurring. IMPRESSION: 1. Subacute T12 body fracture with advanced height loss that is progressed from 12/11/2022 CT. Also progressed is retropulsion which causes lower cord compression. 2. Chronic/degenerative changes as described. Electronically Signed   By: Jorje Guild M.D.   On: 01/16/2023 05:39   MR LUMBAR SPINE WO CONTRAST  Result Date: 01/16/2023 CLINICAL DATA:  Recurrent back pain for 6 months. EXAM: MRI THORACIC AND LUMBAR SPINE WITHOUT CONTRAST TECHNIQUE: Multiplanar and multiecho pulse sequences of the thoracic and lumbar spine were obtained without intravenous contrast. COMPARISON:  12/11/2022 thoracic spine CT FINDINGS: MRI THORACIC SPINE FINDINGS Alignment:  Mild spinal curvature.  No traumatic listhesis. Vertebrae:  Subacute T12 fracture with advanced height loss that is progressed from before. Posterosuperior corner retropulsion compressing the lower cord. Cord: Lower cord compression as described above. No visible cord edema. Paraspinal and other soft tissues: Perivertebral edema and strain at the level of T12 fracture. Disc levels: Notable degenerative change: T3-4 small right paracentral herniation with ventral cord contact. T5-6 small central disc protrusion that is shallow. MRI LUMBAR SPINE FINDINGS Segmentation:  5 lumbar type vertebrae Alignment:  Unremarkable Vertebrae:  No fracture, evidence of discitis, or bone lesion. Conus medullaris and cauda equina: Conus extends to the L1 level. Conus and cauda equina appear normal. Paraspinal and other soft tissues: Negative for paravertebral mass or inflammation Disc levels: L1-L2: Ventral spondylitic spurring.  Mild disc bulging. L2-L3: Disc narrowing and bulging with bilateral inferior foraminal herniation. Patent canal and foramina L3-L4: Degenerative facet spurring with ligamentum flavum thickening. The disc is narrowed and circumferentially bulging. Noncompressive thecal sac narrowing L4-L5: Disc narrowing with endplate and facet spurring bilaterally. The canal and foramina are patent L5-S1:Disc bulging eccentric to the left. Moderate degenerative facet spurring. IMPRESSION: 1. Subacute T12 body fracture with advanced height loss that is progressed from 12/11/2022 CT. Also progressed is retropulsion which causes lower cord compression. 2. Chronic/degenerative changes as described. Electronically Signed   By: Jorje Guild M.D.   On: 01/16/2023 05:39      Assessment/Plan Principal Problem:   Intractable back pain Active Problems:   Closed fracture of T12 vertebra (HCC)   Fall at home, initial encounter   Atrial fibrillation, chronic (HCC)   HTN (hypertension)   Chronic kidney disease, stage 3a (HCC)   Hypokalemia   Hypophosphatemia   Chronic diastolic CHF  (congestive heart failure) (HCC)   Depression with anxiety   Hypothyroidism   Orthostatic hypotension   Assessment and Plan:  Intractable back pain due to closed fracture of T12 vertebra: MRI showed subacute T12 body fracture with advanced height loss that is progressed from 12/11/2022 CT. Also progressed is retropulsion which causes lower cord compression. Consulted Dr. Cari Caraway.  -admit to med-surg bed as inpt -As needed morphine, Percocet, Tylenol -Lidoderm patch -As needed Robaxin -TLSO  Fall at home, initial encounter -Fall precaution -PT/OT if cleared by neurosurgeon  Atrial fibrillation, chronic ([place]) -Continue Eliquis -Amiodarone  HTN (hypertension): Patient's not taking medications.  Blood pressure 119/104 -IV hydralazine as needed  Chronic kidney disease, stage 3a ([place]): Stable, creatinine 1.01, BUN 21, GFR 57 -Follow-up with BMP  Hypokalemia and hypophosphatemia: Potassium 2.6, magnesium 2.0, phosphorus 1.7 -Repleted  potassium and phosphorus  Chronic diastolic CHF (congestive heart failure) ([place]): 2D echo 12/12/2022 showed EF of 55-60% with grade 1 diastolic dysfunction.  Patient does not have leg edema or JVD.  BNP normal 30.6.  CHF is compensated. -Watch volume status closely  Depression with anxiety -Continue home medications  Hypothyroidism: -Synthroid  Orthostatic hypotension: -Continue Florinef    DVT ppx: on Eliquis   Code Status: Full code  Family Communication: I have tried to call her son who did not pick up the phone, I left a message to him  Disposition Plan:  Anticipate discharge back to previous environment  Consults called:  Dr. Cari Caraway of neurosurgery  Admission status and Level of care: Med-Surg:   as inpt        Dispo: The patient is from: Home              Anticipated d/c is to: Home              Anticipated d/c date is: 2 days              Patient currently is not medically stable to d/c.    Severity of  Illness:  The appropriate patient status for this patient is INPATIENT. Inpatient status is judged to be reasonable and necessary in order to provide the required intensity of service to ensure the patient's safety. The patient's presenting symptoms, physical exam findings, and initial radiographic and laboratory data in the context of their chronic comorbidities is felt to place them at high risk for further clinical deterioration. Furthermore, it is not anticipated that the patient will be medically stable for discharge from the hospital within 2 midnights of admission.   * I certify that at the point of admission it is my clinical judgment that the patient will require inpatient hospital care spanning beyond 2 midnights from the point of admission due to high intensity of service, high risk for further deterioration and high frequency of surveillance required.*         Date of Service 01/16/2023    Ivor Costa [facility]   If 7PM-7AM, please contact night-coverage [email or web address] 01/16/2023, 3:42 PM

## 2023-01-17 ENCOUNTER — Inpatient Hospital Stay: Payer: Medicare HMO

## 2023-01-17 DIAGNOSIS — W19XXXA Unspecified fall, initial encounter: Secondary | ICD-10-CM | POA: Diagnosis not present

## 2023-01-17 DIAGNOSIS — N1831 Chronic kidney disease, stage 3a: Secondary | ICD-10-CM | POA: Diagnosis not present

## 2023-01-17 DIAGNOSIS — M4804 Spinal stenosis, thoracic region: Secondary | ICD-10-CM | POA: Diagnosis not present

## 2023-01-17 DIAGNOSIS — S22082A Unstable burst fracture of T11-T12 vertebra, initial encounter for closed fracture: Secondary | ICD-10-CM

## 2023-01-17 DIAGNOSIS — I482 Chronic atrial fibrillation, unspecified: Secondary | ICD-10-CM | POA: Diagnosis not present

## 2023-01-17 DIAGNOSIS — M549 Dorsalgia, unspecified: Secondary | ICD-10-CM | POA: Diagnosis not present

## 2023-01-17 DIAGNOSIS — M532X4 Spinal instabilities, thoracic region: Secondary | ICD-10-CM

## 2023-01-17 LAB — PHOSPHORUS: Phosphorus: 2.9 mg/dL (ref 2.5–4.6)

## 2023-01-17 LAB — BASIC METABOLIC PANEL
Anion gap: 6 (ref 5–15)
BUN: 21 mg/dL (ref 8–23)
CO2: 26 mmol/L (ref 22–32)
Calcium: 7.8 mg/dL — ABNORMAL LOW (ref 8.9–10.3)
Chloride: 104 mmol/L (ref 98–111)
Creatinine, Ser: 0.79 mg/dL (ref 0.44–1.00)
GFR, Estimated: >60 mL/min (ref >60)
Glucose, Bld: 91 mg/dL (ref 70–99)
Potassium: 3.7 mmol/L (ref 3.5–5.1)
Sodium: 136 mmol/L (ref 135–145)

## 2023-01-17 LAB — GLUCOSE, CAPILLARY: Glucose-Capillary: 92 mg/dL (ref 70–99)

## 2023-01-17 MED ORDER — MORPHINE SULFATE (PF) 2 MG/ML IV SOLN
2.0000 mg | INTRAVENOUS | Status: DC | PRN
  Administered 2023-01-17 (×2): 2 mg via INTRAVENOUS
  Filled 2023-01-17 (×2): qty 1

## 2023-01-17 MED ORDER — OXYCODONE-ACETAMINOPHEN 5-325 MG PO TABS
1.0000 | ORAL_TABLET | ORAL | Status: DC | PRN
  Administered 2023-01-17 – 2023-01-18 (×2): 2 via ORAL
  Filled 2023-01-17 (×2): qty 2

## 2023-01-17 MED ORDER — HYDROMORPHONE HCL 1 MG/ML IJ SOLN
2.0000 mg | INTRAMUSCULAR | Status: DC | PRN
  Administered 2023-01-19 (×4): 2 mg via INTRAVENOUS
  Filled 2023-01-17 (×6): qty 2

## 2023-01-17 NOTE — Progress Notes (Signed)
  Progress Note   Patient: Caroline Zamora GTX:[id number] DOB: 1941/05/12 DOA: 01/16/2023     1 DOS: the patient was seen and examined on 01/17/2023   Brief hospital course: 82 y.o. female with medical history significant of hypertension, hyperlipidemia, diastolic CHF, hypothyroidism, depression with anxiety, CKD 3A, PAF on Eliquis, colon cancer (s/p of surgery), orthostatic hypotension, who presents with frequent fall and back pain and was found to have T12 burst fracture  2/13: Neurosurgery consult 2/14: Worsening of T12 burst fracture which is unstable and now requiring surgery which is scheduled for Friday by neurosurgery  Assessment and Plan:  Intractable back pain due to closed fracture of T12 vertebra: MRI showed subacute T12 body fracture with advanced height loss that is progressed from 12/11/2022 CT. Also progressed is retropulsion which causes lower cord compression.  Repeat x-ray today shows worsening of T12 burst fracture with evidence of instability with her thoracic kyphosis now measuring 30 degree versus 18 degree on initial CT scan performed last month Appreciate Dr. Cari Caraway input. -Plan for surgery this Friday.  Son is updated by him -As needed morphine, Percocet, Tylenol -Lidoderm patch -As needed Robaxin -TLSO -Complete bedrest for now   Fall at home, initial encounter -Fall precaution -PT/OT once cleared by neurosurgeon   Atrial fibrillation, chronic ([place]) -Holding Eliquis for scheduled surgery on Friday -Continue amiodarone for rate control   HTN (hypertension): Patient's not taking medications.  -IV hydralazine as needed   Chronic kidney disease, stage 3a ([place]): Stable -Monitor BMP   Hypokalemia and hypophosphatemia: Repleted and resolved   Chronic diastolic CHF (congestive heart failure) ([place]): 2D echo 12/12/2022 showed EF of 55-60% with grade 1 diastolic dysfunction.  Patient does not have leg edema or JVD.  BNP normal 30.6.  CHF is  compensated. -Watch volume status closely   Depression with anxiety -Continue home medications   Hypothyroidism: -Continue Synthroid   Orthostatic hypotension: -Continue Florinef       Subjective: Acute worsening of her back pain after bath today.  She was almost in tears with her uncontrolled pain in very uncomfortable in any position.  Physical Exam: Vitals:   01/16/23 1534 01/17/23 0001 01/17/23 0747 01/17/23 1600  BP: (!) 144/85 (!) 169/89 (!) 163/92 (!) 170/80  Pulse: 73 79 70 74  Resp: '16 20 18 18  '$ Temp: 97.9 F (36.6 C) 98.2 F (36.8 C) 98 F (36.7 C) 98 F (36.7 C)  TempSrc:      SpO2: 94% 95% 96% 96%  Weight:      Height:       General: 82 year old female looking very uncomfortable and in a lot of pain.  She is moving around in the bed to find comfortable position Cardiac: S1/S2, RRR, No murmurs, No gallops or rubs. Respiratory: No rales, wheezing, rhonchi or rubs. GI: Soft, benign Ext: No pitting leg edema bilaterally. 1+DP/PT pulse bilaterally. Musculoskeletal: has tenderness in lower and mid back Skin: No rashes.  Neuro: Alert, oriented X3, nonfocal Psych: Patient is not psychotic, no suicidal or hemocidal ideation. Data Reviewed:  Repeat x-ray shows worsening of her T12 burst fracture  Family Communication: Son updated by Dr. Izora Ribas  Disposition: Status is: Inpatient Remains inpatient appropriate because: Planned surgery for her spinal fracture  Planned Discharge Destination: Skilled nursing facility   DVT prophylaxis-SCDs Time spent: 35 minutes  Author: Max Sane, MD 01/17/2023 5:36 PM  For on call review [email or web address].

## 2023-01-17 NOTE — Plan of Care (Signed)
  Problem: Activity: Goal: Risk for activity intolerance will decrease Outcome: Progressing   Problem: Coping: Goal: Level of anxiety will decrease Outcome: Progressing   Problem: Pain Managment: Goal: General experience of comfort will improve Outcome: Progressing   Problem: Safety: Goal: Ability to remain free from injury will improve Outcome: Progressing   

## 2023-01-17 NOTE — Progress Notes (Signed)
    Attending Progress Note  History: Caroline Zamora is here for T12 fracture after multiple falls.  HD2: During her bath today, the patient developed worsening pain in her back.  She is now unable to achieve any sort of pain relief when she moves.  She also has some cramping in her lower extremities that was not previously present.  She is able to feel her perineum and has control over her bowels and bladder function.  That being said, she has some cramping that was not previously present.  Her pain is quite different than it was prior to this event today.  Physical Exam: Vitals:   01/17/23 0001 01/17/23 0747  BP: (!) 169/89 (!) 163/92  Pulse: 79 70  Resp: 20 18  Temp: 98.2 F (36.8 C) 98 F (36.7 C)  SpO2: 95% 96%    AA Ox3 CNI  Strength:5/5 throughout BUE and BLE  Data:  Other tests/results:  Xrays 01/17/2023 IMPRESSION: 1. Unchanged severe T12 burst fracture.     Electronically Signed   By: Titus Dubin M.D.   On: 01/17/2023 15:21  Please note that the comparison study for this x-ray was her MRI scan from yesterday.  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  On her standing x-rays today, that kyphosis has increased to 30 degrees.  MRI TL spine 01/16/2023 IMPRESSION: 1. Subacute T12 body fracture with advanced height loss that is progressed from 12/11/2022 CT. Also progressed is retropulsion which causes lower cord compression. 2. Chronic/degenerative changes as described.     Electronically Signed   By: Jorje Guild M.D.   On: 01/16/2023 05:39    Assessment/Plan:  Caroline Zamora has suffered worsening of a T12 burst fracture that shows evidence of instability with her thoracic kyphosis that now measures 30 degrees versus 18 degrees when she had her CT scan performed last month.  Based on this and her worsening pain, I feel that this represents  a failure of external orthosis.  Based on her worsening thoracic kyphosis, there is implied thoracic instability.  In addition, she has new cramping in her upper legs which could be secondary to thoracic stenosis as evident on her MRI scan of the thoracic spine.  I have recommended surgical intervention.  We will plan for T10- L2 spinal fusion and open reduction internal fixation of her T12 fracture as well as a T12 laminectomy for decompression of the spinal cord.  Due to her having received Eliquis today, will plan for surgery on Friday.  I discussed the planned procedure at length with the patient, including the risks, benefits, alternatives, and indications. The risks discussed include but are not limited to bleeding, infection, need for reoperation, spinal fluid leak, stroke, vision loss, anesthetic complication, coma, paralysis, and even death. I also described in detail that improvement was not guaranteed.  The patient expressed understanding of these risks, and asked that we proceed with surgery. I described the surgery in layman's terms, and gave ample opportunity for questions, which were answered to the best of my ability.  Meade Maw MD, [facility] Department of Neurosurgery

## 2023-01-17 NOTE — Plan of Care (Signed)

## 2023-01-17 NOTE — Hospital Course (Addendum)
82 y.o. female with medical history significant of hypertension, hyperlipidemia, diastolic CHF, hypothyroidism, depression with anxiety, CKD 3A, PAF on Eliquis, colon cancer (s/p of surgery), orthostatic hypotension, who presents with frequent fall and back pain and was found to have T12 burst fracture  2/13: Neurosurgery consult 2/14: Worsening of T12 burst fracture which is unstable and now requiring surgery which is scheduled for Friday by neurosurgery 2/15: Transfer to stepdown and intensivist consult.  Dilaudid pump started, Lyrica 25 mg 3 times daily and 1 dose of Decadron 2/16: T10-L2 posterior fusion 2/17: Precedex started, psych consult 2/18: Off Precedex.  Started Seroquel and Depakote for mood.  Transfer to floor 2/19: Less agitated, still has one-to-one sitter as she requires close monitoring for her behavioral and safety 2/20: Her mental status is back to baseline.  Delirium seems to have resolved.  Stopped all narcotics and also discontinued Seroquel

## 2023-01-17 NOTE — TOC Progression Note (Signed)
Transition of Care [facility]) - Progression Note    Patient Details  Name: Caroline Zamora MRN: [id number] Date of Birth: 11-04-1941  Transition of Care [facility]) CM/SW [place], RN Phone Number: 01/17/2023, 10:58 AM  Clinical Narrative:     TOC to continue to follow and assist with DC planning, Per Neuro notes, they polan for her to follow up outpatient with Neurosurgey, she has 2 walkers and a cane at home   Expected Discharge Plan: [place] Barriers to Discharge: Continued Medical Work up  Expected Discharge Plan and Services   Discharge Planning Services: CM Consult   Living arrangements for the past 2 months: Single Family Home                 DME Arranged: N/A                     Social Determinants of Health (SDOH) Interventions SDOH Screenings   Food Insecurity: No Food Insecurity (12/12/2022)  Housing: Low Risk  (12/12/2022)  Transportation Needs: No Transportation Needs (12/12/2022)  Utilities: Not At Risk (12/12/2022)  Tobacco Use: Low Risk  (01/11/2023)    Readmission Risk Interventions     No data to display

## 2023-01-18 ENCOUNTER — Inpatient Hospital Stay: Payer: Medicare HMO

## 2023-01-18 DIAGNOSIS — S22082A Unstable burst fracture of T11-T12 vertebra, initial encounter for closed fracture: Secondary | ICD-10-CM | POA: Diagnosis not present

## 2023-01-18 DIAGNOSIS — M549 Dorsalgia, unspecified: Secondary | ICD-10-CM

## 2023-01-18 DIAGNOSIS — E876 Hypokalemia: Secondary | ICD-10-CM | POA: Diagnosis not present

## 2023-01-18 DIAGNOSIS — I482 Chronic atrial fibrillation, unspecified: Secondary | ICD-10-CM | POA: Diagnosis not present

## 2023-01-18 LAB — GLUCOSE, CAPILLARY: Glucose-Capillary: 121 mg/dL — ABNORMAL HIGH (ref 70–99)

## 2023-01-18 LAB — TYPE AND SCREEN
ABO/RH(D): O POS
Antibody Screen: NEGATIVE

## 2023-01-18 LAB — ABO/RH: ABO/RH(D): O POS

## 2023-01-18 LAB — SURGICAL PCR SCREEN
MRSA, PCR: NEGATIVE
Staphylococcus aureus: NEGATIVE

## 2023-01-18 MED ORDER — OXYCODONE HCL 5 MG PO TABS
5.0000 mg | ORAL_TABLET | ORAL | Status: DC | PRN
  Administered 2023-01-19 – 2023-01-21 (×5): 5 mg via ORAL
  Filled 2023-01-18 (×5): qty 1

## 2023-01-18 MED ORDER — CHLORHEXIDINE GLUCONATE CLOTH 2 % EX PADS
6.0000 | MEDICATED_PAD | Freq: Every day | CUTANEOUS | Status: DC
  Administered 2023-01-18 – 2023-01-25 (×8): 6 via TOPICAL

## 2023-01-18 MED ORDER — MUPIROCIN 2 % EX OINT
1.0000 | TOPICAL_OINTMENT | Freq: Two times a day (BID) | CUTANEOUS | Status: AC
  Administered 2023-01-18 – 2023-01-22 (×9): 1 via NASAL
  Filled 2023-01-18 (×2): qty 22

## 2023-01-18 MED ORDER — HYDROMORPHONE 1 MG/ML IV SOLN
INTRAVENOUS | Status: DC
  Administered 2023-01-18: 30 mg via INTRAVENOUS
  Filled 2023-01-18: qty 30

## 2023-01-18 MED ORDER — METHOCARBAMOL 500 MG PO TABS
500.0000 mg | ORAL_TABLET | Freq: Four times a day (QID) | ORAL | Status: DC
  Administered 2023-01-18 – 2023-01-23 (×20): 500 mg via ORAL
  Filled 2023-01-18 (×26): qty 1

## 2023-01-18 MED ORDER — DEXAMETHASONE SODIUM PHOSPHATE 10 MG/ML IJ SOLN
10.0000 mg | Freq: Once | INTRAMUSCULAR | Status: AC
  Administered 2023-01-18: 10 mg via INTRAVENOUS
  Filled 2023-01-18: qty 1

## 2023-01-18 MED ORDER — NALOXONE HCL 0.4 MG/ML IJ SOLN: 0.4000 mg | INTRAMUSCULAR | Status: DC | PRN

## 2023-01-18 MED ORDER — ACETAMINOPHEN 500 MG PO TABS
1000.0000 mg | ORAL_TABLET | Freq: Four times a day (QID) | ORAL | Status: DC
  Administered 2023-01-18 – 2023-01-26 (×26): 1000 mg via ORAL
  Filled 2023-01-18 (×33): qty 2

## 2023-01-18 MED ORDER — PREGABALIN 25 MG PO CAPS
25.0000 mg | ORAL_CAPSULE | Freq: Three times a day (TID) | ORAL | Status: DC
  Administered 2023-01-18 – 2023-01-20 (×6): 25 mg via ORAL
  Filled 2023-01-18 (×8): qty 1

## 2023-01-18 MED ORDER — SODIUM CHLORIDE 0.9% FLUSH: 9.0000 mL | INTRAVENOUS | Status: DC | PRN

## 2023-01-18 NOTE — Progress Notes (Signed)
Patient transferred to CCU 16, on PCA pump. All belongings with pt.

## 2023-01-18 NOTE — Progress Notes (Signed)
Progress Note   Patient: Caroline Zamora [id number] DOB: [birth year] DOA: 01/16/2023     2 DOS: the patient was seen and examined on 01/18/2023   Brief hospital course: 82 y.o. female with medical history significant of hypertension, hyperlipidemia, diastolic CHF, hypothyroidism, depression with anxiety, CKD 3A, PAF on Eliquis, colon cancer (s/p of surgery), orthostatic hypotension, who presents with frequent fall and back pain and was found to have T12 burst fracture  2/13: Neurosurgery consult 2/14: Worsening of T12 burst fracture which is unstable and now requiring surgery which is scheduled for Friday by neurosurgery 2/15: Transfer to stepdown and intensivist consult.  Dilaudid pump started, Lyrica 25 mg 3 times daily and 1 dose of Decadron  Assessment and Plan: 82 y.o. female with medical history significant of hypertension, hyperlipidemia, diastolic CHF, hypothyroidism, depression with anxiety, CKD 3A, PAF on Eliquis, colon cancer (s/p of surgery), orthostatic hypotension, who presents with frequent fall and back pain and was found to have T12 burst fracture   2/13: Neurosurgery consult 2/14: Worsening of T12 burst fracture which is unstable and now requiring surgery which is scheduled for Friday by neurosurgery   Assessment and Plan:   Intractable back pain due to closed fracture of T12 vertebra: MRI showed subacute T12 body fracture with advanced height loss that is progressed from 12/11/2022 CT. Also progressed is retropulsion which causes lower cord compression.  Repeat x-ray on 2/14 showed worsening of T12 burst fracture with evidence of instability with her thoracic kyphosis now measuring 30 degree versus 18 degree on initial CT scan performed last month Appreciate neurosurgery input.  Plan for surgery tomorrow -Started on Dilaudid pump for better pain control as she was having excruciating pain and was very uncomfortable despite IV Dilaudid and oral Percocet -1 dose of  Decadron per neurosurgery.  Started on Lyrica 25 mg p.o. 3 times daily -Complete bedrest for now -Transfer to stepdown for close monitoring as she may need Precedex drip if not better with above changes -She is tossing and turning and seems very uncomfortable and possible panic -Intensivist consult while in stepdown as she remains very high risk for rapid response and or potential code due to her pain management issue   Fall at home, initial encounter -Fall precaution -PT/OT once cleared by neurosurgeon   Atrial fibrillation, chronic ([place]) -Holding Eliquis for scheduled surgery on Friday -Continue amiodarone for rate control   HTN (hypertension): Patient's not taking medications.  -IV hydralazine as needed   Chronic kidney disease, stage 3a ([place]): Stable -Monitor BMP   Hypokalemia and hypophosphatemia: Repleted and resolved   Chronic diastolic CHF (congestive heart failure) ([place]): 2D echo 12/12/2022 showed EF of 55-60% with grade 1 diastolic dysfunction.  Patient does not have leg edema or JVD.  BNP normal 30.6.  CHF is compensated. -Watch volume status closely   Depression with anxiety -Continue home medications   Hypothyroidism: -Continue Synthroid   Orthostatic hypotension: -Continue Florinef             Subjective: Still very uncomfortable when I saw her, continued severe back pain with on ability to achieve any sort of reasonable pain control.  She is also having leg cramps now  Physical Exam: Vitals:   01/18/23 1622 01/18/23 2000 01/18/23 2027 01/18/23 2030  BP:      Pulse:      Resp: 18 20 20   $ Temp:    (!) 97.3 F (36.3 C)  TempSrc:    Oral  SpO2: 94% 96% 98%  Weight:      Height:       General: 82 year old female looking very uncomfortable and in a lot of pain.  She is moving around in the bed to find comfortable position Cardiac: S1/S2, RRR, No murmurs, No gallops or rubs. Respiratory: No rales, wheezing, rhonchi or rubs. GI: Soft, benign Ext: No  pitting leg edema bilaterally. 1+DP/PT pulse bilaterally. Musculoskeletal: has tenderness in lower and mid back Skin: No rashes.  Neuro: Alert, oriented X3, nonfocal Psych: Seems anxious and may be having panic spells Data Reviewed:  There are no new results to review at this time.  Family Communication: Son updated at bedside  Disposition: Status is: Inpatient Remains inpatient appropriate because: Management of spinal fracture and pain control  Planned Discharge Destination: Skilled nursing facility   DVT prophylaxis-SCDs Time spent: 35 minutes  Author: Max Sane, MD 01/18/2023 10:40 PM  For on call review [email or web address].

## 2023-01-18 NOTE — Progress Notes (Signed)
1125 CO2 sensor placed on pt, CO2 reading 35, RR 13. Pt being transported to ICU by primary nurse.

## 2023-01-18 NOTE — Progress Notes (Addendum)
    Attending Progress Note  History: Caroline Zamora is here for T12 fracture after multiple falls.  HD3: Pt reports continued severe back pain with significant pain in her legs, particularly her right thigh  HD2: During her bath today, the patient developed worsening pain in her back.  She is now unable to achieve any sort of pain relief when she moves.  She also has some cramping in her lower extremities that was not previously present.  She is able to feel her perineum and has control over her bowels and bladder function.  That being said, she has some cramping that was not previously present.  Her pain is quite different than it was prior to this event today.  Physical Exam: Vitals:   01/18/23 0214 01/18/23 0810  BP: (!) 147/75 (!) 188/93  Pulse: 87 91  Resp: 19 (!) 24  Temp: 97.9 F (36.6 C) 98.6 F (37 C)  SpO2: 97% 98%    AA Ox3 CNI  Strength:MAEW with significant pain. Pt unable to participate in strength testing due to pain  Data:  Other tests/results:  Xrays 01/17/2023 IMPRESSION: 1. Unchanged severe T12 burst fracture.     Electronically Signed   By: Titus Dubin M.D.   On: 01/17/2023 15:21  Please note that the comparison study for this x-ray was her MRI scan from yesterday.  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  On her standing x-rays today, that kyphosis has increased to 30 degrees.  MRI TL spine 01/16/2023 IMPRESSION: 1. Subacute T12 body fracture with advanced height loss that is progressed from 12/11/2022 CT. Also progressed is retropulsion which causes lower cord compression. 2. Chronic/degenerative changes as described.     Electronically Signed   By: Jorje Guild M.D.   On: 01/16/2023 05:39    Assessment/Plan:  Caroline Zamora has suffered worsening of a T12 burst fracture that shows evidence of instability with her thoracic  kyphosis that now measures 30 degrees versus 18 degrees when she had her CT scan performed last month.  - Added Decadron '10mg'$  x 1, Lyrica '25mg'$  TID, and started Diluadid PCA due to patients severe pain - continue to hold Eliquis for anticipated T10-L2 fusion tomorrow (01/19/23) - Ok for DVT ppx. Will hold day of surgery - NPO at midnight  - remainder of care per primary team - Please call with any changes to patients neurologic exam, questions, or concerns.  Cooper Render PA-C Department of Neurosurgery

## 2023-01-18 NOTE — Plan of Care (Signed)

## 2023-01-18 NOTE — Progress Notes (Addendum)
NAME:  Caroline Zamora, MRN:  [id number], DOB:  [birth year], LOS: 2 ADMISSION DATE:  01/16/2023, CONSULTATION DATE:  01/18/23 REFERRING MD:  Dr. Manuella Ghazi, CHIEF COMPLAINT:  Frequent falls & back pain   History of Present Illness:  82 year old female presenting to [facility] ED from home on 01/16/23 with complaints of multiple falls and back pain.   History provided via chart review and patient bedside report. She fell at the beginning of January and developed an acute burst T12 compression fracture which she has been treating with Tylenol. She has had several falls since then with no further imaging of her back performed. She had been scheduled for an MRI on 2/12 but was unable to make it due pain. She denied numbness, tingling, overflow incontinence, bowel incontinence, urinary retention. She was complaining of diffuse mid thoracic and upper lumbar back pain. She had been unable to ambulate due to pain. She described her falls as getting dizzy then falling to the ground without losing consciousness. Her anti-hypertensive medications were being adjusted due to concerns of orthostatic hypotension.  ED course: Upon arrival vitals stable, patient unable to ambulate due to pain. Labs significant for hypokalemia at 2.6, with renal function close to baseline. No leukocytosis present. Imaging revealed a subacute burst type fracture of the T12 verebral body with progressed height loss and lower cord compression. TRH consulted for admission and neurosurgery consulted for intervention.   Hospital course: See significant events  Significant Labs & Imaging: CXR 01/18/23: No active disease CT Thoracic spine wo contrast 12/11/22:  1. Acute burst type compression fracture involving the T12 vertebral body with up to 45% height loss and 6 mm bony retropulsion. 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 2. Additional subacute fractures of the right posterior ninth, tenth, and eleventh ribs. 3. Mild chronic  compression deformities involving the superior endplates of T2 and T3 without bony retropulsion. 4. 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 5. Hepatic steatosis.  MRI thoracic spine & lumbar spine wo contrast 01/16/23: . Subacute T12 body fracture with advanced height loss that is progressed from 12/11/2022 CT. Also progressed is retropulsion which causes lower cord compression. Chronic/degenerative changes as described.  PCCM consulted for assistance in management and monitoring due to high opioid requirement with concern for respiratory depression.  Pertinent  Medical History  PAF on Eliquis T2DM HLD HTN Dysphagia Colon Cancer s/p surgery CKD Stage Hypothyroidism HFrEF Depression & anixety CKD stage 3a Prolonged Qtc Significant Hospital Events: Including procedures, antibiotic start and stop dates in addition to other pertinent events   2/13: Neurosurgery consult 2/14: Worsening of T12 burst fracture which is unstable and now requiring surgery which is scheduled for Friday by neurosurgery due to Eliquis washout 01/18/23: transferred to SDU for dilaudid PCA and monitoring pre-surgery. PCCM consulted  Interim History / Subjective:  Patient A&O with PCA running, no current complaints of pain.  Objective   Blood pressure (!) 142/85, pulse 84, temperature (!) 97.3 F (36.3 C), resp. rate 20, height 5' 6"$  (1.676 m), weight 81.6 kg, SpO2 98 %.        Intake/Output Summary (Last 24 hours) at 01/18/2023 2045 Last data filed at 01/18/2023 1514 Gross per 24 hour  Intake 270 ml  Output 950 ml  Net -680 ml   Filed Weights   01/16/23 0443  Weight: 81.6 kg    Examination: General: Adult female, acutely ill, lying in bed, NAD HEENT: MM pink/moist, anicteric, atraumatic, neck supple Neuro: A&O x 2-3, able to follow commands,  PERRL +3, MAE- back brace in place CV: s1s2 RRR, NSR on monitor, no r/m/g Pulm: Regular, non labored on 2 L [place], breath sounds clear-BUL & diminished-BLL GI:  soft, rounded, non tender, bs x 4 GU: foley in place with clear yellow urine Skin: limited exam- back brace in place- scabbed abrasion noted on LLE Extremities: warm/dry, pulses + 2 R/P, no edema noted  Resolved Hospital Problem list     Assessment & Plan:  Traumatic acute on chronic T12 burst fracture secondary to multiple falls PTA Intractable back pain- improved on PCA Imaging shows evidence of instability with her thoracic kyphosis. Plans for a T12-L2 spinal fusion and open reduction internal fixation of her T12 fracture as well as T12 laminectomy for decompression of spinal cord on 2/16 - Neurosurgery following, appreciate input - NPO at midnight - continue decadron, lyrica and dilaudid PCA per neurosurgery - strict bedrest, back brace in place - falls precautions - continue ETCO2 monitoring with PCA pump  PAF HTN Chronic HFpEF Orthostatic Hypotension PMHx: HFrEF - Eliquis on hold due to surgery in AM - continue amiodarone & florinef - continue IV hydralazine PRN, maintain SBP < 165 - strict I&O's  CKD Stage 3a Hypokalemia & Hypophosphatemia- resolved - Daily BMP, replace electrolytes PRN - Avoid nephrotoxic agents as able, ensure adequate renal perfusion  Hypothyroidism - continue synthroid  Best Practice (right click and "Reselect all SmartList Selections" daily)  Diet/type: Regular consistency (see orders) DVT prophylaxis: SCD GI prophylaxis: N/A Lines: N/A Foley:  N/A Code Status:  full code Last date of multidisciplinary goals of care discussion [per primary]  Labs   CBC: Recent Labs  Lab 01/16/23 0445  WBC 5.3  NEUTROABS 2.9  HGB 14.0  HCT 42.8  MCV 90.5  PLT [id number]    Basic Metabolic Panel: Recent Labs  Lab 01/16/23 0445 01/17/23 0314  NA 135 136  K 2.6* 3.7  CL 99 104  CO2 24 26  GLUCOSE 113* 91  BUN 21 21  CREATININE 1.01* 0.79  CALCIUM 8.1* 7.8*  MG 2.0  --   PHOS 1.7* 2.9   GFR: Estimated Creatinine Clearance: 61.4 mL/min (by  C-G formula based on SCr of 0.79 mg/dL). Recent Labs  Lab 01/16/23 0445  WBC 5.3    Liver Function Tests: No results for input(s): "AST", "ALT", "ALKPHOS", "BILITOT", "PROT", "ALBUMIN" in the last 168 hours. No results for input(s): "LIPASE", "AMYLASE" in the last 168 hours. No results for input(s): "AMMONIA" in the last 168 hours.  ABG No results found for: "PHART", "PCO2ART", "PO2ART", "HCO3", "TCO2", "ACIDBASEDEF", "O2SAT"   Coagulation Profile: Recent Labs  Lab 01/16/23 0802  INR 1.3*    Cardiac Enzymes: No results for input(s): "CKTOTAL", "CKMB", "CKMBINDEX", "TROPONINI" in the last 168 hours.  HbA1C: Hgb A1c MFr Bld  Date/Time Value Ref Range Status  12/11/2022 09:23 AM 5.6 4.8 - 5.6 % Final    Comment:    (NOTE)         Prediabetes: 5.7 - 6.4         Diabetes: >6.4         Glycemic control for adults with diabetes: <7.0   04/16/2022 02:55 AM 5.7 (H) 4.8 - 5.6 % Final    Comment:    (NOTE) Pre diabetes:          5.7%-6.4%  Diabetes:              >6.4%  Glycemic control for   <7.0% adults with diabetes  CBG: Recent Labs  Lab 01/17/23 0744 01/18/23 1140  GLUCAP 92 121*    Review of Systems: positives in BOLD  Gen: Denies fever, chills, weight change, fatigue, night sweats HEENT: Denies blurred vision, double vision, hearing loss, tinnitus, sinus congestion, rhinorrhea, sore throat, neck stiffness, dysphagia PULM: Denies shortness of breath, cough, sputum production, hemoptysis, wheezing CV: Denies chest pain, edema, orthopnea, paroxysmal nocturnal dyspnea, palpitations GI: Denies abdominal pain, nausea, vomiting, diarrhea, hematochezia, melena, constipation, change in bowel habits GU: Denies dysuria, hematuria, polyuria, oliguria, urethral discharge Endocrine: Denies hot or cold intolerance, polyuria, polyphagia or appetite change Derm: Denies rash, dry skin, scaling or peeling skin change Heme: Denies easy bruising, bleeding, bleeding  gums Neuro: Denies headache, numbness, weakness, slurred speech, loss of memory or consciousness, back pain with fall, dizziness prior to falls  Past Medical History:  She,  has a past medical history of Arthritis, CKD (chronic kidney disease), stage II, Colon cancer ([place]), Depression, Dysphagia, Headache, adenomatous colonic polyps, Hyperlipidemia, Hypertension, PAF (paroxysmal atrial fibrillation) ([place]), Paroxysmal atrial fibrillation ([place]), Skin cancer, Syncope, and Type 2 diabetes mellitus ([place]) (12/11/2022).   Surgical History:   Past Surgical History:  Procedure Laterality Date   ABDOMINAL HYSTERECTOMY     with removal of tubes and ovaries   ANKLE FRACTURE SURGERY Right    COLONOSCOPY     COLONOSCOPY WITH PROPOFOL N/A 09/03/2017   Procedure: COLONOSCOPY WITH PROPOFOL;  Surgeon: Manya Silvas, MD;  [facility] ENDOSCOPY;  Service: Endoscopy;  Laterality: N/A;   COLONOSCOPY WITH PROPOFOL N/A 09/13/2020   Procedure: COLONOSCOPY WITH PROPOFOL;  Surgeon: Lesly Rubenstein, MD;  Location: [facility] ENDOSCOPY;  Service: Endoscopy;  Laterality: N/A;   DILATION AND CURETTAGE OF UTERUS     ESOPHAGOGASTRODUODENOSCOPY     ESOPHAGOGASTRODUODENOSCOPY (EGD) WITH PROPOFOL N/A 09/13/2020   Procedure: ESOPHAGOGASTRODUODENOSCOPY (EGD) WITH PROPOFOL;  Surgeon: Lesly Rubenstein, MD;  Location: [facility] ENDOSCOPY;  Service: Endoscopy;  Laterality: N/A;   LEFT HEART CATH AND CORONARY ANGIOGRAPHY N/A 04/17/2022   Procedure: LEFT HEART CATH AND CORONARY ANGIOGRAPHY;  Surgeon: Lorretta Harp, MD;  Location: [place] CV LAB;  Service: Cardiovascular;  Laterality: N/A;   skin cancer removal     TONSILLECTOMY     TUBAL LIGATION       Social History:   reports that she has never smoked. She has never used smokeless tobacco. She reports that she does not currently use alcohol. She reports that she does not use drugs.   Family History:  Her family history includes Atrial fibrillation in her father;  Diabetes Mellitus II in her mother. There is no history of Breast cancer.   Allergies Allergies  Allergen Reactions   Desyrel [Trazodone] Rash   Sulfa Antibiotics Rash     Home Medications  Prior to Admission medications   Medication Sig Start Date End Date Taking? Authorizing Provider  acetaminophen (TYLENOL) 500 MG tablet Take 500 mg by mouth every 6 (six) hours as needed for mild pain.   Yes [provider]  amiodarone (PACERONE) 200 MG tablet Take 200 mg by mouth daily.   Yes [provider]  apixaban (ELIQUIS) 5 MG TABS tablet Take 5 mg by mouth 2 (two) times daily.   Yes [provider]  estradiol (ESTRACE) 0.5 MG tablet Take 0.5 mg by mouth at bedtime.   Yes [provider]  fludrocortisone (FLORINEF) 0.1 MG tablet Take 0.1 mg by mouth daily.   Yes [provider]  levothyroxine (SYNTHROID) 50 MCG tablet  Take 50 mcg by mouth daily before breakfast.   Yes [provider]  venlafaxine XR (EFFEXOR-XR) 150 MG 24 hr capsule Take 150 mg by mouth daily. 01/20/22  Yes [provider]  pregabalin (LYRICA) 50 MG capsule Take 50 mg by mouth 2 (two) times daily. Patient not taking: Reported on 01/16/2023    [provider]     Critical care time: 55 minutes       Venetia Night, AGACNP-BC Acute Care Nurse Practitioner [place]   [phone] / [phone] Please see Amion for pager details.

## 2023-01-18 NOTE — TOC Progression Note (Signed)
Transition of Care [facility]) - Progression Note    Patient Details  Name: Caroline Zamora MRN: [id number] Date of Birth: Nov 20, 1941  [facility]) CM/SW [place], RN Phone Number: 01/18/2023, 9:59 AM  Clinical Narrative:     The patient is scheduled to have a fusion today, TOC will continue to follow and assist with DC planning  Expected Discharge Plan: [place] Barriers to Discharge: Continued Medical Work up  Expected Discharge Plan and Services   Discharge Planning Services: CM Consult   Living arrangements for the past 2 months: Single Family Home                 DME Arranged: N/A                     Social Determinants of Health (SDOH) Interventions SDOH Screenings   Food Insecurity: No Food Insecurity (12/12/2022)  Housing: Low Risk  (12/12/2022)  Transportation Needs: No Transportation Needs (12/12/2022)  Utilities: Not At Risk (12/12/2022)  Tobacco Use: Low Risk  (01/11/2023)    Readmission Risk Interventions     No data to display

## 2023-01-19 ENCOUNTER — Inpatient Hospital Stay: Payer: Medicare HMO | Admitting: Registered Nurse

## 2023-01-19 ENCOUNTER — Inpatient Hospital Stay: Payer: Medicare HMO

## 2023-01-19 ENCOUNTER — Encounter
Admission: EM | Disposition: A | Discharge status: Home Health | Payer: Self-pay | Source: Home / Self Care | Attending: Internal Medicine

## 2023-01-19 ENCOUNTER — Other Ambulatory Visit: Payer: Self-pay

## 2023-01-19 ENCOUNTER — Encounter: Payer: Self-pay | Admitting: Internal Medicine

## 2023-01-19 DIAGNOSIS — W19XXXA Unspecified fall, initial encounter: Secondary | ICD-10-CM | POA: Diagnosis not present

## 2023-01-19 DIAGNOSIS — S22082A Unstable burst fracture of T11-T12 vertebra, initial encounter for closed fracture: Secondary | ICD-10-CM | POA: Diagnosis not present

## 2023-01-19 DIAGNOSIS — M4014 Other secondary kyphosis, thoracic region: Secondary | ICD-10-CM | POA: Diagnosis not present

## 2023-01-19 DIAGNOSIS — M4804 Spinal stenosis, thoracic region: Secondary | ICD-10-CM | POA: Diagnosis not present

## 2023-01-19 DIAGNOSIS — I482 Chronic atrial fibrillation, unspecified: Secondary | ICD-10-CM | POA: Diagnosis not present

## 2023-01-19 DIAGNOSIS — M549 Dorsalgia, unspecified: Secondary | ICD-10-CM | POA: Diagnosis not present

## 2023-01-19 DIAGNOSIS — M532X4 Spinal instabilities, thoracic region: Secondary | ICD-10-CM | POA: Diagnosis not present

## 2023-01-19 LAB — CBC
HCT: 37.1 % (ref 36.0–46.0)
Hemoglobin: 12.1 g/dL (ref 12.0–15.0)
MCH: 30.3 pg (ref 26.0–34.0)
MCHC: 32.6 g/dL (ref 30.0–36.0)
MCV: 92.8 fL (ref 80.0–100.0)
Platelets: 313 10*3/uL (ref 150–400)
RBC: 4 MIL/uL (ref 3.87–5.11)
RDW: 14.1 % (ref 11.5–15.5)
WBC: 7.3 10*3/uL (ref 4.0–10.5)
nRBC: 0 % (ref 0.0–0.2)

## 2023-01-19 LAB — BASIC METABOLIC PANEL
Anion gap: 7 (ref 5–15)
BUN: 27 mg/dL — ABNORMAL HIGH (ref 8–23)
CO2: 24 mmol/L (ref 22–32)
Calcium: 8.4 mg/dL — ABNORMAL LOW (ref 8.9–10.3)
Chloride: 105 mmol/L (ref 98–111)
Creatinine, Ser: 1.04 mg/dL — ABNORMAL HIGH (ref 0.44–1.00)
GFR, Estimated: 55 mL/min — ABNORMAL LOW (ref >60)
Glucose, Bld: 117 mg/dL — ABNORMAL HIGH (ref 70–99)
Potassium: 3.7 mmol/L (ref 3.5–5.1)
Sodium: 136 mmol/L (ref 135–145)

## 2023-01-19 LAB — PHOSPHORUS: Phosphorus: 5 mg/dL — ABNORMAL HIGH (ref 2.5–4.6)

## 2023-01-19 LAB — GLUCOSE, CAPILLARY
Glucose-Capillary: 103 mg/dL — ABNORMAL HIGH (ref 70–99)
Glucose-Capillary: 122 mg/dL — ABNORMAL HIGH (ref 70–99)

## 2023-01-19 LAB — MAGNESIUM: Magnesium: 2.2 mg/dL (ref 1.7–2.4)

## 2023-01-19 SURGERY — POSTERIOR THORACIC FUSION 4 LEVELS
Anesthesia: General

## 2023-01-19 MED ORDER — PROPOFOL 500 MG/50ML IV EMUL
INTRAVENOUS | Status: DC | PRN
  Administered 2023-01-19: 100 ug/kg/min via INTRAVENOUS

## 2023-01-19 MED ORDER — ONDANSETRON HCL 4 MG/2ML IJ SOLN
INTRAMUSCULAR | Status: AC
  Filled 2023-01-19: qty 2

## 2023-01-19 MED ORDER — VANCOMYCIN HCL 1000 MG IV SOLR
INTRAVENOUS | Status: AC
  Filled 2023-01-19: qty 40

## 2023-01-19 MED ORDER — DEXMEDETOMIDINE HCL IN NACL 400 MCG/100ML IV SOLN
0.0000 ug/kg/h | INTRAVENOUS | Status: DC
  Administered 2023-01-19 – 2023-01-20 (×2): 0.4 ug/kg/h via INTRAVENOUS
  Filled 2023-01-19 (×2): qty 100

## 2023-01-19 MED ORDER — CEFAZOLIN SODIUM 1 G IJ SOLR
INTRAMUSCULAR | Status: AC
  Filled 2023-01-19: qty 10

## 2023-01-19 MED ORDER — SODIUM CHLORIDE 0.9 % IV SOLN: INTRAVENOUS | Status: DC

## 2023-01-19 MED ORDER — REMIFENTANIL HCL 1 MG IV SOLR
INTRAVENOUS | Status: AC
  Filled 2023-01-19: qty 1000

## 2023-01-19 MED ORDER — FENTANYL CITRATE PF 50 MCG/ML IJ SOSY: 50.0000 ug | PREFILLED_SYRINGE | INTRAMUSCULAR | Status: DC | PRN

## 2023-01-19 MED ORDER — CEFAZOLIN SODIUM-DEXTROSE 2-3 GM-%(50ML) IV SOLR
INTRAVENOUS | Status: DC | PRN
  Administered 2023-01-19: 2 g via INTRAVENOUS

## 2023-01-19 MED ORDER — EPINEPHRINE PF 1 MG/ML IJ SOLN
INTRAMUSCULAR | Status: AC
  Filled 2023-01-19: qty 1

## 2023-01-19 MED ORDER — DIAZEPAM 5 MG/ML IJ SOLN
INTRAMUSCULAR | Status: AC
  Filled 2023-01-19: qty 2

## 2023-01-19 MED ORDER — REMIFENTANIL HCL 1 MG IV SOLR
INTRAVENOUS | Status: DC | PRN
  Administered 2023-01-19: .12 ug/kg/min via INTRAVENOUS

## 2023-01-19 MED ORDER — BUPIVACAINE LIPOSOME 1.3 % IJ SUSP
INTRAMUSCULAR | Status: AC
  Filled 2023-01-19: qty 20

## 2023-01-19 MED ORDER — VANCOMYCIN HCL 1000 MG IV SOLR
INTRAVENOUS | Status: DC | PRN
  Administered 2023-01-19: 1000 mg

## 2023-01-19 MED ORDER — PROPOFOL 10 MG/ML IV BOLUS
INTRAVENOUS | Status: AC
  Filled 2023-01-19: qty 20

## 2023-01-19 MED ORDER — BUPIVACAINE HCL 0.5 % IJ SOLN
INTRAMUSCULAR | Status: DC | PRN
  Administered 2023-01-19: 10 mL

## 2023-01-19 MED ORDER — MAGNESIUM SULFATE 2 GM/50ML IV SOLN
2.0000 g | Freq: Once | INTRAVENOUS | Status: AC
  Administered 2023-01-19: 2 g via INTRAVENOUS
  Filled 2023-01-19: qty 50

## 2023-01-19 MED ORDER — VANCOMYCIN HCL 1000 MG IV SOLR
INTRAVENOUS | Status: DC | PRN
  Administered 2023-01-19: 1500 mg via INTRAVENOUS

## 2023-01-19 MED ORDER — LIDOCAINE HCL (PF) 2 % IJ SOLN
INTRAMUSCULAR | Status: AC
  Filled 2023-01-19: qty 5

## 2023-01-19 MED ORDER — REMIFENTANIL HCL 1 MG IV SOLR: INTRAVENOUS | Status: DC | PRN

## 2023-01-19 MED ORDER — LACTATED RINGERS IV SOLN: INTRAVENOUS | Status: DC | PRN

## 2023-01-19 MED ORDER — SUCCINYLCHOLINE CHLORIDE 200 MG/10ML IV SOSY
PREFILLED_SYRINGE | INTRAVENOUS | Status: DC | PRN
  Administered 2023-01-19: 100 mg via INTRAVENOUS

## 2023-01-19 MED ORDER — 0.9 % SODIUM CHLORIDE (POUR BTL) OPTIME
TOPICAL | Status: DC | PRN
  Administered 2023-01-19 (×2): 500 mL

## 2023-01-19 MED ORDER — SODIUM CHLORIDE FLUSH 0.9 % IV SOLN
INTRAVENOUS | Status: AC
  Filled 2023-01-19: qty 20

## 2023-01-19 MED ORDER — DEXAMETHASONE SODIUM PHOSPHATE 10 MG/ML IJ SOLN
INTRAMUSCULAR | Status: AC
  Filled 2023-01-19: qty 1

## 2023-01-19 MED ORDER — SODIUM CHLORIDE (PF) 0.9 % IJ SOLN
INTRAMUSCULAR | Status: DC | PRN
  Administered 2023-01-19: 60 mL

## 2023-01-19 MED ORDER — DIAZEPAM 5 MG/ML IJ SOLN
5.0000 mg | Freq: Once | INTRAMUSCULAR | Status: AC
  Administered 2023-01-19: 5 mg via INTRAVENOUS

## 2023-01-19 MED ORDER — IRRISEPT - 450ML BOTTLE WITH 0.05% CHG IN STERILE WATER, USP 99.95% OPTIME
TOPICAL | Status: DC | PRN
  Administered 2023-01-19: 450 mL

## 2023-01-19 MED ORDER — DIAZEPAM 5 MG/ML IJ SOLN
5.0000 mg | Freq: Once | INTRAMUSCULAR | Status: AC
  Administered 2023-01-19: 5 mg via INTRAVENOUS
  Filled 2023-01-19: qty 2

## 2023-01-19 MED ORDER — SODIUM CHLORIDE 0.9% FLUSH: 3.0000 mL | INTRAVENOUS | Status: DC | PRN

## 2023-01-19 MED ORDER — LIDOCAINE HCL (CARDIAC) PF 100 MG/5ML IV SOSY
PREFILLED_SYRINGE | INTRAVENOUS | Status: DC | PRN
  Administered 2023-01-19: 100 mg via INTRAVENOUS

## 2023-01-19 MED ORDER — FENTANYL CITRATE (PF) 100 MCG/2ML IJ SOLN
INTRAMUSCULAR | Status: DC | PRN
  Administered 2023-01-19 (×2): 50 ug via INTRAVENOUS

## 2023-01-19 MED ORDER — MIDAZOLAM HCL 2 MG/2ML IJ SOLN
INTRAMUSCULAR | Status: AC
  Filled 2023-01-19: qty 2

## 2023-01-19 MED ORDER — PHENYLEPHRINE HCL-NACL 20-0.9 MG/250ML-% IV SOLN
INTRAVENOUS | Status: AC
  Filled 2023-01-19: qty 250

## 2023-01-19 MED ORDER — VANCOMYCIN HCL 1000 MG IV SOLR
INTRAVENOUS | Status: AC
  Filled 2023-01-19: qty 20

## 2023-01-19 MED ORDER — SODIUM CHLORIDE 0.9 % IV SOLN: 250.0000 mL | INTRAVENOUS | Status: DC

## 2023-01-19 MED ORDER — PHENYLEPHRINE 80 MCG/ML (10ML) SYRINGE FOR IV PUSH (FOR BLOOD PRESSURE SUPPORT)
PREFILLED_SYRINGE | INTRAVENOUS | Status: AC
  Filled 2023-01-19: qty 10

## 2023-01-19 MED ORDER — PROPOFOL 1000 MG/100ML IV EMUL
INTRAVENOUS | Status: AC
  Filled 2023-01-19: qty 100

## 2023-01-19 MED ORDER — SODIUM CHLORIDE 0.9% FLUSH
3.0000 mL | Freq: Two times a day (BID) | INTRAVENOUS | Status: DC
  Administered 2023-01-19 – 2023-01-25 (×10): 3 mL via INTRAVENOUS

## 2023-01-19 MED ORDER — OLANZAPINE 10 MG IM SOLR
5.0000 mg | Freq: Once | INTRAMUSCULAR | Status: AC | PRN
  Administered 2023-01-19: 5 mg via INTRAMUSCULAR
  Filled 2023-01-19: qty 10

## 2023-01-19 MED ORDER — SODIUM CHLORIDE 0.9 % IV SOLN: INTRAVENOUS | Status: DC | PRN

## 2023-01-19 MED ORDER — DEXAMETHASONE SODIUM PHOSPHATE 10 MG/ML IJ SOLN
INTRAMUSCULAR | Status: DC | PRN
  Administered 2023-01-19: 10 mg via INTRAVENOUS

## 2023-01-19 MED ORDER — PHENYLEPHRINE HCL-NACL 20-0.9 MG/250ML-% IV SOLN
INTRAVENOUS | Status: DC | PRN
  Administered 2023-01-19: 35 ug/min via INTRAVENOUS

## 2023-01-19 MED ORDER — MENTHOL 3 MG MT LOZG: 1.0000 | LOZENGE | OROMUCOSAL | Status: DC | PRN

## 2023-01-19 MED ORDER — FENTANYL CITRATE (PF) 100 MCG/2ML IJ SOLN: 25.0000 ug | INTRAMUSCULAR | Status: DC | PRN

## 2023-01-19 MED ORDER — BUPIVACAINE HCL (PF) 0.5 % IJ SOLN
INTRAMUSCULAR | Status: AC
  Filled 2023-01-19: qty 60

## 2023-01-19 MED ORDER — PHENOL 1.4 % MT LIQD: 1.0000 | OROMUCOSAL | Status: DC | PRN

## 2023-01-19 MED ORDER — SUCCINYLCHOLINE CHLORIDE 200 MG/10ML IV SOSY: PREFILLED_SYRINGE | INTRAVENOUS | Status: DC | PRN

## 2023-01-19 MED ORDER — FENTANYL CITRATE (PF) 100 MCG/2ML IJ SOLN
INTRAMUSCULAR | Status: AC
  Filled 2023-01-19: qty 2

## 2023-01-19 MED ORDER — PHENYLEPHRINE HCL (PRESSORS) 10 MG/ML IV SOLN
INTRAVENOUS | Status: DC | PRN
  Administered 2023-01-19 (×2): 120 ug via INTRAVENOUS

## 2023-01-19 MED ORDER — PROPOFOL 10 MG/ML IV BOLUS
INTRAVENOUS | Status: DC | PRN
  Administered 2023-01-19 (×2): 30 mg via INTRAVENOUS
  Administered 2023-01-19: 100 mg via INTRAVENOUS
  Administered 2023-01-19: 30 mg via INTRAVENOUS

## 2023-01-19 MED ORDER — ONDANSETRON HCL 4 MG/2ML IJ SOLN
INTRAMUSCULAR | Status: DC | PRN
  Administered 2023-01-19: 4 mg via INTRAVENOUS

## 2023-01-19 SURGICAL SUPPLY — 65 items
ALLOGRAFT BONESTRIP KORE 2.5X5 (Bone Implant) IMPLANT
ALLOGRAFT BONESTRP KORE 2.5X10 (Bone Implant) IMPLANT
BASIN KIT SINGLE STR (MISCELLANEOUS) ×1 IMPLANT
BUR NEURO DRILL SOFT 3.0X3.8M (BURR) ×1 IMPLANT
CANISTER WOUND CARE 500ML ATS (WOUND CARE) IMPLANT
CNTNR URN SCR LID CUP LEK RST (MISCELLANEOUS) ×1 IMPLANT
CONT SPEC 4OZ STRL OR WHT (MISCELLANEOUS) ×2
COVERAGE SUPP BRAINLAB NG SPNE (MISCELLANEOUS) ×1 IMPLANT
COVERAGE SUPPORT SPINE BRAINLB (MISCELLANEOUS) ×1
CUP MEDICINE 2OZ PLAST GRAD ST (MISCELLANEOUS) ×1 IMPLANT
DRAPE 3D C-ARM OEC (DRAPES) IMPLANT
DRAPE C ARM PK CFD 31 SPINE (DRAPES) ×1 IMPLANT
DRAPE LAPAROTOMY 100X77 ABD (DRAPES) ×1 IMPLANT
DRAPE MICROSCOPE SPINE 48X150 (DRAPES) ×1 IMPLANT
DRAPE SCAN PATIENT (DRAPES) IMPLANT
DRESSING PREVENA PLUS CUSTOM (GAUZE/BANDAGES/DRESSINGS) IMPLANT
DRSG PREVENA PLUS CUSTOM (GAUZE/BANDAGES/DRESSINGS) ×1
ELECT CAUTERY BLADE TIP 2.5 (TIP) ×2
ELECT EZSTD 165MM 6.5IN (MISCELLANEOUS)
ELECT REM PT RETURN 9FT ADLT (ELECTROSURGICAL) ×1
ELECTRODE CAUTERY BLDE TIP 2.5 (TIP) ×2 IMPLANT
ELECTRODE EZSTD 165MM 6.5IN (MISCELLANEOUS) IMPLANT
ELECTRODE REM PT RTRN 9FT ADLT (ELECTROSURGICAL) ×1 IMPLANT
FEE CVG SUPP BRAINLAB NG SPNE (MISCELLANEOUS) IMPLANT
GLOVE BIOGEL PI IND STRL 8.5 (GLOVE) ×1 IMPLANT
GLOVE SURG SYN 6.5 ES PF (GLOVE) ×2 IMPLANT
GLOVE SURG SYN 6.5 PF PI (GLOVE) ×2 IMPLANT
GLOVE SURG SYN 8.5  E (GLOVE) ×3
GLOVE SURG SYN 8.5 E (GLOVE) ×3 IMPLANT
GLOVE SURG SYN 8.5 PF PI (GLOVE) ×3 IMPLANT
GLOVE SURG UNDER POLY LF SZ6.5 (GLOVE) ×1 IMPLANT
GOWN SRG LRG LVL 4 IMPRV REINF (GOWNS) ×1 IMPLANT
GOWN SRG XL LVL 3 NONREINFORCE (GOWNS) ×1 IMPLANT
GOWN STRL NON-REIN TWL XL LVL3 (GOWNS) ×1
GOWN STRL REIN LRG LVL4 (GOWNS) ×1
GRAFT DURAGEN MATRIX 1WX1L (Tissue) IMPLANT
HEMOVAC 400CC 10FR (MISCELLANEOUS) IMPLANT
KIT SPINAL PRONEVIEW (KITS) ×1 IMPLANT
MANIFOLD NEPTUNE II (INSTRUMENTS) ×1 IMPLANT
MARKER SKIN DUAL TIP RULER LAB (MISCELLANEOUS) ×1 IMPLANT
MARKER SPHERE PSV REFLC 13MM (MARKER) IMPLANT
NDL SAFETY ECLIP 18X1.5 (MISCELLANEOUS) ×1 IMPLANT
NS IRRIG 1000ML POUR BTL (IV SOLUTION) ×1 IMPLANT
PACK LAMINECTOMY NEURO (CUSTOM PROCEDURE TRAY) ×1 IMPLANT
PAD ARMBOARD 7.5X6 YLW CONV (MISCELLANEOUS) ×1 IMPLANT
PREVENA INCISION MGT 90 150 (MISCELLANEOUS) IMPLANT
ROD RELINE-O 5.5X300 STRT NS (Rod) IMPLANT
ROD RELINE-O 5.5X300MM STRT (Rod) ×1 IMPLANT
SCREW LOCK RELINE 5.5 TULIP (Screw) IMPLANT
SCREW RELINE PA 2FC 6.5X50 (Screw) IMPLANT
SCREW RELINE PA 6.5X45 (Screw) IMPLANT
SCREW RELINE PA 6.5X55 (Screw) IMPLANT
SOLUTION IRRIG SURGIPHOR (IV SOLUTION) ×1 IMPLANT
SPONGE GAUZE 2X2 8PLY STRL LF (GAUZE/BANDAGES/DRESSINGS) IMPLANT
SURGIFLO W/THROMBIN 8M KIT (HEMOSTASIS) ×1 IMPLANT
SUT DVC VLOC 3-0 CL 6 P-12 (SUTURE) ×1 IMPLANT
SUT ETHILON 2 0 FS 18 (SUTURE) IMPLANT
SUT VIC AB 0 CT1 27 (SUTURE) ×3
SUT VIC AB 0 CT1 27XCR 8 STRN (SUTURE) ×2 IMPLANT
SUT VIC AB 2-0 CT1 18 (SUTURE) ×2 IMPLANT
SYR 10ML LL (SYRINGE) ×1 IMPLANT
SYR 30ML LL (SYRINGE) ×2 IMPLANT
TOWEL OR 17X26 4PK STRL BLUE (TOWEL DISPOSABLE) ×2 IMPLANT
TRAP FLUID SMOKE EVACUATOR (MISCELLANEOUS) ×1 IMPLANT
WATER STERILE IRR 1000ML POUR (IV SOLUTION) ×2 IMPLANT

## 2023-01-19 NOTE — TOC Progression Note (Signed)
Transition of Care [facility]) - Progression Note    Patient Details  Name: Caroline Zamora MRN: [id number] Date of Birth: 1941-05-17  [facility]) CM/SW Contact  Shelbie Hutching, RN Phone Number: 01/19/2023, 2:10 PM  Clinical Narrative:    Patient in the OR for surgery for T 12 burst fx.  TOC will cont to follow and assist with discharge planning.    Expected Discharge Plan: [place] Barriers to Discharge: Continued Medical Work up  Expected Discharge Plan and Services   Discharge Planning Services: CM Consult   Living arrangements for the past 2 months: Single Family Home                 DME Arranged: N/A                     Social Determinants of Health (SDOH) Interventions SDOH Screenings   Food Insecurity: No Food Insecurity (12/12/2022)  Housing: Low Risk  (12/12/2022)  Transportation Needs: No Transportation Needs (12/12/2022)  Utilities: Not At Risk (12/12/2022)  Tobacco Use: Low Risk  (01/19/2023)    Readmission Risk Interventions     No data to display

## 2023-01-19 NOTE — Progress Notes (Signed)
Patient noted with severe agitation, difficulty focusing, Hallucination, Slurred with intermittent pressured speech, restlessness, and  aggressive behavior. She was very difficulty to re-orient or redirect requiring multiple staff to intervene. Patient received multiple doses of Valium with no effect. Antipsychotic held due to concerns for Qtc prolongation. Precedex ordered and PCCM consulted.   Post Anesthesia Delirium/Psychosis -Zyprexa 50m IM administered given very little no effect on Qtc prolongation. Patient noted with significant improvement post Zyprexa administration.   -Precedex not initiated at this time. No further critical care needs at this time, patient hemodynamically stable, protecting her airway and calm not requiring any interventions. PCCM will continue to monitor peripherally if needed.     ERufina Falco DNP, CCRN, FNP-C, AGACNP-BC Acute Care & Family Nurse Practitioner  [facility]  See Amion for personal pager PCCM on call pager ([phone] 7 am

## 2023-01-19 NOTE — Progress Notes (Signed)
Pt transported to OR for surgery.

## 2023-01-19 NOTE — Progress Notes (Signed)
       CROSS COVER NOTE  NAME: ITALIE LEJEUNE MRN: [id number] DOB : 10-16-1941    HPI/Events of Note   Patient post neurosurgical intervention for unstable T12 fracture today. History of anxiety, depression, chf, CKD, colon cancer (s/p surgery) and PAF experiencing severe psychosis  Assessment and  Interventions   Assessment: Agitation, confused, hallucinating, combative 5 mg of IV valium x 3 with only minimal improvement in sxs for short time, then required bilateral soft wrist restraints for patient and staff safety Qtc 548 on prior EKG; Mag 2.0, K 3.7 Discussed with Dr Damita Dunnings recommends CCM consult and precedex Plan: CCM consult - discussed with Rufina Falco NP       Kathlene Cote NP [facility]

## 2023-01-19 NOTE — Progress Notes (Signed)
Valium seems to work to help calm patient down for about 5-10 minutes.  Patient began to chew on safety mitts, and chewed IV out of her left hand. Patient swinging hands and kicking feet at staff and continues to be verbally aggressive toward staff.   Hassan Rowan, NP notified and requested medical help with patient. 5MG valium order again. 3 doses thus far.   Patient continues to be verbally and physically aggressive toward staff.

## 2023-01-19 NOTE — Anesthesia Postprocedure Evaluation (Signed)
Anesthesia Post Note  Patient: Caroline Zamora  Procedure(s) Performed: POSTERIOR THORACIC FUSION 4 LEVELS T10-L2 open  Patient location during evaluation: PACU Anesthesia Type: General Level of consciousness: awake and alert, oriented and patient cooperative Pain management: pain level controlled Vital Signs Assessment: post-procedure vital signs reviewed and stable Respiratory status: spontaneous breathing, nonlabored ventilation and respiratory function stable Cardiovascular status: blood pressure returned to baseline and stable Postop Assessment: adequate PO intake Anesthetic complications: no   No notable events documented.   Last Vitals:  Vitals:   01/19/23 1641 01/19/23 1700  BP: (!) 147/126 (!) 129/92  Pulse: 96 82  Resp: (!) 22 11  Temp: 36.4 C   SpO2: 99% 98%    Last Pain:  Vitals:   01/19/23 1615  TempSrc:   PainSc: 0-No pain                 Darrin Nipper

## 2023-01-19 NOTE — Op Note (Signed)
Indications: Ms. Caroline Zamora is a 82 year old female who suffered a T12 burst fracture after a fall last month.  She suffered worsening kyphosis of the thoracic spine due to this fracture with inherent instability.  The worsening the fracture also caused severe thoracic stenosis.  Due to thoracic instability and progressive thoracic kyphosis, surgical intervention was recommended.  Findings: Open reduction internal fixation of burst fracture  Preoperative Diagnosis: Unstable T12 burst fracture, thoracic stenosis, thoracic kyphosis, thoracic instability  postoperative Diagnosis: same   EBL: 250 ml IVF: see AR ml Drains: 1 placed Disposition: Extubated and Stable to PACU Complications: none  Preoperative Note:   Risks of surgery discussed include: infection, bleeding, stroke, coma, death, paralysis, CSF leak, nerve/spinal cord injury, numbness, tingling, weakness, complex regional pain syndrome, recurrent stenosis and/or disc herniation, vascular injury, development of instability, neck/back pain, need for further surgery, persistent symptoms, development of deformity, and the risks of anesthesia. The patient understood these risks and agreed to proceed. Informed consent was given on Jan 17, 2023.  Operative Note:  1. Open Reduction and Internal Fixation T12 fracture 2. Posterolateral arthrodesis T10 to L2 3. Posterior segmental instrumentation T10 to L2 using Nuvasive Reline 4. Thoracic transpedicular decompression with full T12 and partial T11 laminectomy 5. Harvesting of autograft via the same incision 6. Use of stereotaxis    The patient was brought to the Operating Room, intubated and turned into the prone position. All pressure points were checked and double checked. Flouroscopy was used to mark the incision. The patient was prepped and draped in the standard fashion. A full timeout was performed. Preoperative antibiotics were given. The incision was injected with local anesthetic.  The  incision was opened with a scalpel, then the soft tissues divided with the Bovie. Self-retaining retractors were placed. The paraspinus muscles were reflected laterally in subperiosteal fashion until the transverse processes were visible. Flouroscopy was used to confirm our localization.  The [facility] stereotactic array was placed.  The C arm was then used to acquire stereotactic images which were registered to the patient.  We utilize stereotactic guidance for placement of all pedicle screws.  Using guided drill guides, the pedicles at T10, T11, L1, and L2 were cannulated bilaterally.  These were checked with the multi probe to confirm lack of breach.  NuVasive reline pedicle screws were then placed at each level bilaterally.  6.5 x 45 mm screws were placed at T11 and T12.  6.5 x 50 mm screws were placed at L1.  6.5 x 55 mm screws were placed at L2.  After placement of pedicle screws, the rod was measured and shaped.  On the left side, the rod was placed.  The locking caps were secured and final tightened at L1 and L2.  The T11 screw was then distracted superiorly and locked into position.  The T10 locking cap was also locked into position.  By reducing the screws to the rod, the T12 fracture was openly reduced and internally fixated.  At this point, the course the rib cutter was used to remove the spinous processes of T11 and T12.  The high-speed drill was utilized to drill a trough laminectomy at T12 and to remove the inferior half of the T11 lamina.  After identifying the epidural fat and the dura, the high-speed drill was used to remove the inferior articulating process of both the T11 and T12 on the right.  The high-speed drill was then used to drill through the right T12 pedicle until the junction with the vertebral  body was identified and confirmed using stereotactic guidance.  Using curettes and Kerrison punches, the remainder of the T12 pedicle, superior articulating process, and inferior  articulating processes were removed.  While protecting the dura on the right side, the vertebral body was entered to allow for anterior impaction of the retropulsed fracture fragment.  The epidural plane was identified superior and inferior to the retropulsed fragment.  This was carefully dissected until we were able to impact the fracture fragment towards the T12 vertebral body.  At this point, the spinal cord was completely decompressed.  The C arm was brought back into the field and stereotactic images required to confirm adequate placement of all pedicle screws.  After this was confirmed, the right-sided rod was then shaped and locked into position.  The wound was copiously irrigated, then the external surfaces of the remaining lamina, facet, and transverse processes from T10 to L2 were decorticated. A mixture of allograft and autograft was placed over the decorticated surfaces for arthrodesis.  A drain was placed subfascially.   After hemostasis, the wound was closed in layers with 0 and 2-0 vicryl. 3-0 monocryl and a wound vac  were applied to the incision.  The patient was then flipped supine and extubated with incident. All counts were correct times 2 at the end of the case. No immediate complications were noted.  Cooper Render PA assisted in the entire procedure. An assistant was required for this procedure due to the complexity.  The assistant provided assistance in tissue manipulation and suction, and was required for the successful and safe performance of the procedure. I performed the critical portions of the procedure.   Meade Maw MD

## 2023-01-19 NOTE — Transfer of Care (Signed)
Immediate Anesthesia Transfer of Care Note  Patient: Caroline Zamora  Procedure(s) Performed: POSTERIOR THORACIC FUSION 4 LEVELS T10-L2 open  Patient Location: PACU  Anesthesia Type:General  Level of Consciousness: drowsy  Airway & Oxygen Therapy: Patient Spontanous Breathing and Patient connected to nasal cannula oxygen  Post-op Assessment: Report given to RN and Post -op Vital signs reviewed and stable  Post vital signs: Reviewed and stable  Last Vitals:  Vitals Value Taken Time  BP 146/69 01/19/23 1550  Temp 36.3 C 01/19/23 1550  Pulse 80 01/19/23 1559  Resp 19 01/19/23 1559  SpO2 98 % 01/19/23 1559  Vitals shown include unvalidated device data.  Last Pain:  Vitals:   01/19/23 1550  TempSrc:   PainSc: Asleep      Patients Stated Pain Goal: 0 ([phone] [id number])  Complications: No notable events documented.

## 2023-01-19 NOTE — Interval H&P Note (Signed)
History and Physical Interval Note:  01/19/2023 10:08 AM  Caroline Zamora  has presented today for surgery, with the diagnosis of thoracic instability, unstable burst fracture, thoracic stenosis.  The various methods of treatment have been discussed with the patient and family. After consideration of risks, benefits and other options for treatment, the patient has consented to  Procedure(s) with comments: POSTERIOR THORACIC FUSION 4 LEVELS T10-L2 open (N/A) - T10-L2 posterior fusion as a surgical intervention.  The patient's history has been reviewed, patient examined, no change in status, stable for surgery.  I have reviewed the patient's chart and labs.  Questions were answered to the patient's satisfaction.    Heart sounds normal no MRG. Chest Clear to Auscultation Bilaterally.    Caroline Zamora

## 2023-01-19 NOTE — Anesthesia Preprocedure Evaluation (Addendum)
Anesthesia Evaluation  Patient identified by MRN, date of birth, ID band Patient awake    Reviewed: Allergy & Precautions, H&P , NPO status , Patient's Chart, lab work & pertinent test results, reviewed documented beta blocker date and time   History of Anesthesia Complications Negative for: history of anesthetic complications  Airway Mallampati: II   Neck ROM: full    Dental  (+) Poor Dentition, Dental Advidsory Given   Pulmonary neg pulmonary ROS   Pulmonary exam normal        Cardiovascular Exercise Tolerance: Poor hypertension, On Medications (-) angina +CHF  (-) Past MI and (-) Cardiac Stents + dysrhythmias Atrial Fibrillation (-) Valvular Problems/Murmurs Rhythm:regular Rate:Normal     Neuro/Psych  Headaches, neg Seizures PSYCHIATRIC DISORDERS (acute delirium) Anxiety Depression       GI/Hepatic negative GI ROS, Neg liver ROS,,,  Endo/Other  diabetes, Well ControlledHypothyroidism    Renal/GU Renal disease  negative genitourinary   Musculoskeletal   Abdominal   Peds  Hematology negative hematology ROS (+)   Anesthesia Other Findings Past Medical History: No date: AF (atrial fibrillation) (HCC) No date: Arthritis     Comment:  lumbar spine No date: Chronic kidney disease No date: Colon cancer ([place]) No date: Depression No date: Dysphagia No date: Headache     Comment:  migraine No date: Hx of adenomatous colonic polyps No date: Hyperlipidemia No date: Hypertension No date: Paroxysmal atrial fibrillation (HCC) No date: Skin cancer Past Surgical History: No date: ABDOMINAL HYSTERECTOMY     Comment:  with removal of tubes and ovaries No date: COLONOSCOPY 09/03/2017: COLONOSCOPY WITH PROPOFOL; N/A     Comment:  Procedure: COLONOSCOPY WITH PROPOFOL;  Surgeon: Manya Silvas, MD;  Location: [facility] ENDOSCOPY;  Service:               Endoscopy;  Laterality: N/A; No date: DILATION AND  CURETTAGE OF UTERUS No date: ESOPHAGOGASTRODUODENOSCOPY No date: skin cancer removal No date: TONSILLECTOMY No date: TUBAL LIGATION BMI    Body Mass Index: 28.25 kg/m     Reproductive/Obstetrics negative OB ROS                             Anesthesia Physical Anesthesia Plan  ASA: 3  Anesthesia Plan: General   Post-op Pain Management:    Induction: Intravenous  PONV Risk Score and Plan: 3 and Ondansetron, Dexamethasone and Treatment may vary due to age or medical condition  Airway Management Planned: Oral ETT  Additional Equipment:   Intra-op Plan:   Post-operative Plan: Extubation in OR  Informed Consent: I have reviewed the patients History and Physical, chart, labs and discussed the procedure including the risks, benefits and alternatives for the proposed anesthesia with the patient or authorized representative who has indicated his/her understanding and acceptance.     Dental Advisory Given  Plan Discussed with: CRNA  Anesthesia Plan Comments:         Anesthesia Quick Evaluation

## 2023-01-19 NOTE — Plan of Care (Signed)
Consult noted for goals of care.  Patient currently is in the OR.  PMT will reattempt next week on return to service.

## 2023-01-19 NOTE — Progress Notes (Signed)
  Progress Note   Patient: Caroline Zamora [id number] DOB: 1941-01-02 DOA: 01/16/2023     3 DOS: the patient was seen and examined on 01/19/2023   Brief hospital course: 82 y.o. female with medical history significant of hypertension, hyperlipidemia, diastolic CHF, hypothyroidism, depression with anxiety, CKD 3A, PAF on Eliquis, colon cancer (s/p of surgery), orthostatic hypotension, who presents with frequent fall and back pain and was found to have T12 burst fracture  2/13: Neurosurgery consult 2/14: Worsening of T12 burst fracture which is unstable and now requiring surgery which is scheduled for Friday by neurosurgery 2/15: Transfer to stepdown and intensivist consult.  Dilaudid pump started, Lyrica 25 mg 3 times daily and 1 dose of Decadron 2/16: T10-L2 posterior fusion  Assessment and Plan:  Intractable back pain due to closed fracture of T12 vertebra: MRI showed subacute T12 body fracture with advanced height loss that is progressed from 12/11/2022 CT. Also progressed is retropulsion which causes lower cord compression.  Repeat x-ray on 2/14 showed worsening of T12 burst fracture with evidence of instability with her thoracic kyphosis now measuring 30 degree versus 18 degree on initial CT scan performed last month Appreciate neurosurgery input.  Plan for surgery today -Monitor in stepdown for now   Fall at home, initial encounter -Fall precaution -PT/OT once cleared by neurosurgeon   Atrial fibrillation, chronic ([place]) -Holding Eliquis for scheduled surgery today -Continue amiodarone for rate control   HTN (hypertension): Patient's not taking medications.  -IV hydralazine as needed   Chronic kidney disease, stage 3a ([place]): Stable -Monitor BMP   Hypokalemia and hypophosphatemia: Repleted and resolved   Chronic diastolic CHF (congestive heart failure) ([place]): 2D echo 12/12/2022 showed EF of 55-60% with grade 1 diastolic dysfunction.  Patient does not have leg edema or JVD.   BNP normal 30.6.  CHF is compensated. -Watch volume status closely   Depression with anxiety -Continue home medications   Hypothyroidism: -Continue Synthroid   Orthostatic hypotension: -Florinef stopped per her outpatient cardiology Dr. Virl Axe request.  He sent me a secure chat yesterday so I have stopped it.      Subjective: She was more awake and alert this morning   Physical Exam: Vitals:   01/19/23 1000 01/19/23 1007 01/19/23 1037 01/19/23 1100  BP: (!) 238/220 (!) 237/178 125/82 (!) 113/52  Pulse: 80 77 84 80  Resp: (!) 21 18 17 18  $ Temp:      TempSrc:      SpO2: 96% 95% 95% 96%  Weight:      Height:       General: 82 year old female looking uncomfortable but somewhat better than yesterday Cardiac: S1/S2, RRR, No murmurs, No gallops or rubs. Respiratory: No rales, wheezing, rhonchi or rubs. GI: Soft, benign Ext: No pitting leg edema bilaterally. 1+DP/PT pulse bilaterally. Musculoskeletal: has tenderness in lower and mid back Skin: No rashes.  Neuro: Alert, oriented X3, nonfocal Psych: Seems less anxious Data Reviewed:  There are no new results to review at this time.  Family Communication: None today  Disposition: Status is: Inpatient Remains inpatient appropriate because: Getting spinal fusion  Planned Discharge Destination: Skilled nursing facility   DVT prophylaxis-SCDs Time spent: 35 minutes  Author: Max Sane, MD 01/19/2023 2:31 PM  For on call review [email or web address].

## 2023-01-19 NOTE — Anesthesia Procedure Notes (Addendum)
Procedure Name: Intubation Date/Time: 01/19/2023 11:49 AM  Performed by: Daevon Holdren, [place], CRNAPre-anesthesia Checklist: Patient identified, Patient being monitored, Timeout performed, Emergency Drugs available and Suction available Patient Re-evaluated:Patient Re-evaluated prior to induction Oxygen Delivery Method: Circle system utilized Preoxygenation: Pre-oxygenation with 100% oxygen Induction Type: IV induction Ventilation: Mask ventilation without difficulty Laryngoscope Size: 3 and McGraph Grade View: Grade I Tube type: Oral Tube size: 7.0 mm Number of attempts: 1 Airway Equipment and Method: Stylet Placement Confirmation: ETT inserted through vocal cords under direct vision, positive ETCO2 and breath sounds checked- equal and bilateral Secured at: 22 cm Tube secured with: Tape Dental Injury: Teeth and Oropharynx as per pre-operative assessment

## 2023-01-19 NOTE — Progress Notes (Addendum)
       CROSS COVER NOTE  NAME: Caroline Zamora MRN: [id number] DOB : 06-09-1941    HPI/Events of Note   Patient on PCA with intermittent confusion interfering with pca utilization appropriately   Assessment and  Interventions   Assessment:  Plan: D/C PCA      Sharion Settler NP [facility]

## 2023-01-19 NOTE — Progress Notes (Signed)
Pt returned from surgery, very drowsy and sleepy. Pt made comfortable in bed. Noted mid back incision closure. " the wound was closed in layers with 0 and 2-0 vicryl. 3-0 monocryl and a wound vac in place. Noted also was a jackson pratt wound drainage reservoir filled with bright red blood drainage, empted  14ms. BP 14/126, HR 87, Resp 12, sat 98%  on 2L oxygen.

## 2023-01-19 NOTE — Progress Notes (Signed)
Patient became verbally and physically aggressive towards staff. Patient hitting and spitting toward staff and multiple attempts to bite staff. Mitts placed for patient and staff safety. Patient refusing to take oral medications from staff. One time order for Valium, Hassan Rowan, NP. Medication given

## 2023-01-20 ENCOUNTER — Inpatient Hospital Stay: Payer: Medicare HMO

## 2023-01-20 DIAGNOSIS — G9341 Metabolic encephalopathy: Secondary | ICD-10-CM

## 2023-01-20 DIAGNOSIS — S22082S Unstable burst fracture of T11-T12 vertebra, sequela: Secondary | ICD-10-CM

## 2023-01-20 DIAGNOSIS — M549 Dorsalgia, unspecified: Secondary | ICD-10-CM | POA: Diagnosis not present

## 2023-01-20 DIAGNOSIS — M532X4 Spinal instabilities, thoracic region: Secondary | ICD-10-CM | POA: Diagnosis not present

## 2023-01-20 DIAGNOSIS — N1831 Chronic kidney disease, stage 3a: Secondary | ICD-10-CM | POA: Diagnosis not present

## 2023-01-20 LAB — GLUCOSE, CAPILLARY: Glucose-Capillary: 140 mg/dL — ABNORMAL HIGH (ref 70–99)

## 2023-01-20 MED ORDER — LACTATED RINGERS IV BOLUS
500.0000 mL | Freq: Once | INTRAVENOUS | Status: AC
  Administered 2023-01-20: 500 mL via INTRAVENOUS

## 2023-01-20 MED ORDER — QUETIAPINE FUMARATE 25 MG PO TABS
50.0000 mg | ORAL_TABLET | Freq: Two times a day (BID) | ORAL | Status: DC
  Administered 2023-01-21: 50 mg via ORAL
  Filled 2023-01-20: qty 2

## 2023-01-20 MED ORDER — FENTANYL CITRATE PF 50 MCG/ML IJ SOSY: 50.0000 ug | PREFILLED_SYRINGE | INTRAMUSCULAR | Status: DC | PRN

## 2023-01-20 MED ORDER — APIXABAN 5 MG PO TABS
5.0000 mg | ORAL_TABLET | Freq: Two times a day (BID) | ORAL | Status: DC
  Administered 2023-01-20 – 2023-01-21 (×4): 5 mg via ORAL
  Filled 2023-01-20 (×4): qty 1

## 2023-01-20 MED ORDER — QUETIAPINE FUMARATE 25 MG PO TABS
25.0000 mg | ORAL_TABLET | Freq: Two times a day (BID) | ORAL | Status: DC
  Administered 2023-01-20: 25 mg via ORAL
  Filled 2023-01-20: qty 1

## 2023-01-20 MED ORDER — QUETIAPINE FUMARATE 25 MG PO TABS
50.0000 mg | ORAL_TABLET | Freq: Two times a day (BID) | ORAL | Status: DC
  Administered 2023-01-20: 50 mg via ORAL
  Filled 2023-01-20 (×2): qty 2

## 2023-01-20 MED ORDER — QUETIAPINE FUMARATE 25 MG PO TABS
25.0000 mg | ORAL_TABLET | Freq: Once | ORAL | Status: AC
  Administered 2023-01-20: 25 mg via ORAL
  Filled 2023-01-20: qty 1

## 2023-01-20 NOTE — Consult Note (Signed)
Continued agitation overnight and started on minimal precedex gtt.   Checking QTC and starting her on scheduled seroquel what looks to be hyperactive vs. ICU delirium.   Arcelia Jew MD [facility]

## 2023-01-20 NOTE — Progress Notes (Signed)
OT Cancellation Note  Patient Details Name: Caroline Zamora MRN: [id number] DOB: [birth year]   Cancelled Treatment:    Reason Eval/Treat Not Completed: Patient's level of consciousness;Medical issues which prohibited therapy. Per notes and discussion with RN, pt is agitated/hitting/spitting when awake; is currently medicated and now lethargic and not responding.  PT talked with son briefly who stated that pt is typically alert and very pleasant, so she seems to be experiencing some post-surgical delirium. Will hold until pt is able to participate well in therapy.  Vania Rea 01/20/2023, 2:39 PM

## 2023-01-20 NOTE — Evaluation (Signed)
Physical Therapy Evaluation Patient Details Name: Caroline Zamora MRN: [id number] DOB: 11/24/1941 Today's Date: 01/20/2023  History of Present Illness  82 y.o. female with medical history significant of hypertension, hyperlipidemia, diastolic CHF, hypothyroidism, depression with anxiety, CKD 3A, PAF on Eliquis, colon cancer (s/p of surgery), orthostatic hypotension, who presents with frequent fall and back pain and was found to have T12 burst fracture, s/p T10-L2 decompression and fusion 2/16; post-op delirium. PMH: Afib, HTN, CKD, depression, migraines, and arthritis.  Clinical Impression  Pt unable to wake on attempt to see her this AM, able to occasionally open eyes and attempt conversation this afternoon despite continued lethargy.  She was able to follow simple cues for light LE exercises and did show some attempt to roll with cuing but had too much pain and confused resistance to safely continue trying mobility this date.  Pt's son present t/o session and does report that this is the most awake she has been since surgery.  Pt will need continued PT to address functional limitations per spinal fusion protocols.       Recommendations for follow up therapy are one component of a multi-disciplinary discharge planning process, led by the attending physician.  Recommendations may be updated based on patient status, additional functional criteria and insurance authorization.  Follow Up Recommendations Skilled nursing-short term rehab (<3 hours/day) Can patient physically be transported by private vehicle: No    Assistance Recommended at Discharge Frequent or constant Supervision/Assistance  Patient can return home with the following  A lot of help with walking and/or transfers;A lot of help with bathing/dressing/bathroom;Assistance with cooking/housework;Help with stairs or ramp for entrance;Assist for transportation    Equipment Recommendations  (TBD per progress)  Recommendations for Other  Services       Functional Status Assessment Patient has had a recent decline in their functional status and demonstrates the ability to make significant improvements in function in a reasonable and predictable amount of time.     Precautions / Restrictions Precautions Precautions: Back Required Braces or Orthoses: Spinal Brace Spinal Brace: Thoracolumbosacral orthotic;Applied in sitting position Restrictions Weight Bearing Restrictions: No      Mobility  Bed Mobility Overal bed mobility: Needs Assistance Bed Mobility: Rolling Rolling: Total assist         General bed mobility comments: Only able to initiate roll as she c/o severe pain with 2 attempts at log roll to R    Transfers                   General transfer comment: inappropriate to attempt this date    Ambulation/Gait               General Gait Details: inappropriate to attempt this date  Stairs            Wheelchair Mobility    Modified Rankin (Stroke Patients Only)       Balance Overall balance assessment:  (unable to get to sitting, etc)                                           Pertinent Vitals/Pain Pain Assessment Pain Assessment: 0-10 Pain Score: 10-Worst pain ever Pain Location: pt c/o pain with most activity but during attempt at mod/max assist log roll called out in pain calling it 10/10    Home Living Family/patient expects to be discharged to:: Private residence Living Arrangements:  Alone Available Help at Discharge: Family;Available PRN/intermittently Type of Home: House Home Access: Stairs to enter Entrance Stairs-Rails: Left Entrance Stairs-Number of Steps: 4 (B railings from back entrance; L railing from front entrance) Alternate Level Stairs-Number of Steps: flight of steps Home Layout: Two level;1/2 bath on main level;Bed/bath upstairs Home Equipment: Standard Walker;Cane - single point;Crutches;Rolling Walker (2 wheels) Additional  Comments: H/o ankle fx and R LE fx in November 2022.    Prior Function Prior Level of Function : Independent/Modified Independent;History of Falls (last six months);Driving             Mobility Comments: typically does not need AD, recently using walker, numerous falls in past 6 months due to syncope ADLs Comments: has been requiring assist for IADL from son and his wife, mod I for ADL     Hand Dominance        Extremity/Trunk Assessment   Upper Extremity Assessment Upper Extremity Assessment: Generalized weakness;Difficult to assess due to impaired cognition (able to elevate both shoulders to at least 90)    Lower Extremity Assessment Lower Extremity Assessment: Generalized weakness (able to do SLR b/l, AROM with hip/knees, R ankle lacks DF to neutral)       Communication   Communication:  (lethargic)  Cognition Arousal/Alertness: Lethargic Behavior During Therapy: Restless Overall Cognitive Status: Impaired/Different from baseline                                 General Comments: Pt did know the month and year and that she was at [facility] - which is a big improvement from last night.  Still lethargic and not close to her baseline.        General Comments General comments (skin integrity, edema, etc.): Pt continues to be lethargic, with restraints as she had been pulling out lines, etc last night. Unable/unsafe to do much mobility this date    Exercises General Exercises - Lower Extremity Ankle Circles/Pumps: PROM, AAROM, 5 reps Heel Slides: AROM, 5 reps (lightly resisted leg ext) Hip ABduction/ADduction: AROM, Strengthening, 5 reps Straight Leg Raises: AROM, 5 reps   Assessment/Plan    PT Assessment Patient needs continued PT services  PT Problem List Decreased strength;Decreased range of motion;Decreased activity tolerance;Decreased balance;Decreased mobility;Decreased knowledge of use of DME;Decreased safety awareness       PT Treatment  Interventions DME instruction;Gait training;Stair training;Functional mobility training;Therapeutic activities;Therapeutic exercise;Balance training;Neuromuscular re-education;Patient/family education;Cognitive remediation    PT Goals (Current goals can be found in the Care Plan section)  Acute Rehab PT Goals Patient Stated Goal: pt hopes to go home PT Goal Formulation: With patient/family Time For Goal Achievement: 02/02/23 Potential to Achieve Goals: Fair    Frequency Min 2X/week (QD when mental status/ability to participate improves)     Co-evaluation               AM-PAC PT "6 Clicks" Mobility  Outcome Measure Help needed turning from your back to your side while in a flat bed without using bedrails?: Total Help needed moving from lying on your back to sitting on the side of a flat bed without using bedrails?: Total Help needed moving to and from a bed to a chair (including a wheelchair)?: Total Help needed standing up from a chair using your arms (e.g., wheelchair or bedside chair)?: Total Help needed to walk in hospital room?: Total Help needed climbing 3-5 steps with a railing? : Total 6 Click Score: 6  End of Session   Activity Tolerance: Patient limited by lethargy;Patient limited by pain Patient left: in bed;with call bell/phone within reach;with family/visitor present   PT Visit Diagnosis: Muscle weakness (generalized) (M62.81);Difficulty in walking, not elsewhere classified (R26.2);Pain;Unsteadiness on feet (R26.81)    Time: [id number] PT Time Calculation (min) (ACUTE ONLY): 32 min   Charges:   PT Evaluation $PT Eval Moderate Complexity: 1 Mod PT Treatments $Therapeutic Exercise: 8-22 mins        Kreg Shropshire, DPT 01/20/2023, 6:21 PM

## 2023-01-20 NOTE — Consult Note (Signed)
Consult for agitation, anxiety, and patient refusing medications.  On assessment, she is snoring and does not awaken to two team members calling her name.  One RN stated she allowed the NG placement and received her medications.  This provider left her name with her primary RN to chat when she was awake and the psych team would assess.  Noted by MD of ICU delirium.  Waylan Boga, PMHNP

## 2023-01-20 NOTE — Progress Notes (Signed)
ANTICOAGULATION CONSULT NOTE  Pharmacy Consult for restarting apixaban Indication: atrial fibrillation  Allergies  Allergen Reactions   Desyrel [Trazodone] Rash   Sulfa Antibiotics Rash    Patient Measurements: Height: 5' 6"$  (167.6 cm) Weight: 81.6 kg (180 lb) IBW/kg (Calculated) : 59.3  Vital Signs: Temp: 96.9 F (36.1 C) (02/17 1218) Temp Source: Axillary (02/17 1218) BP: 88/68 (02/17 1200) Pulse Rate: 50 (02/17 1200)  Labs: Recent Labs    01/19/23 0545  HGB 12.1  HCT 37.1  PLT 313  CREATININE 1.04*    Estimated Creatinine Clearance: 47.2 mL/min (A) (by C-G formula based on SCr of 1.04 mg/dL (H)).   Medical History: Past Medical History:  Diagnosis Date   Arthritis    lumbar spine   CKD (chronic kidney disease), stage II    Colon cancer (HCC)    Depression    Dysphagia    Headache    migraine   Hx of adenomatous colonic polyps    Hyperlipidemia    Hypertension    PAF (paroxysmal atrial fibrillation) (HCC)    Paroxysmal atrial fibrillation (HCC)    Skin cancer    Syncope    Type 2 diabetes mellitus ([place]) 12/11/2022    Medications:  Scheduled:   acetaminophen  1,000 mg Oral Q6H   amiodarone  200 mg Oral Daily   Chlorhexidine Gluconate Cloth  6 each Topical Daily   estradiol  0.5 mg Oral QHS   levothyroxine  50 mcg Oral QAC breakfast   lidocaine  1 patch Transdermal Q24H   methocarbamol  500 mg Oral QID   mupirocin ointment  1 Application Nasal BID   pregabalin  25 mg Oral TID   QUEtiapine  50 mg Oral BID   sodium chloride flush  3 mL Intravenous Q12H   venlafaxine XR  150 mg Oral Daily    Assessment: 82 year old female presenting to [facility] ED from home on 01/16/23 with complaints of multiple falls and back pain. We are restarting apixaban which she is taking for atrial fibrillation  Goal of Therapy:  Monitor platelets by anticoagulation protocol: Yes   Plan:  ---restart apixaban 5 mg po BID ---CBC and SCr at least once weekly  Caroline Zamora 01/20/2023,1:58 PM

## 2023-01-20 NOTE — Progress Notes (Addendum)
Progress Note   Patient: Caroline Zamora [id number] DOB: January 21, 1941 DOA: 01/16/2023     4 DOS: the patient was seen and examined on 01/20/2023   Brief hospital course: 82 y.o. female with medical history significant of hypertension, hyperlipidemia, diastolic CHF, hypothyroidism, depression with anxiety, CKD 3A, PAF on Eliquis, colon cancer (s/p of surgery), orthostatic hypotension, who presents with frequent fall and back pain and was found to have T12 burst fracture  2/13: Neurosurgery consult 2/14: Worsening of T12 burst fracture which is unstable and now requiring surgery which is scheduled for Friday by neurosurgery 2/15: Transfer to stepdown and intensivist consult.  Dilaudid pump started, Lyrica 25 mg 3 times daily and 1 dose of Decadron 2/16: T10-L2 posterior fusion 2/17: Precedex started, psych consult  Assessment and Plan:  Acute metabolic encephalopathy Agitation, refusal of medications, anxiety -her hands are cuffed.  Will request NG tube for medications Required 1 dose of intramuscular Zyprexa last evening.  Intensivist consulted but have decided to follow peripherally.  Started Seroquel -On Precedex drip -Psych consult   Intractable back pain due to closed fracture of T12 vertebra: MRI showed subacute T12 body fracture with advanced height loss that is progressed from 12/11/2022 CT. Also progressed is retropulsion which causes lower cord compression.  Repeat x-ray on 2/14 showed worsening of T12 burst fracture with evidence of instability with her thoracic kyphosis now measuring 30 degree versus 18 degree on initial CT scan performed last month S/p T10-L2 posterior fusion on 2/16 -On fentanyl and oxycodone for pain control -Monitor in stepdown for now   Fall at home, initial encounter -Fall precaution -PT/OT once cleared by neurosurgeon   Atrial fibrillation, chronic (HCC) -Restarting Eliquis -Continue amiodarone for rate control   HTN (hypertension): Patient's  not taking medications.  -IV hydralazine as needed   Chronic kidney disease, stage 3a ([place]): Stable -Monitor BMP   Hypokalemia and hypophosphatemia: Repleted and resolved   Chronic diastolic CHF (congestive heart failure) ([place]): 2D echo 12/12/2022 showed EF of 55-60% with grade 1 diastolic dysfunction.  Patient does not have leg edema or JVD.  BNP normal 30.6.  CHF is compensated. -Watch volume status closely   Depression with anxiety -Continue Effexor.  Started Seroquel   Hypothyroidism: -Continue Synthroid   Orthostatic hypotension: -Florinef stopped per her outpatient cardiology Dr. Virl Axe request.  He sent me a secure chat yesterday so I have stopped it.         Subjective: Patient is snoring and obtunded.  Would not wake up on loud verbal commands-on Precedex drip  Physical Exam: Vitals:   01/20/23 1040 01/20/23 1100 01/20/23 1200 01/20/23 1218  BP: (!) 92/51 (!) 87/50 (!) 88/68   Pulse:  (!) 52 (!) 50   Resp:  18 18   Temp:    (!) 96.9 F (36.1 C)  TempSrc:    Axillary  SpO2:  97% 98%   Weight:      Height:       General: 82 year old female snoring and sleeping while on Precedex drip Cardiac: S1/S2, RRR, No murmurs, No gallops or rubs. Respiratory: No rales, wheezing, rhonchi or rubs. GI: Soft, benign Ext: No pitting leg edema bilaterally.  Skin: No rashes.  Neuro: Nonfocal, she is obtunded on Precedex drip Psych: Unable to evaluate being on Precedex drip Data Reviewed:  There are no new results to review at this time.  Family Communication: None at bedside  Patient remains at high risk for acute cardiorespiratory failure, multiorgan failure and death.  She will need close monitoring in stepdown.  She is hypotensive now but hopefully with Precedex coming off her blood pressure will improve I am increasing her normal saline from 50->75.  May need bolus if blood pressure does not improve and/or consideration for pressors.  CCM team is  aware  Disposition: Status is: Inpatient Remains inpatient appropriate because: Management of agitation, pain control  Planned Discharge Destination: Rehab   DVT prophylaxis-SCDs Time spent (critical care): 35 minutes  Author: Max Sane, MD 01/20/2023 1:46 PM  For on call review [email or web address].

## 2023-01-20 NOTE — Progress Notes (Signed)
Caroline Zamora is a 82 y.o. female who is POD 1 from T10-L2 decompression and fusion for T12 burst fracture.  Uncomplicated surgery.  Unfortunately, patient combative overnight and had to be sedateed and restrained.  Patient abl e to move all 4 extremitiies equally and appeared full strength.   Past Medical History:  Diagnosis Date   Arthritis    lumbar spine   CKD (chronic kidney disease), stage II    Colon cancer ([place])    Depression    Dysphagia    Headache    migraine   Hx of adenomatous colonic polyps    Hyperlipidemia    Hypertension    PAF (paroxysmal atrial fibrillation) (HCC)    Paroxysmal atrial fibrillation (HCC)    Skin cancer    Syncope    Type 2 diabetes mellitus ([place]) 12/11/2022    Past Surgical History: Past Surgical History:  Procedure Laterality Date   ABDOMINAL HYSTERECTOMY     with removal of tubes and ovaries   ANKLE FRACTURE SURGERY Right    COLONOSCOPY     COLONOSCOPY WITH PROPOFOL N/A 09/03/2017   Procedure: COLONOSCOPY WITH PROPOFOL;  Surgeon: Manya Silvas, MD;  [facility] ENDOSCOPY;  Service: Endoscopy;  Laterality: N/A;   COLONOSCOPY WITH PROPOFOL N/A 09/13/2020   Procedure: COLONOSCOPY WITH PROPOFOL;  Surgeon: Lesly Rubenstein, MD;  Location: [facility] ENDOSCOPY;  Service: Endoscopy;  Laterality: N/A;   DILATION AND CURETTAGE OF UTERUS     ESOPHAGOGASTRODUODENOSCOPY     ESOPHAGOGASTRODUODENOSCOPY (EGD) WITH PROPOFOL N/A 09/13/2020   Procedure: ESOPHAGOGASTRODUODENOSCOPY (EGD) WITH PROPOFOL;  Surgeon: Lesly Rubenstein, MD;  Location: [facility] ENDOSCOPY;  Service: Endoscopy;  Laterality: N/A;   LEFT HEART CATH AND CORONARY ANGIOGRAPHY N/A 04/17/2022   Procedure: LEFT HEART CATH AND CORONARY ANGIOGRAPHY;  Surgeon: Lorretta Harp, MD;  Location: [facility] CV LAB;  Service: Cardiovascular;  Laterality: N/A;   skin cancer removal     TONSILLECTOMY     TUBAL LIGATION      Problem List: Patient Active Problem List   Diagnosis  Date Noted   Other secondary kyphosis, thoracic region 01/19/2023   Thoracic spine instability 01/17/2023   Spinal stenosis, 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   Intractable back pain 01/16/2023   Closed unstable burst fracture of T12 vertebra ([place]) 01/16/2023   Fall at home, initial encounter 01/16/2023   HTN (hypertension) 01/16/2023   Chronic kidney disease, stage 3a ([place]) 01/16/2023   Chronic diastolic CHF (congestive heart failure) ([place]) 01/16/2023   Depression with anxiety 01/16/2023   Hypophosphatemia 01/16/2023   Hypothyroidism 01/16/2023   Orthostatic hypotension 12/14/2022   Orthostasis 12/13/2022   Anxiety attack 12/13/2022   Paroxysmal atrial fibrillation with RVR ([place]) 12/13/2022   Hypokalemia 12/12/2022   Atrial fibrillation, chronic (HCC) 12/12/2022   Prolonged Q-T interval on ECG 12/12/2022   Syncope 12/11/2022   Elevated troponin 12/11/2022   Prolonged QT interval 12/11/2022   CAP (community acquired pneumonia) 12/11/2022   Type 2 diabetes mellitus ([place]) 12/11/2022   Fall 12/11/2022   Weakness 05/16/2022   Diarrhea 05/16/2022   UTI (urinary tract infection) 05/16/2022   Recurrent syncope 04/15/2022   Syncope and collapse 04/14/2022   Depression 04/14/2022   HFrEF (heart failure with reduced ejection fraction) ([place]) 04/14/2022   A-fib ([place]) 07/04/2019    Allergies: Allergies as of 01/16/2023 - Reviewed 01/16/2023  Allergen Reaction Noted   Desyrel [trazodone] Rash 08/31/2017   Sulfa antibiotics Rash 08/31/2017    Medications: @ENCMED$ @  Social History: Social  History   Tobacco Use   Smoking status: Never   Smokeless tobacco: Never  Vaping Use   Vaping Use: Never used  Substance Use Topics   Alcohol use: Not Currently    Comment: a couple of glasses of wine per day   Drug use: No    Family Medical History: Family History  Problem Relation Age of Onset   Diabetes Mellitus II Mother    Atrial fibrillation Father    Breast cancer Neg Hx     Physical  Examination:  Patient sedated.  Opens eyes briefly.  Wiggles toes bilaterally to command.  Assessment and Plan: Caroline Zamora is a pleasant 82 y.o. female with s/p t10-l2 decompression and fusion for t12 burst fracture.  Plan: 1) Allow for sedation to wear off 2) Keep drains 3) Keep in ICU for expectant management.

## 2023-01-21 ENCOUNTER — Inpatient Hospital Stay: Payer: Self-pay

## 2023-01-21 DIAGNOSIS — M549 Dorsalgia, unspecified: Secondary | ICD-10-CM | POA: Diagnosis not present

## 2023-01-21 DIAGNOSIS — F418 Other specified anxiety disorders: Secondary | ICD-10-CM | POA: Diagnosis not present

## 2023-01-21 DIAGNOSIS — R410 Disorientation, unspecified: Secondary | ICD-10-CM | POA: Diagnosis not present

## 2023-01-21 DIAGNOSIS — S22082S Unstable burst fracture of T11-T12 vertebra, sequela: Secondary | ICD-10-CM | POA: Diagnosis not present

## 2023-01-21 DIAGNOSIS — I10 Essential (primary) hypertension: Secondary | ICD-10-CM | POA: Diagnosis not present

## 2023-01-21 LAB — CBC WITH DIFFERENTIAL/PLATELET
Abs Immature Granulocytes: 0.03 10*3/uL (ref 0.00–0.07)
Basophils Absolute: 0 10*3/uL (ref 0.0–0.1)
Basophils Relative: 0 %
Eosinophils Absolute: 0 10*3/uL (ref 0.0–0.5)
Eosinophils Relative: 0 %
HCT: 30.6 % — ABNORMAL LOW (ref 36.0–46.0)
Hemoglobin: 9.8 g/dL — ABNORMAL LOW (ref 12.0–15.0)
Immature Granulocytes: 0 %
Lymphocytes Relative: 17 %
Lymphs Abs: 1.4 10*3/uL (ref 0.7–4.0)
MCH: 30.1 pg (ref 26.0–34.0)
MCHC: 32 g/dL (ref 30.0–36.0)
MCV: 93.9 fL (ref 80.0–100.0)
Monocytes Absolute: 0.7 10*3/uL (ref 0.1–1.0)
Monocytes Relative: 9 %
Neutro Abs: 5.9 10*3/uL (ref 1.7–7.7)
Neutrophils Relative %: 74 %
Platelets: 221 10*3/uL (ref 150–400)
RBC: 3.26 MIL/uL — ABNORMAL LOW (ref 3.87–5.11)
RDW: 14.6 % (ref 11.5–15.5)
WBC: 8 10*3/uL (ref 4.0–10.5)
nRBC: 0 % (ref 0.0–0.2)

## 2023-01-21 LAB — BASIC METABOLIC PANEL
Anion gap: 9 (ref 5–15)
BUN: 32 mg/dL — ABNORMAL HIGH (ref 8–23)
CO2: 21 mmol/L — ABNORMAL LOW (ref 22–32)
Calcium: 7.2 mg/dL — ABNORMAL LOW (ref 8.9–10.3)
Chloride: 109 mmol/L (ref 98–111)
Creatinine, Ser: 0.89 mg/dL (ref 0.44–1.00)
GFR, Estimated: >60 mL/min (ref >60)
Glucose, Bld: 83 mg/dL (ref 70–99)
Potassium: 3 mmol/L — ABNORMAL LOW (ref 3.5–5.1)
Sodium: 139 mmol/L (ref 135–145)

## 2023-01-21 LAB — MAGNESIUM: Magnesium: 2.2 mg/dL (ref 1.7–2.4)

## 2023-01-21 LAB — VITAMIN B12: Vitamin B-12: 223 pg/mL (ref 180–914)

## 2023-01-21 LAB — TSH: TSH: 0.898 u[IU]/mL (ref 0.350–4.500)

## 2023-01-21 LAB — GLUCOSE, CAPILLARY
Glucose-Capillary: 79 mg/dL (ref 70–99)
Glucose-Capillary: 70 mg/dL (ref 70–99)

## 2023-01-21 LAB — T4, FREE: Free T4: 1.66 ng/dL — ABNORMAL HIGH (ref 0.61–1.12)

## 2023-01-21 LAB — PHOSPHORUS: Phosphorus: 2.2 mg/dL — ABNORMAL LOW (ref 2.5–4.6)

## 2023-01-21 MED ORDER — SODIUM CHLORIDE 0.9% FLUSH
10.0000 mL | Freq: Two times a day (BID) | INTRAVENOUS | Status: DC
  Administered 2023-01-22 – 2023-01-26 (×10): 10 mL

## 2023-01-21 MED ORDER — DIVALPROEX SODIUM 250 MG PO DR TAB
250.0000 mg | DELAYED_RELEASE_TABLET | Freq: Two times a day (BID) | ORAL | Status: DC
  Administered 2023-01-21 – 2023-01-26 (×11): 250 mg via ORAL
  Filled 2023-01-21 (×12): qty 1

## 2023-01-21 MED ORDER — HYDRALAZINE HCL 25 MG PO TABS
25.0000 mg | ORAL_TABLET | Freq: Three times a day (TID) | ORAL | Status: DC
  Administered 2023-01-21 – 2023-01-22 (×3): 25 mg via ORAL
  Filled 2023-01-21 (×4): qty 1

## 2023-01-21 MED ORDER — POTASSIUM CHLORIDE CRYS ER 20 MEQ PO TBCR
40.0000 meq | EXTENDED_RELEASE_TABLET | Freq: Once | ORAL | Status: AC
  Administered 2023-01-21: 40 meq via ORAL
  Filled 2023-01-21: qty 2

## 2023-01-21 MED ORDER — SODIUM CHLORIDE 0.9% FLUSH: 10.0000 mL | INTRAVENOUS | Status: DC | PRN

## 2023-01-21 MED ORDER — LACTATED RINGERS IV BOLUS: 1000.0000 mL | Freq: Once | INTRAVENOUS | Status: DC

## 2023-01-21 MED ORDER — QUETIAPINE FUMARATE 25 MG PO TABS
50.0000 mg | ORAL_TABLET | Freq: Three times a day (TID) | ORAL | Status: DC
  Administered 2023-01-21 – 2023-01-22 (×5): 50 mg via ORAL
  Filled 2023-01-21 (×5): qty 2

## 2023-01-21 NOTE — Progress Notes (Signed)
Caroline Zamora is a 82 y.o. female who is POD 2 from T10-L2 decompression and fusion for T12 burst fracture.   Uncomplicated surgery.  Unfortunately, patient combative overnight and had to be sedateed and restrained.  Patient abl e to move all 4 extremitiies equally and appeared full strength.  She is still confused, but more awake.         Past Medical History:  Diagnosis Date   Arthritis      lumbar spine   CKD (chronic kidney disease), stage II     Colon cancer ([place])     Depression     Dysphagia     Headache      migraine   Hx of adenomatous colonic polyps     Hyperlipidemia     Hypertension     PAF (paroxysmal atrial fibrillation) (HCC)     Paroxysmal atrial fibrillation (HCC)     Skin cancer     Syncope     Type 2 diabetes mellitus ([place]) 12/11/2022      Past Surgical History:      Past Surgical History:  Procedure Laterality Date   ABDOMINAL HYSTERECTOMY        with removal of tubes and ovaries   ANKLE FRACTURE SURGERY Right     COLONOSCOPY       COLONOSCOPY WITH PROPOFOL N/A 09/03/2017    Procedure: COLONOSCOPY WITH PROPOFOL;  Surgeon: Manya Silvas, MD;  Location: [facility] ENDOSCOPY;  Service: Endoscopy;  Laterality: N/A;   COLONOSCOPY WITH PROPOFOL N/A 09/13/2020    Procedure: COLONOSCOPY WITH PROPOFOL;  Surgeon: Lesly Rubenstein, MD;  Location: [facility] ENDOSCOPY;  Service: Endoscopy;  Laterality: N/A;   DILATION AND CURETTAGE OF UTERUS       ESOPHAGOGASTRODUODENOSCOPY       ESOPHAGOGASTRODUODENOSCOPY (EGD) WITH PROPOFOL N/A 09/13/2020    Procedure: ESOPHAGOGASTRODUODENOSCOPY (EGD) WITH PROPOFOL;  Surgeon: Lesly Rubenstein, MD;  Location: [facility] ENDOSCOPY;  Service: Endoscopy;  Laterality: N/A;   LEFT HEART CATH AND CORONARY ANGIOGRAPHY N/A 04/17/2022    Procedure: LEFT HEART CATH AND CORONARY ANGIOGRAPHY;  Surgeon: Lorretta Harp, MD;  Location: [place] CV LAB;  Service: Cardiovascular;  Laterality: N/A;   skin cancer removal       TONSILLECTOMY        TUBAL LIGATION          Problem List:     Patient Active Problem List    Diagnosis Date Noted   Other secondary kyphosis, thoracic region 01/19/2023   Thoracic spine instability 01/17/2023   Spinal stenosis, 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   Intractable back pain 01/16/2023   Closed unstable burst fracture of T12 vertebra ([place]) 01/16/2023   Fall at home, initial encounter 01/16/2023   HTN (hypertension) 01/16/2023   Chronic kidney disease, stage 3a ([place]) 01/16/2023   Chronic diastolic CHF (congestive heart failure) ([place]) 01/16/2023   Depression with anxiety 01/16/2023   Hypophosphatemia 01/16/2023   Hypothyroidism 01/16/2023   Orthostatic hypotension 12/14/2022   Orthostasis 12/13/2022   Anxiety attack 12/13/2022   Paroxysmal atrial fibrillation with RVR ([place]) 12/13/2022   Hypokalemia 12/12/2022   Atrial fibrillation, chronic ([place]) 12/12/2022   Prolonged Q-T interval on ECG 12/12/2022   Syncope 12/11/2022   Elevated troponin 12/11/2022   Prolonged QT interval 12/11/2022   CAP (community acquired pneumonia) 12/11/2022   Type 2 diabetes mellitus ([place]) 12/11/2022   Fall 12/11/2022   Weakness 05/16/2022   Diarrhea 05/16/2022   UTI (urinary tract infection) 05/16/2022   Recurrent syncope 04/15/2022  Syncope and collapse 04/14/2022   Depression 04/14/2022   HFrEF (heart failure with reduced ejection fraction) ([place]) 04/14/2022   A-fib ([place]) 07/04/2019      Allergies:      Allergies as of 01/16/2023 - Reviewed 01/16/2023  Allergen Reaction Noted   Desyrel [trazodone] Rash 08/31/2017   Sulfa antibiotics Rash 08/31/2017      Medications: @ENCMED$ @   Social History: Social History         Tobacco Use   Smoking status: Never   Smokeless tobacco: Never  Vaping Use   Vaping Use: Never used  Substance Use Topics   Alcohol use: Not Currently      Comment: a couple of glasses of wine per day   Drug use: No      Family Medical History:      Family History  Problem Relation  Age of Onset   Diabetes Mellitus II Mother     Atrial fibrillation Father     Breast cancer Neg Hx        Physical Examination:   Eyes open.  Says first name is Caroline Zamora.  Will not answer questions about time or place.    On limited physical examination, is full strength in her bilateral hip flexors and wiggling toes bilaterally.   Assessment and Plan: Caroline Zamora is a pleasant 82 y.o. female with s/p t10-l2 decompression and fusion for t12 burst fracture.   Plan: 1) Sedation is wearing off 2) Keep drains 3) PT/OT

## 2023-01-21 NOTE — Consult Note (Signed)
I have seen and examined the patient.   Surly and full of personality but slept comfortably on scheduled seroquel. Issues with hypotension resolve with fluids and being awake.   We will sign off at this time.   Please re-consult [place] if any issues arise.   Arcelia Jew MD Velora Heckler pCCM

## 2023-01-21 NOTE — Progress Notes (Signed)

## 2023-01-21 NOTE — Consult Note (Signed)
[facility] Consult   Reason for Consult:  delirium, agitation Referring Physician:  Dr Manuella Ghazi Patient Identification: Caroline Zamora MRN:  [id number] Principal Diagnosis: Intractable back pain Diagnosis:  Principal Problem:   Intractable back pain Active Problems:   Acute delirium   Hypokalemia   Atrial fibrillation, chronic (HCC)   Orthostatic hypotension   Closed unstable burst fracture of T12 vertebra ([place])   Fall at home, initial encounter   HTN (hypertension)   Chronic kidney disease, stage 3a ([place])   Chronic diastolic CHF (congestive heart failure) ([place])   Depression with anxiety   Hypophosphatemia   Hypothyroidism   Thoracic spine instability   Spinal stenosis, thoracic   Other secondary kyphosis, thoracic region   Total Time spent with patient: 45 minutes  Subjective:   Caroline Zamora is a 82 y.o. female patient admitted with back issues, consult for agitation.  HPI:  82 yo female transferred today from ICU after back surgery.  Yesterday, she would not awaken for assessment nor did she later for PT/OT.  Today, she is in hand restraints and repetitively saying, "My voice is my password, please verify me."  After redirection, she continued with "go check or they will kill me.  My social security number is [id number]."  Then, she became disorganized with wanting the psych team to read the fine lines on a dollar bill that was not there to asking how many ghosts were there and how tall were they."  Unable to get any additional information related to her delirious state and she seemed to get more agitated when the team could not provide answers to her questions.  Medications adjusted to assist which should not be needed once her medical issue is resolved along with her delirium.  Past Psychiatric History: depression  Risk to Self:  yes, agitation Risk to Others:  none Prior Inpatient Therapy:  none Prior Outpatient Therapy:  unknown  Past Medical  History:  Past Medical History:  Diagnosis Date   Arthritis    lumbar spine   CKD (chronic kidney disease), stage II    Colon cancer ([place])    Depression    Dysphagia    Headache    migraine   Hx of adenomatous colonic polyps    Hyperlipidemia    Hypertension    PAF (paroxysmal atrial fibrillation) (HCC)    Paroxysmal atrial fibrillation ([place])    Skin cancer    Syncope    Type 2 diabetes mellitus ([place]) 12/11/2022    Past Surgical History:  Procedure Laterality Date   ABDOMINAL HYSTERECTOMY     with removal of tubes and ovaries   ANKLE FRACTURE SURGERY Right    COLONOSCOPY     COLONOSCOPY WITH PROPOFOL N/A 09/03/2017   Procedure: COLONOSCOPY WITH PROPOFOL;  Surgeon: Manya Silvas, MD;  Location[facility] ENDOSCOPY;  Service: Endoscopy;  Laterality: N/A;   COLONOSCOPY WITH PROPOFOL N/A 09/13/2020   Procedure: COLONOSCOPY WITH PROPOFOL;  Surgeon: Lesly Rubenstein, MD;  Location: [facility] ENDOSCOPY;  Service: Endoscopy;  Laterality: N/A;   DILATION AND CURETTAGE OF UTERUS     ESOPHAGOGASTRODUODENOSCOPY     ESOPHAGOGASTRODUODENOSCOPY (EGD) WITH PROPOFOL N/A 09/13/2020   Procedure: ESOPHAGOGASTRODUODENOSCOPY (EGD) WITH PROPOFOL;  Surgeon: Lesly Rubenstein, MD;  Location: [facility] ENDOSCOPY;  Service: Endoscopy;  Laterality: N/A;   LEFT HEART CATH AND CORONARY ANGIOGRAPHY N/A 04/17/2022   Procedure: LEFT HEART CATH AND CORONARY ANGIOGRAPHY;  Surgeon: Lorretta Harp, MD;  Location: [facility];  Service: Cardiovascular;  Laterality: N/A;   skin cancer removal     TONSILLECTOMY     TUBAL LIGATION     Family History:  Family History  Problem Relation Age of Onset   Diabetes Mellitus II Mother    Atrial fibrillation Father    Breast cancer Neg Hx    Family Psychiatric  History: see above Social History:  Social History   Substance and Sexual Activity  Alcohol Use Not Currently   Comment: a couple of glasses of wine per day     Social History   Substance and Sexual  Activity  Drug Use No    Social History   Socioeconomic History   Marital status: Widowed    Spouse name: Not on file   Number of children: Not on file   Years of education: Not on file   Highest education level: Not on file  Occupational History   Not on file  Tobacco Use   Smoking status: Never   Smokeless tobacco: Never  Vaping Use   Vaping Use: Never used  Substance and Sexual Activity   Alcohol use: Not Currently    Comment: a couple of glasses of wine per day   Drug use: No   Sexual activity: Not on file  Other Topics Concern   Not on file  Social History Narrative   Not on file   Social Determinants of Health   Financial Resource Strain: Not on file  Food Insecurity: No Food Insecurity (12/12/2022)   Hunger Vital Sign    Worried About Running Out of Food in the Last Year: Never true    Ran Out of Food in the Last Year: Never true  Transportation Needs: No Transportation Needs (12/12/2022)   PRAPARE - Hydrologist (Medical): No    Lack of Transportation (Non-Medical): No  Physical Activity: Not on file  Stress: Not on file  Social Connections: Not on file   Additional Social History:    Allergies:   Allergies  Allergen Reactions   Desyrel [Trazodone] Rash   Sulfa Antibiotics Rash    Labs:  Results for orders placed or performed during the hospital encounter of 01/16/23 (from the past 48 hour(s))  Glucose, capillary     Status: Abnormal   Collection Time: 01/19/23  3:58 PM  Result Value Ref Range   Glucose-Capillary 122 (H) 70 - 99 mg/dL    Comment: Glucose reference range applies only to samples taken after fasting for at least 8 hours.  Glucose, capillary     Status: Abnormal   Collection Time: 01/20/23  7:25 AM  Result Value Ref Range   Glucose-Capillary 140 (H) 70 - 99 mg/dL    Comment: Glucose reference range applies only to samples taken after fasting for at least 8 hours.  Glucose, capillary     Status: None    Collection Time: 01/21/23  1:45 AM  Result Value Ref Range   Glucose-Capillary 79 70 - 99 mg/dL    Comment: Glucose reference range applies only to samples taken after fasting for at least 8 hours.  CBC with Differential/Platelet     Status: Abnormal   Collection Time: 01/21/23  4:43 AM  Result Value Ref Range   WBC 8.0 4.0 - 10.5 K/uL   RBC 3.26 (L) 3.87 - 5.11 MIL/uL   Hemoglobin 9.8 (L) 12.0 - 15.0 g/dL   HCT 30.6 (L) 36.0 - 46.0 %   MCV 93.9 80.0 - 100.0 fL   MCH 30.1  26.0 - 34.0 pg   MCHC 32.0 30.0 - 36.0 g/dL   RDW 14.6 11.5 - 15.5 %   Platelets 221 150 - 400 K/uL   nRBC 0.0 0.0 - 0.2 %   Neutrophils Relative % 74 %   Neutro Abs 5.9 1.7 - 7.7 K/uL   Lymphocytes Relative 17 %   Lymphs Abs 1.4 0.7 - 4.0 K/uL   Monocytes Relative 9 %   Monocytes Absolute 0.7 0.1 - 1.0 K/uL   Eosinophils Relative 0 %   Eosinophils Absolute 0.0 0.0 - 0.5 K/uL   Basophils Relative 0 %   Basophils Absolute 0.0 0.0 - 0.1 K/uL   Immature Granulocytes 0 %   Abs Immature Granulocytes 0.03 0.00 - 0.07 K/uL    Comment: Performed at [facility], [address], [place], [place] [id number]  Basic metabolic panel     Status: Abnormal   Collection Time: 01/21/23  4:43 AM  Result Value Ref Range   Sodium 139 135 - 145 mmol/L   Potassium 3.0 (L) 3.5 - 5.1 mmol/L   Chloride 109 98 - 111 mmol/L   CO2 21 (L) 22 - 32 mmol/L   Glucose, Bld 83 70 - 99 mg/dL    Comment: Glucose reference range applies only to samples taken after fasting for at least 8 hours.   BUN 32 (H) 8 - 23 mg/dL   Creatinine, Ser 0.89 0.44 - 1.00 mg/dL   Calcium 7.2 (L) 8.9 - 10.3 mg/dL   GFR, Estimated >60 >60 mL/min    Comment: (NOTE) Calculated using the CKD-EPI Creatinine Equation (2021)    Anion gap 9 5 - 15    Comment: Performed at [facility], [address] [place], [place] [address]  Magnesium     Status: None   Collection Time: 01/21/23  4:43 AM  Result Value Ref Range   Magnesium 2.2 1.7 - 2.4  mg/dL    Comment: Performed at [facility], [address] [place], [place] [address]  Phosphorus     Status: Abnormal   Collection Time: 01/21/23  4:43 AM  Result Value Ref Range   Phosphorus 2.2 (L) 2.5 - 4.6 mg/dL    Comment: Performed at [facility], [address] [place], [place] [address]  TSH     Status: None   Collection Time: 01/21/23  4:43 AM  Result Value Ref Range   TSH 0.898 0.350 - 4.500 uIU/mL    Comment: Performed by a 3rd Generation assay with a functional sensitivity of <=0.01 uIU/mL. Performed at [facility], [address] [place], [place] [address]   T4, free     Status: Abnormal   Collection Time: 01/21/23  4:43 AM  Result Value Ref Range   Free T4 1.66 (H) 0.61 - 1.12 ng/dL    Comment: (NOTE) Biotin ingestion may interfere with free T4 tests. If the results are inconsistent with the TSH level, previous test results, or the clinical presentation, then consider biotin interference. If needed, order repeat testing after stopping biotin. Performed at [facility], [address] [place], [place] [address]   Vitamin B12     Status: None   Collection Time: 01/21/23  4:43 AM  Result Value Ref Range   Vitamin B-12 223 180 - 914 pg/mL    Comment: (NOTE) This assay is not validated for testing neonatal or myeloproliferative syndrome specimens for Vitamin B12 levels. Performed at [facility], [address] [place],  [place] [address]   Glucose, capillary     Status: None   Collection Time: 01/21/23  7:52 AM  Result Value Ref Range   Glucose-Capillary 70 70 - 99 mg/dL    Comment: Glucose reference range applies only to samples taken after fasting for at least 8 hours.    Current Facility-Administered Medications  Medication Dose Route Frequency Provider Last Rate Last Admin   0.9 %  sodium chloride infusion  250 mL Intravenous Continuous Loleta Dicker, PA   Paused at 01/19/23 1815   0.9 %  sodium  chloride infusion   Intravenous Continuous Max Sane, MD 75 mL/hr at 01/21/23 0800 Infusion Verify at 01/21/23 0800   acetaminophen (TYLENOL) tablet 1,000 mg  1,000 mg Oral Q6H Loleta Dicker, PA   1,000 mg at 01/21/23 1102   amiodarone (PACERONE) tablet 200 mg  200 mg Oral Daily Loleta Dicker, PA   200 mg at 01/21/23 1102   apixaban (ELIQUIS) tablet 5 mg  5 mg Oral BID Dallie Piles, RPH   5 mg at 01/21/23 1102   Chlorhexidine Gluconate Cloth 2 % PADS 6 each  6 each Topical Daily Loleta Dicker, PA   6 each at 01/20/23 [id number]   diphenhydrAMINE (BENADRYL) injection 12.5 mg  12.5 mg Intravenous Q8H PRN Loleta Dicker, PA       divalproex (DEPAKOTE) DR tablet 250 mg  250 mg Oral Q12H Patrecia Pour, NP       estradiol (ESTRACE) tablet 0.5 mg  0.5 mg Oral QHS Loleta Dicker, PA   0.5 mg at 01/20/23 2130   fentaNYL (SUBLIMAZE) injection 50 mcg  50 mcg Intravenous Q2H PRN Max Sane, MD       hydrALAZINE (APRESOLINE) injection 5 mg  5 mg Intravenous Q2H PRN Loleta Dicker, PA   5 mg at 01/19/23 1007   hydrALAZINE (APRESOLINE) tablet 25 mg  25 mg Oral Q8H Max Sane, MD       levothyroxine (SYNTHROID) tablet 50 mcg  50 mcg Oral QAC breakfast Loleta Dicker, PA   50 mcg at 01/21/23 0551   lidocaine (LIDODERM) 5 % 1 patch  1 patch Transdermal Q24H Loleta Dicker, [place]   1 patch at 01/21/23 0551   magnesium hydroxide (MILK OF MAGNESIA) suspension 30 mL  30 mL Oral Daily PRN Loleta Dicker, PA       melatonin tablet 5 mg  5 mg Oral QHS PRN Loleta Dicker, PA   5 mg at 01/18/23 2351   menthol-cetylpyridinium (CEPACOL) lozenge 3 mg  1 lozenge Oral PRN Loleta Dicker, PA       Or   phenol (CHLORASEPTIC) mouth spray 1 spray  1 spray Mouth/Throat PRN Loleta Dicker, PA       methocarbamol (ROBAXIN) tablet 500 mg  500 mg Oral QID Loleta Dicker, PA   500 mg at 01/21/23 1102   mupirocin ointment (BACTROBAN) 2 % 1 Application  1 Application Nasal BID Loleta Dicker, PA   1 Application at [id number]   oxyCODONE (Oxy IR/ROXICODONE) immediate release tablet 5 mg  5 mg Oral Q4H PRN Loleta Dicker, PA   5 mg at 01/21/23 0145   QUEtiapine (SEROQUEL) tablet 50 mg  50 mg Oral TID Patrecia Pour, NP       sodium chloride flush (NS) 0.9 % injection 3 mL  3 mL Intravenous Q12H Loleta Dicker, PA   3 mL at  01/20/23 2130   sodium chloride flush (NS) 0.9 % injection 3 mL  3 mL Intravenous PRN Loleta Dicker, PA       venlafaxine XR (EFFEXOR-XR) 24 hr capsule 150 mg  150 mg Oral Daily Loleta Dicker, [place]   150 mg at 01/21/23 1102    Musculoskeletal: Strength & Muscle Tone: increased Gait & Station:  did not witness Patient leans: N/A  Psychiatric Specialty Exam: Physical Exam Vitals and nursing note reviewed.  HENT:     Head: Normocephalic.     Nose: Nose normal.  Pulmonary:     Effort: Pulmonary effort is normal.  Psychiatric:        Attention and Perception: She is inattentive. She perceives visual hallucinations.        Mood and Affect: Mood is anxious. Affect is blunt.        Speech: Speech normal.        Behavior: Behavior is agitated. Behavior is cooperative.        Thought Content: Thought content is delusional.        Cognition and Memory: Cognition is impaired. Memory is impaired.        Judgment: Judgment is inappropriate.     Review of Systems  Musculoskeletal:  Positive for back pain.  Psychiatric/Behavioral:  Positive for hallucinations and memory loss. The patient is nervous/anxious.   All other systems reviewed and are negative.   Blood pressure (!) 165/100, pulse 86, temperature 98.2 F (36.8 C), resp. rate 20, height 5' 6"$  (1.676 m), weight 81.6 kg, SpO2 93 %.Body mass index is 29.05 kg/m.  General Appearance: Disheveled  Eye Contact:  Fair  Speech:  Clear and Coherent  Volume:  Normal  Mood:  Anxious and Irritable  Affect:  Blunt  Thought Process:  Disorganized  Orientation:  Other:  person   Thought Content:  Delusions and Hallucinations: Visual  Suicidal Thoughts:  UTA, unable to assess  Homicidal Thoughts:  UTA  Memory:  Immediate;   Poor Recent;   Poor Remote;   Poor  Judgement:  Impaired  Insight:  Lacking  Psychomotor Activity:  Increased  Concentration:  Concentration: Poor and Attention Span: Poor  Recall:  Poor  Fund of Knowledge:  UTA  Language:  Fair  Akathisia:  No  Handed:  Right  AIMS (if indicated):     Assets:  Leisure Time Resilience Social Support  ADL's:  Impaired  Cognition:  Impaired,  Moderate  Sleep:       Physical Exam: Physical Exam Vitals and nursing note reviewed.  HENT:     Head: Normocephalic.     Nose: Nose normal.  Pulmonary:     Effort: Pulmonary effort is normal.  Psychiatric:        Attention and Perception: She is inattentive. She perceives visual hallucinations.        Mood and Affect: Mood is anxious. Affect is blunt.        Speech: Speech normal.        Behavior: Behavior is agitated. Behavior is cooperative.        Thought Content: Thought content is delusional.        Cognition and Memory: Cognition is impaired. Memory is impaired.        Judgment: Judgment is inappropriate.    Review of Systems  Musculoskeletal:  Positive for back pain.  Psychiatric/Behavioral:  Positive for hallucinations and memory loss. The patient is nervous/anxious.   All other systems reviewed and are negative.  Blood  pressure (!) 165/100, pulse 86, temperature 98.2 F (36.8 C), resp. rate 20, height 5' 6"$  (1.676 m), weight 81.6 kg, SpO2 93 %. Body mass index is 29.05 kg/m.  Treatment Plan Summary: Daily contact with patient to assess and evaluate symptoms and progress in treatment, Medication management, and Plan : Delirium: Started Depakote 250 mg BID Increased Seroquel 50 mg BID to TID; if this is not effective would change to Risperdal 1 mg BID  Patient likely has ongoing delirium which presents as fluctuating cognition.    The  patient's exam is notable for altered sensorium, perceptual disturbances, disorientation and cognitive deficits that appear markedly different than their baseline, suggesting a diagnosis of delirium.  Virtually any medical condition or physiologic stress can precipitate delirium in a susceptible individual, with risk increasing in those with: advanced age, sensory impairments, organic brain disease (stroke, dementia, Parkinsons), psychiatric illness, major chronic medical issues, prolonged hospitalizations, postoperative status, anemia, insomnia/disturbed sleep, and severe pain. Addressing the underlying medical condition and institution of preventative measures are recommended.  - Continue to monitor and treat underlying medical causes of delirium, including infection, electrolyte disturbances, etc. - Delirium precautions - Minimize/avoid deliriogenic meds including: anticholinergic, opiates, benzodiazepines           - Maintain hydration, oxygenation, nutrition           - Limit use of restraints and catheters           - Normalize sleep patterns by minimizing nighttime noise, light and interruptions by     -Ensure sleep apnea treatment is provided overnight.             clustering care, opening blinds during the day           - Reorient the patient frequently, provide easily visible clock and calendar           - Provide sensory aids like glasses, hearing aids           - Encourage ambulation, regular activities and visitors to maintain cognitive stimulation   -Patient would benefit from having family members at bedside to reinforce his orientation.  Disposition: Patient does not meet criteria for psychiatric inpatient admission.  Waylan Boga, NP 01/21/2023 11:44 AM

## 2023-01-21 NOTE — Progress Notes (Signed)
Progress Note   Patient: Caroline Zamora [id number] DOB: 12/26/1940 DOA: 01/16/2023     5 DOS: the patient was seen and examined on 01/21/2023   Brief hospital course: 82 y.o. female with medical history significant of hypertension, hyperlipidemia, diastolic CHF, hypothyroidism, depression with anxiety, CKD 3A, PAF on Eliquis, colon cancer (s/p of surgery), orthostatic hypotension, who presents with frequent fall and back pain and was found to have T12 burst fracture  2/13: Neurosurgery consult 2/14: Worsening of T12 burst fracture which is unstable and now requiring surgery which is scheduled for Friday by neurosurgery 2/15: Transfer to stepdown and intensivist consult.  Dilaudid pump started, Lyrica 25 mg 3 times daily and 1 dose of Decadron 2/16: T10-L2 posterior fusion 2/17: Precedex started, psych consult 2/18: Off Precedex.  Started Seroquel and Depakote for mood.  Transfer to floor  Assessment and Plan: Acute metabolic encephalopathy Agitation, refusal of medications, anxiety -her hands are cuffed.  Will request NG tube for medications -Off Precedex drip -Psych consult -started Seroquel and Depakote   Intractable back pain due to closed fracture of T12 vertebra: MRI showed subacute T12 body fracture with advanced height loss that is progressed from 12/11/2022 CT. Also progressed is retropulsion which causes lower cord compression.  Repeat x-ray on 2/14 showed worsening of T12 burst fracture with evidence of instability with her thoracic kyphosis now measuring 30 degree versus 18 degree on initial CT scan performed last month S/p T10-L2 posterior fusion on 2/16 -On fentanyl and oxycodone for pain control -Transfer to floor   Fall at home, initial encounter -Fall precaution -PT/OT recommends SNF, TOC consult for SNF placement   Atrial fibrillation, chronic (HCC) -Continue Eliquis for anticoagulation -Continue amiodarone for rate control   HTN (hypertension): Starting  oral hydralazine for better blood pressure control -IV hydralazine as needed   Chronic kidney disease, stage 3a ([place]): Stable -Monitor BMP   Hypokalemia and hypophosphatemia: Repleted and resolved   Chronic diastolic CHF (congestive heart failure) ([place]): 2D echo 12/12/2022 showed EF of 55-60% with grade 1 diastolic dysfunction.  Patient does not have leg edema or JVD.  BNP normal 30.6.  CHF is compensated. -Watch volume status closely   Depression with anxiety -Continue Effexor.  Started Seroquel and Depakote   Hypothyroidism: -Continue Synthroid   Orthostatic hypotension: -Florinef stopped per her outpatient cardiology Dr. Virl Axe request.  He sent me a secure chat yesterday so I have stopped it.      Subjective: Patient gets combative and agitated when awake, she kept repeating to get her SCDs off right away.  Unable to carry coherent conversation  Patient oriented to self and place but unable to carry on a coherent conversation. Patient is combative, swearing at staff and swinging hands attempting to hit staff when trying to transfer to a new bed. Patient remains in bilateral wrist restraints   Physical Exam: Vitals:   01/21/23 0700 01/21/23 0800 01/21/23 0900 01/21/23 1021  BP: (!) 144/71 (!) 152/63 (!) 155/116 (!) 165/100  Pulse: 78 80 80 86  Resp: 18 (!) 24 (!) 30 20  Temp:  97.9 F (36.6 C)  98.2 F (36.8 C)  TempSrc:  Axillary    SpO2: 97% 98% 100% 93%  Weight:      Height:       General: 82 year old female lying in the bed comfortably and agitated at times Cardiac: S1/S2, RRR, No murmurs, No gallops or rubs. Respiratory: No rales, wheezing, rhonchi or rubs. GI: Soft, benign Ext: No pitting leg  edema bilaterally.  Skin: No rashes.  Neuro: Nonfocal, awake and oriented to self and place but unable to carry quadrant conversation.  She has bilateral wrist restraints as she gets combative at staff Psych: Intermittent agitation Data Reviewed:  There are no new  results to review at this time.  Family Communication: None at bedside  Disposition: Status is: Inpatient Remains inpatient appropriate because: Mental status, needs placement  Planned Discharge Destination: Skilled nursing facility   DVT prophylaxis-Eliquis Time spent: 35 minutes  Author: Max Sane, MD 01/21/2023 11:33 AM  For on call review [email or web address].

## 2023-01-21 NOTE — Progress Notes (Signed)
No family at bedside to give consent.  Pt very agitated and combative, slapping and pinching this Probation officer.  Dr Manuella Ghazi approved and ordered cancellation of the PICC and placement of a midline.  Required 2 people to hold pt for placement.

## 2023-01-21 NOTE — Progress Notes (Signed)
Secure chat with Dr Manuella Ghazi re PICC ordered.  Per notes, pt pulling out IV's, swearing and combative.  Noted concern to maintain sterility and safety during procedure as well as the pt pulling PICC line out.  Dr Manuella Ghazi states to proceed with PICC line placement with possibility of placing pt in mitts.

## 2023-01-21 NOTE — Progress Notes (Signed)
Patient oriented to self and place but unable to carry on a coherent conversation. Patient is combative, swearing at staff and swinging hands attempting to hit staff when trying to transfer to a new bed. Patient remains in bilateral wrist restraints. Patient transferred to new room on 1A. Handoff given to accepting RN. Patient denies any needs at this time.

## 2023-01-21 NOTE — Progress Notes (Signed)
A consult was placed to the hospital's IV nurse for new iv access; both arms assessed; multiple bruises, and skin tears noted, as well as an infiltrate in the left forearm;  attempted x 1 with ultrasound, and with the assistance of the RN holding the pt's arm; pt's son also at the bedside; pt is suspicious of all care being done, very anxious, repetative  with questions; very poor veins; no further attempts made;  RN to contact MD.

## 2023-01-22 DIAGNOSIS — N1831 Chronic kidney disease, stage 3a: Secondary | ICD-10-CM | POA: Diagnosis not present

## 2023-01-22 DIAGNOSIS — R296 Repeated falls: Secondary | ICD-10-CM

## 2023-01-22 DIAGNOSIS — M549 Dorsalgia, unspecified: Secondary | ICD-10-CM | POA: Diagnosis not present

## 2023-01-22 DIAGNOSIS — S22082S Unstable burst fracture of T11-T12 vertebra, sequela: Secondary | ICD-10-CM | POA: Diagnosis not present

## 2023-01-22 DIAGNOSIS — Z515 Encounter for palliative care: Secondary | ICD-10-CM

## 2023-01-22 DIAGNOSIS — F418 Other specified anxiety disorders: Secondary | ICD-10-CM | POA: Diagnosis not present

## 2023-01-22 DIAGNOSIS — R410 Disorientation, unspecified: Secondary | ICD-10-CM | POA: Diagnosis not present

## 2023-01-22 LAB — CBC
HCT: 30.9 % — ABNORMAL LOW (ref 36.0–46.0)
Hemoglobin: 9.9 g/dL — ABNORMAL LOW (ref 12.0–15.0)
MCH: 29.8 pg (ref 26.0–34.0)
MCHC: 32 g/dL (ref 30.0–36.0)
MCV: 93.1 fL (ref 80.0–100.0)
Platelets: 232 10*3/uL (ref 150–400)
RBC: 3.32 MIL/uL — ABNORMAL LOW (ref 3.87–5.11)
RDW: 14.5 % (ref 11.5–15.5)
WBC: 8 10*3/uL (ref 4.0–10.5)
nRBC: 0 % (ref 0.0–0.2)

## 2023-01-22 LAB — CREATININE, SERUM
Creatinine, Ser: 0.87 mg/dL (ref 0.44–1.00)
GFR, Estimated: >60 mL/min (ref >60)

## 2023-01-22 LAB — GLUCOSE, CAPILLARY: Glucose-Capillary: 84 mg/dL (ref 70–99)

## 2023-01-22 MED ORDER — ENOXAPARIN SODIUM 40 MG/0.4ML IJ SOSY
40.0000 mg | PREFILLED_SYRINGE | INTRAMUSCULAR | Status: DC
  Administered 2023-01-23 – 2023-01-26 (×4): 40 mg via SUBCUTANEOUS
  Filled 2023-01-22 (×4): qty 0.4

## 2023-01-22 MED ORDER — FENTANYL CITRATE PF 50 MCG/ML IJ SOSY: 25.0000 ug | PREFILLED_SYRINGE | INTRAMUSCULAR | Status: DC | PRN

## 2023-01-22 MED ORDER — HYDRALAZINE HCL 10 MG PO TABS
10.0000 mg | ORAL_TABLET | Freq: Three times a day (TID) | ORAL | Status: DC
  Administered 2023-01-22 – 2023-01-23 (×2): 10 mg via ORAL
  Filled 2023-01-22 (×3): qty 1

## 2023-01-22 MED ORDER — OXYCODONE HCL 5 MG PO TABS: 2.5000 mg | ORAL_TABLET | ORAL | Status: DC | PRN

## 2023-01-22 NOTE — Plan of Care (Signed)

## 2023-01-22 NOTE — Progress Notes (Signed)
Physical Therapy Treatment Patient Details Name: Caroline Zamora MRN: [id number] DOB: [birth year] Today's Date: 01/22/2023   History of Present Illness 82 y.o. female with medical history significant of hypertension, hyperlipidemia, diastolic CHF, hypothyroidism, depression with anxiety, CKD 3A, PAF on Eliquis, colon cancer (s/p of surgery), orthostatic hypotension, who presents with frequent fall and back pain and was found to have T12 burst fracture, s/p T10-L2 decompression and fusion 2/16; post-op delirium. PMH: Afib, HTN, CKD, depression, migraines, and arthritis.    PT Comments    Pt resting in bed upon PT arrival; pt refusing any mobility (even logrolling in bed) d/t concerns of causing pain but was agreeable to LE ex's in bed.  Pt tolerated LE ex's fairly well with minimal pain; cognition appearing to be improving.  Educated pt on importance of mobility (pt in agreement but just did not feel up to it today).  Will attempt progression of mobility next session as able (anticipate pt will need to be pre-medicated with pain meds).   Recommendations for follow up therapy are one component of a multi-disciplinary discharge planning process, led by the attending physician.  Recommendations may be updated based on patient status, additional functional criteria and insurance authorization.  Follow Up Recommendations  Skilled nursing-short term rehab (<3 hours/day)     Assistance Recommended at Discharge Frequent or constant Supervision/Assistance  Patient can return home with the following A lot of help with walking and/or transfers;A lot of help with bathing/dressing/bathroom;Assistance with cooking/housework;Help with stairs or ramp for entrance;Assist for transportation   Equipment Recommendations   (TBD pending progress)    Recommendations for Other Services       Precautions / Restrictions Precautions Precautions: Back Required Braces or Orthoses: Spinal Brace Spinal Brace:  Thoracolumbosacral orthotic;Applied in sitting position Restrictions Weight Bearing Restrictions: No     Mobility  Bed Mobility               General bed mobility comments: pt declined any bed mobilityd/t concerns of causing pain    Transfers                        Ambulation/Gait                   Stairs             Wheelchair Mobility    Modified Rankin (Stroke Patients Only)       Balance                                            Cognition Arousal/Alertness: Awake/alert Behavior During Therapy: WFL for tasks assessed/performed Overall Cognitive Status: Impaired/Different from baseline                                 General Comments: Oriented to self and general situation        Exercises General Exercises - Lower Extremity Ankle Circles/Pumps: AROM, Strengthening, Both, 10 reps, Supine Quad Sets: AROM, Strengthening, Both, 10 reps, Supine Short Arc Quad: AROM, Strengthening, Both, 10 reps, Supine Heel Slides: AROM, Strengthening, Both, 10 reps, Supine Hip ABduction/ADduction: AROM, Strengthening, Both, 10 reps, Supine    General Comments  Nursing cleared pt for participation in physical therapy.  Pt agreeable to PT session.      Pertinent Vitals/Pain  Pain Assessment Pain Assessment: 0-10 Faces Pain Scale: Hurts a little bit Pain Location: low back Pain Descriptors / Indicators: Discomfort Pain Intervention(s): Limited activity within patient's tolerance, Monitored during session, Repositioned, Other (comment) (RN present to give Tylenol) Vitals (HR and O2 on room air) stable and WFL throughout treatment session.    Home Living                          Prior Function            PT Goals (current goals can now be found in the care plan section) Acute Rehab PT Goals Patient Stated Goal: pt hopes to go home PT Goal Formulation: With patient/family Potential to Achieve  Goals: Fair Progress towards PT goals: Progressing toward goals (with LE ex's)    Frequency    Min 2X/week      PT Plan Current plan remains appropriate    Co-evaluation              AM-PAC PT "6 Clicks" Mobility   Outcome Measure  Help needed turning from your back to your side while in a flat bed without using bedrails?: Total Help needed moving from lying on your back to sitting on the side of a flat bed without using bedrails?: Total Help needed moving to and from a bed to a chair (including a wheelchair)?: Total Help needed standing up from a chair using your arms (e.g., wheelchair or bedside chair)?: Total Help needed to walk in hospital room?: Total Help needed climbing 3-5 steps with a railing? : Total 6 Click Score: 6    End of Session   Activity Tolerance: Patient tolerated treatment well Patient left: in bed;with call bell/phone within reach;with bed alarm set;with nursing/sitter in room;with SCD's reapplied Nurse Communication: Mobility status;Precautions PT Visit Diagnosis: Muscle weakness (generalized) (M62.81);Difficulty in walking, not elsewhere classified (R26.2);Pain;Unsteadiness on feet (R26.81)     Time: [id number] PT Time Calculation (min) (ACUTE ONLY): 20 min  Charges:  $Therapeutic Exercise: 8-22 mins                     Leitha Bleak, PT 01/22/23, 5:34 PM

## 2023-01-22 NOTE — TOC Progression Note (Signed)
Transition of Care [facility]) - Progression Note    Patient Details  Name: Caroline Zamora MRN: [id number] Date of Birth: 1941-10-31  [facility] CM/SW Contact  Laurena Slimmer, RN Phone Number: 01/22/2023, 1:03 PM  Clinical Narrative:    Case reviewed for needs and disposition. Therapy rec for SNF. Patient has 1:1 sitter.   Expected Discharge Plan: [place] Barriers to Discharge: Continued Medical Work up  Expected Discharge Plan and Services   Discharge Planning Services: CM Consult   Living arrangements for the past 2 months: Single Family Home                 DME Arranged: N/A                     Social Determinants of Health (SDOH) Interventions SDOH Screenings   Food Insecurity: No Food Insecurity (01/21/2023)  Housing: Low Risk  (01/21/2023)  Transportation Needs: No Transportation Needs (01/21/2023)  Utilities: Not At Risk (01/21/2023)  Tobacco Use: Low Risk  (01/19/2023)    Readmission Risk Interventions     No data to display

## 2023-01-22 NOTE — Progress Notes (Signed)
Pt BP was 112/54 heart rate 107. Per MD hold hydralazine.

## 2023-01-22 NOTE — Evaluation (Signed)
Occupational Therapy Evaluation Patient Details Name: Caroline Zamora MRN: [id number] DOB: 03-29-1941 Today's Date: 01/22/2023   History of Present Illness 82 y.o. female with medical history significant of hypertension, hyperlipidemia, diastolic CHF, hypothyroidism, depression with anxiety, CKD 3A, PAF on Eliquis, colon cancer (s/p of surgery), orthostatic hypotension, who presents with frequent fall and back pain and was found to have T12 burst fracture, s/p T10-L2 decompression and fusion 2/16; post-op delirium. PMH: Afib, HTN, CKD, depression, migraines, and arthritis.   Clinical Impression   Patient presenting with decreased Ind in self care,balance, functional mobility/transfers, endurance, and safety awareness. Patient lives at home alone and has had some help from family for IADLs. Multiple falls at home prior to admission.Patient opens eyes when you use her name but if not engaged she has eyes closed and drifting back to sleep throughout. Pt needing total A for hand over hand assistance to wash face. Pt currently functioning at max A to roll to the R and is in obvious pain with movement. She does initiate movement with cuing and increased time. Patient will benefit from acute OT to increase overall independence in the areas of ADLs, functional mobility, and safety awareness in order to safely discharge to next venue of care.      Recommendations for follow up therapy are one component of a multi-disciplinary discharge planning process, led by the attending physician.  Recommendations may be updated based on patient status, additional functional criteria and insurance authorization.   Follow Up Recommendations  Skilled nursing-short term rehab (<3 hours/day)     Assistance Recommended at Discharge Frequent or constant Supervision/Assistance  Patient can return home with the following Two people to help with walking and/or transfers;Two people to help with  bathing/dressing/bathroom;Assistance with cooking/housework;Assist for transportation;Help with stairs or ramp for entrance    Functional Status Assessment  Patient has had a recent decline in their functional status and demonstrates the ability to make significant improvements in function in a reasonable and predictable amount of time.  Equipment Recommendations  Other (comment) (defer to next venue of care)       Precautions / Restrictions Precautions Precautions: Back Required Braces or Orthoses: Spinal Brace Spinal Brace: Thoracolumbosacral orthotic;Applied in sitting position Restrictions Weight Bearing Restrictions: No      Mobility Bed Mobility Overal bed mobility: Needs Assistance Bed Mobility: Rolling Rolling: Max assist         General bed mobility comments: Pt does initiate roll and reaches for bed rail    Transfers                   General transfer comment: inappropriate to attempt this date          ADL either performed or assessed with clinical judgement   ADL Overall ADL's : Needs assistance/impaired                                       General ADL Comments: Pt needing total hand over hand assistance to wash face in bed. Pt would likely need max - total A for self care.     Vision Patient Visual Report: No change from baseline              Pertinent Vitals/Pain Pain Assessment Pain Assessment: Faces Faces Pain Scale: Hurts whole lot Pain Location: generalized with bed mobility Pain Descriptors / Indicators: Aching, Discomfort Pain Intervention(s): Premedicated before session, Repositioned,  Monitored during session     Hand Dominance Right   Extremity/Trunk Assessment Upper Extremity Assessment Upper Extremity Assessment: Difficult to assess due to impaired cognition;Generalized weakness   Lower Extremity Assessment Lower Extremity Assessment: Difficult to assess due to impaired cognition;Generalized  weakness       Communication Communication Communication: Other (comment) (lethargic)   Cognition Arousal/Alertness: Lethargic Behavior During Therapy: Restless Overall Cognitive Status: Impaired/Different from baseline                                 General Comments: Pt oriented to self only. Lethargic with eyes closed off and on throughout session                [place] expects to be discharged to:: Private residence Living Arrangements: Alone Available Help at Discharge: Family;Available PRN/intermittently Type of Home: House Home Access: Stairs to enter [employer] of Steps: 4 (B railings from back entrance; L railing from front entrance)   Home Layout: Two level;1/2 bath on main level;Bed/bath upstairs Alternate Level Stairs-Number of Steps: flight of steps Alternate Level Stairs-Rails: Left Bathroom Shower/Tub: Tub/shower unit;Walk-in shower         Home Equipment: Standard Walker;Cane - single point;Crutches;Rolling Walker (2 wheels)   Additional Comments: H/o ankle fx and R LE fx in November 2022.      Prior Functioning/Environment Prior Level of Function : Independent/Modified Independent;History of Falls (last six months);Driving             Mobility Comments: typically does not need AD, recently using walker, numerous falls in past 6 months due to syncope ADLs Comments: has been requiring assist for IADL from son and his wife, mod I for ADL        OT Problem List: Decreased strength;Pain;Decreased activity tolerance;Decreased safety awareness;Impaired balance (sitting and/or standing);Decreased knowledge of use of DME or AE;Decreased cognition      OT Treatment/Interventions: Self-care/ADL training;Therapeutic exercise;Therapeutic activities;DME and/or AE instruction;Patient/family education;Manual therapy;Balance training;Cognitive remediation/compensation;Energy conservation    OT Goals(Current goals  can be found in the care plan section) Acute Rehab OT Goals Patient Stated Goal: decrease pain OT Goal Formulation: With patient Time For Goal Achievement: 02/05/23 Potential to Achieve Goals: Good ADL Goals Pt Will Perform Grooming: with min assist Pt Will Perform Lower Body Dressing: with min assist Pt Will Transfer to Toilet: with min assist Pt Will Perform Toileting - Clothing Manipulation and hygiene: with min assist  OT Frequency: Min 2X/week       AM-PAC OT "6 Clicks" Daily Activity     Outcome Measure Help from another person eating meals?: A Lot Help from another person taking care of personal grooming?: A Lot Help from another person toileting, which includes using toliet, bedpan, or urinal?: Total Help from another person bathing (including washing, rinsing, drying)?: Total Help from another person to put on and taking off regular upper body clothing?: A Lot Help from another person to put on and taking off regular lower body clothing?: Total 6 Click Score: 9   End of Session Nurse Communication: Mobility status  Activity Tolerance: Patient limited by pain;Patient limited by lethargy Patient left: in bed;with nursing/sitter in room;with call bell/phone within reach;with bed alarm set  OT Visit Diagnosis: Pain;Muscle weakness (generalized) (M62.81);Unsteadiness on feet (R26.81) Pain - part of body:  (back)                Time: [id number] OT Time Calculation (  min): 12 min Charges:  OT General Charges $OT Visit: 1 Visit OT Evaluation $OT Eval Moderate Complexity: 1 [address], [place], OTR/L , [facility]IS ascom [phone]  01/22/23, 12:37 PM

## 2023-01-22 NOTE — Consult Note (Signed)
Consultation Note Date: 01/22/2023   Patient Name: Caroline Zamora  DOB: 18-Apr-1941  MRN: [id number]  Age / Sex: 82 y.o., female  PCP: Kirk Ruths, MD Referring Physician: Max Sane, MD  Reason for Consultation: Establishing goals of care and Psychosocial/spiritual support  HPI/Patient Profile: 82 y.o. female     82 y.o. female with medical history significant of hypertension, hyperlipidemia, diastolic CHF, hypothyroidism, depression with anxiety, CKD 3A, PAF on Eliquis, colon cancer (s/p of surgery), orthostatic hypotension, who presents with frequent fall and back pain and was found to have T12 burst fracture   2/13: Neurosurgery consult 2/14: Worsening of T12 burst fracture which is unstable and now requiring surgery which is scheduled for Friday by neurosurgery 2/15: Transfer to stepdown and intensivist consult.  Dilaudid pump started, Lyrica 25 mg 3 times daily and 1 dose of Decadron 2/16: T10-L2 posterior fusion 2/17: Precedex started, psych consult 2/18: Off Precedex.  Started Seroquel and Depakote for mood.  Transfer to floor 2/19: Remains with one-on-one sitter, resting more comfortably today   Family face treatment option decisions, advanced directive decisions and anticipatory care needs.   Clinical Assessment and Goals of Care:  This NP Wadie Lessen reviewed medical records, received report from team, assessed the patient and then spoke to the patient's son/Steven Lovena Le  to discuss diagnosis, prognosis, GOC, disposition and options.   Concept of Palliative Care was introduced as specialized medical care for people and their families living with serious illness.  If focuses on providing relief from the symptoms and stress of a serious illness.  The goal is to improve quality of life for both the patient and the family.Values and goals of care important to patient and family were  attempted to be elicited.  Anselm Pancoast appreciation for today's phone call and welcomes updates.    On exploration today,  son clearly verbalizes that his mother had no cognitive deficits or psychiatric history prior to admission.  Psychiatry continues to follow and make recommendation for delirium.  Today seems to be a better day for the patient we will hope for continued improvement.   Education offered today regarding  the importance of continued conversation with family and their  medical providers regarding overall plan of care and treatment options,  ensuring decisions are within the context of the patients values and GOCs.  Decisions regarding treatment plan will be made on patient outcomes over the next several days.    Documented H POA, son Sisira Zunker   SUMMARY OF RECOMMENDATIONS    Code Status/Advance Care Planning: Full code   Symptom Management:  Pain: In all 1000 mg p.o. every 6 hours scheduled  Palliative Prophylaxis:  Aspiration, Delirium Protocol, Frequent Pain Assessment, and Oral Care  Additional Recommendations (Limitations, Scope, Preferences): Full Scope Treatment  Psycho-social/Spiritual:  Desire for further Chaplaincy support:no Emotional support   Prognosis:  Unable to determine  Discharge Planning: To Be Determined      Primary Diagnoses: Present on Admission:  Intractable back pain  Closed unstable burst fracture  of T12 vertebra (HCC)  Atrial fibrillation, chronic (HCC)  HTN (hypertension)  Chronic kidney disease, stage 3a (HCC)  Hypokalemia  Chronic diastolic CHF (congestive heart failure) ([place])  Depression with anxiety  Hypophosphatemia  Hypothyroidism  Orthostatic hypotension   I have reviewed the medical record, interviewed the patient and family, and examined the patient. The following aspects are pertinent.  Past Medical History:  Diagnosis Date   Arthritis    lumbar spine   CKD (chronic kidney disease), stage  II    Colon cancer (HCC)    Depression    Dysphagia    Headache    migraine   Hx of adenomatous colonic polyps    Hyperlipidemia    Hypertension    PAF (paroxysmal atrial fibrillation) (HCC)    Paroxysmal atrial fibrillation (HCC)    Skin cancer    Syncope    Type 2 diabetes mellitus ([place]) 12/11/2022   Social History   Socioeconomic History   Marital status: Widowed    Spouse name: Not on file   Number of children: Not on file   Years of education: Not on file   Highest education level: Not on file  Occupational History   Not on file  Tobacco Use   Smoking status: Never   Smokeless tobacco: Never  Vaping Use   Vaping Use: Never used  Substance and Sexual Activity   Alcohol use: Not Currently    Comment: a couple of glasses of wine per day   Drug use: No   Sexual activity: Not on file  Other Topics Concern   Not on file  Social History Narrative   Not on file   Social Determinants of Health   Financial Resource Strain: Not on file  Food Insecurity: No Food Insecurity (01/21/2023)   Hunger Vital Sign    Worried About Running Out of Food in the Last Year: Never true    Ran Out of Food in the Last Year: Never true  Transportation Needs: No Transportation Needs (01/21/2023)   PRAPARE - Hydrologist (Medical): No    Lack of Transportation (Non-Medical): No  Physical Activity: Not on file  Stress: Not on file  Social Connections: Not on file   Family History  Problem Relation Age of Onset   Diabetes Mellitus II Mother    Atrial fibrillation Father    Breast cancer Neg Hx    Scheduled Meds:  acetaminophen  1,000 mg Oral Q6H   amiodarone  200 mg Oral Daily   Chlorhexidine Gluconate Cloth  6 each Topical Daily   divalproex  250 mg Oral Q12H   estradiol  0.5 mg Oral QHS   hydrALAZINE  25 mg Oral Q8H   levothyroxine  50 mcg Oral QAC breakfast   lidocaine  1 patch Transdermal Q24H   methocarbamol  500 mg Oral QID   mupirocin ointment   1 Application Nasal BID   QUEtiapine  50 mg Oral TID   sodium chloride flush  10-40 mL Intracatheter Q12H   sodium chloride flush  3 mL Intravenous Q12H   venlafaxine XR  150 mg Oral Daily   Continuous Infusions:  sodium chloride Stopped (01/19/23 1815)   sodium chloride 75 mL/hr at 01/22/23 0412   PRN Meds:.diphenhydrAMINE, fentaNYL (SUBLIMAZE) injection, hydrALAZINE, magnesium hydroxide, melatonin, menthol-cetylpyridinium **OR** phenol, oxyCODONE, sodium chloride flush, sodium chloride flush Medications Prior to Admission:  Prior to Admission medications   Medication Sig Start Date End Date Taking? Authorizing Provider  acetaminophen (  TYLENOL) 500 MG tablet Take 500 mg by mouth every 6 (six) hours as needed for mild pain.   Yes [provider]  amiodarone (PACERONE) 200 MG tablet Take 200 mg by mouth daily.   Yes [provider]  apixaban (ELIQUIS) 5 MG TABS tablet Take 5 mg by mouth 2 (two) times daily.   Yes [provider]  estradiol (ESTRACE) 0.5 MG tablet Take 0.5 mg by mouth at bedtime.   Yes [provider]  fludrocortisone (FLORINEF) 0.1 MG tablet Take 0.1 mg by mouth daily.   Yes [provider]  levothyroxine (SYNTHROID) 50 MCG tablet Take 50 mcg by mouth daily before breakfast.   Yes [provider]  venlafaxine XR (EFFEXOR-XR) 150 MG 24 hr capsule Take 150 mg by mouth daily. 01/20/22  Yes [provider]  pregabalin (LYRICA) 50 MG capsule Take 50 mg by mouth 2 (two) times daily. Patient not taking: Reported on 01/16/2023    [provider]   Allergies  Allergen Reactions   Desyrel [Trazodone] Rash   Sulfa Antibiotics Rash   Review of Systems  Unable to perform ROS: Mental status change    Physical Exam Constitutional:      Comments: Patient resting, sitter at bedside I  did not disturb     Vital Signs: BP 115/84 (BP Location: Left Arm)   Pulse 100   Temp 97.9 F (36.6 C) (Oral)   Resp 18    Ht '5\' 6"'$  (1.676 m)   Wt 81.6 kg   LMP  (LMP Unknown)   SpO2 96%   BMI 29.05 kg/m  Pain Scale: 0-10 POSS *See Group Information*: S-Acceptable,Sleep, easy to arouse Pain Score: 7    SpO2: SpO2: 96 % O2 Device:SpO2: 96 % O2 Flow Rate: .O2 Flow Rate (L/min): 2 L/min  IO: Intake/output summary:  Intake/Output Summary (Last 24 hours) at 01/22/2023 0848 Last data filed at 01/22/2023 [id number] Gross per 24 hour  Intake 550.26 ml  Output 995 ml  Net -444.74 ml    LBM: Last BM Date :  (PTA) Baseline Weight: Weight: 81.6 kg Most recent weight: Weight: 81.6 kg     Palliative Assessment/Data: 30 % at best   Discussed with Dr Manuella Ghazi and bedside RN   Time In: 1100 Time Out: 1215 Time Total: 75 minutes Greater than 50%  of this time was spent counseling and coordinating care related to the above assessment and plan.  Signed by: Wadie Lessen, NP   Please contact Palliative Medicine Team phone at [phone] for questions and concerns.  For individual provider: See Shea Evans

## 2023-01-22 NOTE — Progress Notes (Signed)
    Attending Progress Note  History: Caroline Zamora is here for T12 fracture after multiple falls.  POD3: Doing much better.  She had significant delirium over the weekend.  She is now doing much better.  She unfortunately did decline physical therapy today.  HD3: Pt reports continued severe back pain with significant pain in her legs, particularly her right thigh  HD2: During her bath today, the patient developed worsening pain in her back.  She is now unable to achieve any sort of pain relief when she moves.  She also has some cramping in her lower extremities that was not previously present.  She is able to feel her perineum and has control over her bowels and bladder function.  That being said, she has some cramping that was not previously present.  Her pain is quite different than it was prior to this event today.  Physical Exam: Vitals:   01/22/23 0808 01/22/23 1400  BP: 115/84 (!) 112/54  Pulse: 100 (!) 107  Resp: 18 18  Temp: 97.9 F (36.6 C) 97.8 F (36.6 C)  SpO2: 96% 95%    AA Ox3 CNI  Strength:MAEW 5/5  Data:  Other tests/results:  Xrays 01/17/2023 IMPRESSION: 1. Unchanged severe T12 burst fracture.     Electronically Signed   By: Titus Dubin M.D.   On: 01/17/2023 15:21  Please note that the comparison study for this x-ray was her MRI scan from yesterday.  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  On her standing x-rays today, that kyphosis has increased to 30 degrees.  MRI TL spine 01/16/2023 IMPRESSION: 1. Subacute T12 body fracture with advanced height loss that is progressed from 12/11/2022 CT. Also progressed is retropulsion which causes lower cord compression. 2. Chronic/degenerative changes as described.     Electronically Signed   By: Jorje Guild M.D.   On: 01/16/2023 05:39    Assessment/Plan:  Caroline Zamora is doing much  better after her delirium has improved.  She is doing well after T10-L2 intervention for unstable T12 burst fracture, thoracic stenosis, and thoracic kyphosis.  - pain control - will deescalate pain meds - PTOT - must work with PTOT tomorrow to begin mobilizing - OK to wear brace OOB as needed - Will likely dc drain tomorrow - Eliquis ok to restart on 02/02/2023.     Meade Maw MD Department of Neurosurgery

## 2023-01-22 NOTE — Progress Notes (Signed)
Progress Note   Patient: Caroline Zamora [id number] DOB: 09/20/1941 DOA: 01/16/2023     6 DOS: the patient was seen and examined on 01/22/2023   Brief hospital course: 82 y.o. female with medical history significant of hypertension, hyperlipidemia, diastolic CHF, hypothyroidism, depression with anxiety, CKD 3A, PAF on Eliquis, colon cancer (s/p of surgery), orthostatic hypotension, who presents with frequent fall and back pain and was found to have T12 burst fracture  2/13: Neurosurgery consult 2/14: Worsening of T12 burst fracture which is unstable and now requiring surgery which is scheduled for Friday by neurosurgery 2/15: Transfer to stepdown and intensivist consult.  Dilaudid pump started, Lyrica 25 mg 3 times daily and 1 dose of Decadron 2/16: T10-L2 posterior fusion 2/17: Precedex started, psych consult 2/18: Off Precedex.  Started Seroquel and Depakote for mood.  Transfer to floor 2/19: Less agitated, still has one-to-one sitter as she requires close monitoring for her behavioral and safety  Assessment and Plan:  Acute metabolic encephalopathy Acute delirium Has safety sitter at bedside Continue Depakote, Seroquel and adjust as needed.  Could consider low-dose Risperdal   Intractable back pain due to closed fracture of T12 vertebra: MRI showed subacute T12 body fracture with advanced height loss that is progressed from 12/11/2022 CT. Also progressed is retropulsion which causes lower cord compression.  Repeat x-ray on 2/14 showed worsening of T12 burst fracture with evidence of instability with her thoracic kyphosis now measuring 30 degree versus 18 degree on initial CT scan performed last month S/p T10-L2 posterior fusion on 2/16 -On fentanyl (I have reduced it from 50 to 25 mcg today) and oxycodone for pain control  Hypokalemia: Replete and recheck, check magnesium   Fall at home, initial encounter -Fall precaution -PT/OT recommends SNF, TOC consult for SNF  placement   Atrial fibrillation, chronic (HCC) -Continue amiodarone for rate control -Hold off Eliquis for total 14 days from surgery considering neurosurgery intervention   HTN (hypertension): Continue oral hydralazine for better blood pressure control -IV hydralazine as needed   Chronic kidney disease, stage 3a ([place]): Stable -Monitor BMP   Hypokalemia and hypophosphatemia: Repleted and resolved   Chronic diastolic CHF (congestive heart failure) ([place]): 2D echo 12/12/2022 showed EF of 55-60% with grade 1 diastolic dysfunction.  Patient does not have leg edema or JVD.  BNP normal 30.6.  CHF is compensated. -Watch volume status closely   Depression with anxiety -Continue Effexor.    Hypothyroidism: -Continue Synthroid   Orthostatic hypotension: -Florinef stopped per her outpatient cardiology Dr. Virl Axe request.  He sent me a secure chat yesterday so I have stopped it.  Difficulty with IV access -She had a very poor access and finally IV team was able to establish midline yesterday     Subjective: More calm today.  Safety sitter at bedside  Physical Exam: Vitals:   01/21/23 1500 01/22/23 0021 01/22/23 0437 01/22/23 0808  BP: (!) 142/56 (!) 141/67 (!) 140/68 115/84  Pulse: 98 72 (!) 104 100  Resp: 20 18 18 18  $ Temp: 98.2 F (36.8 C) 97.6 F (36.4 C) 97.9 F (36.6 C) 97.9 F (36.6 C)  TempSrc:    Oral  SpO2: 97% 98% 96% 96%  Weight:      Height:       General: 82 year old female lying in the bed comfortably and more calm today Cardiac: S1/S2, RRR, No murmurs, No gallops or rubs. Respiratory: No rales, wheezing, rhonchi or rubs. GI: Soft, benign Ext: No pitting leg edema bilaterally.  Skin:  No rashes.  Drain in place from surgical site Neuro: Nonfocal, sleepy but wakes up to verbal commands, open eyes briefly and falls back to sleep Psych: Intermittent agitation Data Reviewed:  There are no new results to review at this time.  Family Communication: Son  updated over phone  Disposition: Status is: Inpatient Remains inpatient appropriate because: Acute delirium  Planned Discharge Destination: Skilled nursing facility   DVT prophylaxis-SCDs Time spent: 35 minutes  Author: Max Sane, MD 01/22/2023 12:44 PM  For on call review [email or web address].

## 2023-01-23 DIAGNOSIS — F418 Other specified anxiety disorders: Secondary | ICD-10-CM | POA: Diagnosis not present

## 2023-01-23 DIAGNOSIS — S22082S Unstable burst fracture of T11-T12 vertebra, sequela: Secondary | ICD-10-CM | POA: Diagnosis not present

## 2023-01-23 DIAGNOSIS — M549 Dorsalgia, unspecified: Secondary | ICD-10-CM | POA: Diagnosis not present

## 2023-01-23 DIAGNOSIS — R410 Disorientation, unspecified: Secondary | ICD-10-CM | POA: Diagnosis not present

## 2023-01-23 LAB — GLUCOSE, CAPILLARY: Glucose-Capillary: 125 mg/dL — ABNORMAL HIGH (ref 70–99)

## 2023-01-23 MED ORDER — IBUPROFEN 400 MG PO TABS: 400.0000 mg | ORAL_TABLET | Freq: Four times a day (QID) | ORAL | Status: DC | PRN

## 2023-01-23 MED ORDER — IBUPROFEN 400 MG PO TABS: 400.0000 mg | ORAL_TABLET | Freq: Three times a day (TID) | ORAL | Status: DC

## 2023-01-23 MED ORDER — METHOCARBAMOL 500 MG PO TABS
500.0000 mg | ORAL_TABLET | Freq: Four times a day (QID) | ORAL | Status: DC | PRN
  Administered 2023-01-23 – 2023-01-26 (×2): 500 mg via ORAL
  Filled 2023-01-23 (×2): qty 1

## 2023-01-23 MED ORDER — QUETIAPINE FUMARATE 25 MG PO TABS: 50.0000 mg | ORAL_TABLET | Freq: Two times a day (BID) | ORAL | Status: DC

## 2023-01-23 NOTE — Progress Notes (Signed)
    Attending Progress Note  History: SHAQUIDA LEMAR is here for T12 fracture after multiple falls.  POD4: pt is drowsy this morning but reports reasonable pain control.  POD3: Doing much better.  She had significant delirium over the weekend.  She is now doing much better.  She unfortunately did decline physical therapy today.  HD3: Pt reports continued severe back pain with significant pain in her legs, particularly her right thigh  HD2: During her bath today, the patient developed worsening pain in her back.  She is now unable to achieve any sort of pain relief when she moves.  She also has some cramping in her lower extremities that was not previously present.  She is able to feel her perineum and has control over her bowels and bladder function.  That being said, she has some cramping that was not previously present.  Her pain is quite different than it was prior to this event today.  Physical Exam: Vitals:   01/23/23 0334 01/23/23 0821  BP: (!) 116/42 (!) 96/48  Pulse: 76 79  Resp: 20 15  Temp: 97.6 F (36.4 C) 98.2 F (36.8 C)  SpO2: 95% 97%    Drowsy but arouses to voice. Falls back asleep during conversation CNI  Strength: MAEW Incision: covered with incisional wound vac. HV drain output not recorded overnight. There appears to be 30 mL in drain.  Data:  Other tests/results:  Xrays 01/17/2023 IMPRESSION: 1. Unchanged severe T12 burst fracture.     Electronically Signed   By: Titus Dubin M.D.   On: 01/17/2023 15:21  Please note that the comparison study for this x-ray was her MRI scan from yesterday.  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  On her standing x-rays today, that kyphosis has increased to 30 degrees.  MRI TL spine 01/16/2023 IMPRESSION: 1. Subacute T12 body fracture with advanced height loss that is progressed from 12/11/2022 CT. Also  progressed is retropulsion which causes lower cord compression. 2. Chronic/degenerative changes as described.     Electronically Signed   By: Jorje Guild M.D.   On: 01/16/2023 05:39    Assessment/Plan:  LAPORSCHE ESPINO is doing much better after her delirium has improved.  She is doing well after T10-L2 intervention for unstable T12 burst fracture, thoracic stenosis, and thoracic kyphosis.  - pain control: recommend continuing to deescalate pain medication given patients cognitive status. - PTOT; reiterated to patient the importance of working with therapy - OK to wear brace OOB as needed - Please record HV output. Will likely remove drain and wound vac 0n 2/21 - Eliquis ok to restart on 02/02/2023.   Cooper Render PA-C Department of Neurosurgery

## 2023-01-23 NOTE — Progress Notes (Signed)
Progress Note   Patient: LAKITHA STROMME [id number] DOB: [birth year] DOA: 01/16/2023     7 DOS: the patient was seen and examined on 01/23/2023   Brief hospital course: 82 y.o. female with medical history significant of hypertension, hyperlipidemia, diastolic CHF, hypothyroidism, depression with anxiety, CKD 3A, PAF on Eliquis, colon cancer (s/p of surgery), orthostatic hypotension, who presents with frequent fall and back pain and was found to have T12 burst fracture  2/13: Neurosurgery consult 2/14: Worsening of T12 burst fracture which is unstable and now requiring surgery which is scheduled for Friday by neurosurgery 2/15: Transfer to stepdown and intensivist consult.  Dilaudid pump started, Lyrica 25 mg 3 times daily and 1 dose of Decadron 2/16: T10-L2 posterior fusion 2/17: Precedex started, psych consult 2/18: Off Precedex.  Started Seroquel and Depakote for mood.  Transfer to floor 2/19: Less agitated, still has one-to-one sitter as she requires close monitoring for her behavioral and safety 2/20: Her mental status is back to baseline.  Delirium seems to have resolved.  Stopped all narcotics and also discontinued Seroquel  Assessment and Plan:  Acute metabolic encephalopathy Acute delirium Now resolved.  Discontinue all narcotics for now Continue Depakote.  Will stop Seroquel for now.     Intractable back pain due to closed fracture of T12 vertebra: MRI showed subacute T12 body fracture with advanced height loss that is progressed from 12/11/2022 CT. Also progressed is retropulsion which causes lower cord compression.  Repeat x-ray on 2/14 showed worsening of T12 burst fracture with evidence of instability with her thoracic kyphosis now measuring 30 degree versus 18 degree on initial CT scan performed last month S/p T10-L2 posterior fusion on 2/16.  She has HV drain scheduled to be removed tomorrow per neurosurgery and will likely have wound VAC -Her pain is well-controlled  for now -All narcotics discontinued.  Will start her on ibuprofen 400 mg p.o. 3 times daily as needed for now   Hypokalemia and hypophosphatemia: Repleted and resolved   Fall at home, initial encounter -Fall precaution -PT/OT recommends SNF, TOC consult for SNF placement.  Son is hopeful to take her home as he is concerned about delirium in any new facility although trying to see if he could find caregivers to keep her home   Atrial fibrillation, chronic (HCC) -Continue amiodarone for rate control -Hold off Eliquis for total 14 days from surgery considering neurosurgery intervention.  Can be resumed on 02/02/2023 per neurosurgery   HTN (hypertension): Blood pressure running lower now.  Will stop scheduled hydralazine including as needed hydralazine   Chronic kidney disease, stage 3a [facility]): Stable   Chronic diastolic CHF (congestive heart failure) ([place]): 2D echo 12/12/2022 showed EF of 55-60% with grade 1 diastolic dysfunction.  Patient does not have leg edema or JVD.  BNP normal 30.6.  CHF is compensated.   Depression with anxiety -Continue Effexor.    Hypothyroidism: -Continue Synthroid   Orthostatic hypotension: -Florinef stopped per her outpatient cardiology Dr. Virl Axe request.  He sent me a secure chat yesterday so I have stopped it.   Difficulty with IV access -She had a very poor access and finally IV team was able to establish midline on 2/18     Subjective: Doing much better.  She is appreciative of her care here and very apologetic and embarrassed about her behavior  Physical Exam: Vitals:   01/22/23 2238 01/23/23 0334 01/23/23 0821 01/23/23 1143  BP: (!) 155/62 (!) 116/42 (!) 96/48 (!) 109/49  Pulse: 84 76 79  80  Resp: 20 20 15 15  $ Temp: 98.1 F (36.7 C) 97.6 F (36.4 C) 98.2 F (36.8 C) 98.6 F (37 C)  TempSrc:   Oral   SpO2: 98% 95% 97% 96%  Weight:      Height:       General: 82 year old female lying in the bed comfortably and more calm  today Cardiac: S1/S2, RRR, No murmurs, No gallops or rubs. Respiratory: No rales, wheezing, rhonchi or rubs. GI: Soft, benign Ext: No pitting leg edema bilaterally.  Skin: No rashes.  HV drain in place with roughly 30 cc collection Neuro: Awake and alert, nonfocal Psych: Normal mood and affect Data Reviewed:  There are no new results to review at this time.  Family Communication: Son updated over phone  Disposition: Status is: Inpatient Remains inpatient appropriate because: HV drain scheduled, tomorrow per neurosurgery.  Disposition decision based on patient/family wishes.  PT recommends SNF  Planned Discharge Destination: Skilled nursing facility   DVT prophylaxis-Lovenox Time spent: 35 minutes  Author: Max Sane, MD 01/23/2023 1:34 PM  For on call review [email or web address].

## 2023-01-23 NOTE — Plan of Care (Signed)
  Problem: Nutrition: Goal: Adequate nutrition will be maintained Outcome: Progressing   Problem: Pain Managment: Goal: General experience of comfort will improve Outcome: Progressing   Problem: Safety: Goal: Ability to remain free from injury will improve Outcome: Progressing   

## 2023-01-23 NOTE — Progress Notes (Signed)
Physical Therapy Treatment Patient Details Name: Caroline Zamora MRN: [id number] DOB: October 03, 1941 Today's Date: 01/23/2023   History of Present Illness 82 y.o. female with medical history significant of hypertension, hyperlipidemia, diastolic CHF, hypothyroidism, depression with anxiety, CKD 3A, PAF on Eliquis, colon cancer (s/p of surgery), orthostatic hypotension, who presents with frequent fall and back pain and was found to have T12 burst fracture, s/p T10-L2 decompression and fusion 2/16; post-op delirium. PMH: Afib, HTN, CKD, depression, migraines, and arthritis.    PT Comments    Pt was supine in bed upon arriving. She is alert and agreeable to session. Per RN staff, cognition is improved but Pryor Curia still observed some cognition concerns during session. Poor awareness with slow processing. She was able to consistently follow commands with increased time. Pt was able to exit L side of bed via log roll technique. Sat EOB x ~ 10 minutes with BP remaining stable. Stood EOB 3 x but was unwilling to attempt ambulation/activity away from EOB. Encouraged pt to at least sit in recliner but she refused. BP dropped to as low as 94/59 (71). MAP was able to maintain > 65 throughout session. She does endorse slight dizziness however she was able to perform all desired task with increased time. TLSO was applied prior to standing. Removed brace once returned to bed. Pt is far from baseline. Will greatly benefit from SNF at DC to maximize independence while decreasing caregiver burden.    Recommendations for follow up therapy are one component of a multi-disciplinary discharge planning process, led by the attending physician.  Recommendations may be updated based on patient status, additional functional criteria and insurance authorization.  Follow Up Recommendations  Skilled nursing-short term rehab (<3 hours/day)     Assistance Recommended at Discharge Frequent or constant Supervision/Assistance   Patient can return home with the following A little help with walking and/or transfers;A lot of help with bathing/dressing/bathroom;Assistance with cooking/housework;Direct supervision/assist for medications management;Direct supervision/assist for financial management;Assist for transportation;Help with stairs or ramp for entrance   Equipment Recommendations   (defer to next level of care)       Precautions / Restrictions Precautions Precautions: Back Precaution Booklet Issued: No Required Braces or Orthoses: Spinal Brace Spinal Brace: Thoracolumbosacral orthotic;Applied in sitting position Restrictions Weight Bearing Restrictions: No     Mobility  Bed Mobility Overal bed mobility: Needs Assistance Bed Mobility: Rolling, Sidelying to Sit, Supine to Sit, Sit to Supine Rolling: Min guard Sidelying to sit: Min assist Supine to sit: Min assist Sit to supine: Mod assist     Transfers Overall transfer level: Needs assistance Equipment used: Rolling walker (2 wheels) Transfers: Sit to/from Stand Sit to Stand: Min assist, Mod assist, From elevated surface  General transfer comment: Pt was able to stand EOB x several minutes. Took side steps at EOB but was unwilling to ambulate away from EOB.    Ambulation/Gait Ambulation/Gait assistance: Min guard Gait Distance (Feet): 4 Feet Assistive device: Rolling walker (2 wheels) Gait Pattern/deviations: Step-to pattern    General Gait Details: pt was able to take side steps from FOB to [facility]. increased time to perfrom due to pain however only CGA for actual assistance. CGA for safety. BP 94/59 (71) after standing EOB x 4 minute       Cognition Arousal/Alertness: Awake/alert Behavior During Therapy: WFL for tasks assessed/performed Overall Cognitive Status: Impaired/Different from baseline  General Comments: Pt is alert and conversational. Per RN, cognition much improved from previous date.  General Comments General  comments (skin integrity, edema, etc.): Pt has TLSO applied while EOB, was able to stand and tolerate more activity today but remains limited by pain. Encouraged increased activity throughout the day.      Pertinent Vitals/Pain Pain Assessment Pain Assessment: 0-10 Pain Score: 9  Breathing: normal Negative Vocalization: none Facial Expression: smiling or inexpressive Body Language: relaxed Consolability: no need to console PAINAD Score: 0 Facial Expression: Grimacing Pain Location: low back Pain Descriptors / Indicators: Discomfort Pain Intervention(s): Limited activity within patient's tolerance, Monitored during session, Premedicated before session, Repositioned     PT Goals (current goals can now be found in the care plan section) Acute Rehab PT Goals Patient Stated Goal: rehab then home Progress towards PT goals: Progressing toward goals    Frequency    Min 2X/week      PT Plan Current plan remains appropriate       AM-PAC PT "6 Clicks" Mobility   Outcome Measure  Help needed turning from your back to your side while in a flat bed without using bedrails?: A Little Help needed moving from lying on your back to sitting on the side of a flat bed without using bedrails?: A Little Help needed moving to and from a bed to a chair (including a wheelchair)?: A Little Help needed standing up from a chair using your arms (e.g., wheelchair or bedside chair)?: A Little Help needed to walk in hospital room?: A Little Help needed climbing 3-5 steps with a railing? : A Lot 6 Click Score: 17    End of Session   Activity Tolerance: Patient tolerated treatment well;Patient limited by pain Patient left: in bed;with call bell/phone within reach;with bed alarm set;with nursing/sitter in room;with SCD's reapplied Nurse Communication: Mobility status;Precautions PT Visit Diagnosis: Muscle weakness (generalized) (M62.81);Difficulty in walking, not elsewhere classified  (R26.2);Pain;Unsteadiness on feet (R26.81)     Time: [id number] PT Time Calculation (min) (ACUTE ONLY): 24 min  Charges:  $Therapeutic Activity: 23-37 mins                     Julaine Fusi PTA 01/23/23, 1:06 PM

## 2023-01-24 ENCOUNTER — Encounter: Payer: Self-pay | Admitting: Neurosurgery

## 2023-01-24 DIAGNOSIS — R410 Disorientation, unspecified: Secondary | ICD-10-CM | POA: Diagnosis not present

## 2023-01-24 DIAGNOSIS — M549 Dorsalgia, unspecified: Secondary | ICD-10-CM | POA: Diagnosis not present

## 2023-01-24 DIAGNOSIS — Z7189 Other specified counseling: Secondary | ICD-10-CM | POA: Diagnosis not present

## 2023-01-24 DIAGNOSIS — R531 Weakness: Secondary | ICD-10-CM | POA: Diagnosis not present

## 2023-01-24 LAB — BASIC METABOLIC PANEL
Anion gap: 4 — ABNORMAL LOW (ref 5–15)
BUN: 16 mg/dL (ref 8–23)
CO2: 24 mmol/L (ref 22–32)
Calcium: 7.2 mg/dL — ABNORMAL LOW (ref 8.9–10.3)
Chloride: 107 mmol/L (ref 98–111)
Creatinine, Ser: 0.68 mg/dL (ref 0.44–1.00)
GFR, Estimated: >60 mL/min (ref >60)
Glucose, Bld: 90 mg/dL (ref 70–99)
Potassium: 3 mmol/L — ABNORMAL LOW (ref 3.5–5.1)
Sodium: 135 mmol/L (ref 135–145)

## 2023-01-24 LAB — CBC
HCT: 28.6 % — ABNORMAL LOW (ref 36.0–46.0)
Hemoglobin: 9.4 g/dL — ABNORMAL LOW (ref 12.0–15.0)
MCH: 29.8 pg (ref 26.0–34.0)
MCHC: 32.9 g/dL (ref 30.0–36.0)
MCV: 90.8 fL (ref 80.0–100.0)
Platelets: 261 10*3/uL (ref 150–400)
RBC: 3.15 MIL/uL — ABNORMAL LOW (ref 3.87–5.11)
RDW: 14.6 % (ref 11.5–15.5)
WBC: 8.3 10*3/uL (ref 4.0–10.5)
nRBC: 0 % (ref 0.0–0.2)

## 2023-01-24 LAB — GLUCOSE, CAPILLARY: Glucose-Capillary: 91 mg/dL (ref 70–99)

## 2023-01-24 MED ORDER — POTASSIUM CHLORIDE 20 MEQ PO PACK
40.0000 meq | PACK | Freq: Once | ORAL | Status: AC
  Administered 2023-01-24: 40 meq via ORAL
  Filled 2023-01-24: qty 2

## 2023-01-24 NOTE — Progress Notes (Signed)
Physical Therapy Treatment Patient Details Name: Caroline Zamora MRN: [id number] DOB: 19-Jun-1941 Today's Date: 01/24/2023   History of Present Illness 82 y.o. female with medical history significant of hypertension, hyperlipidemia, diastolic CHF, hypothyroidism, depression with anxiety, CKD 3A, PAF on Eliquis, colon cancer (s/p of surgery), orthostatic hypotension, who presents with frequent fall and back pain and was found to have T12 burst fracture, s/p T10-L2 decompression and fusion 2/16; post-op delirium. PMH: Afib, HTN, CKD, depression, migraines, and arthritis.    PT Comments    Pt very pleasant and motivated with PT session. Mentation much improved, still with some impulsivity and need for extra explanation, but ultimately hugely improved from a few days ago.  She was eager to actually get up and walk as she has essentially been bed bound the last week. She was able to do ~15 ft of ambulation but was much more walker reliant than she expected and did display b/l LE weakness with occasional low grade buckling (L>R) and one stagger needing minA to insure maintaining standing. Overall pt is showing great improvements, pt motivated to improve safety and stability with expectation that she can go home (not rehab) when medically cleared.  Pt is on a good trajectory, will continued with PT to work on functional mobility and safe transition.   Recommendations for follow up therapy are one component of a multi-disciplinary discharge planning process, led by the attending physician.  Recommendations may be updated based on patient status, additional functional criteria and insurance authorization.  Follow Up Recommendations  Skilled nursing-short term rehab (<3 hours/day) - per continued progress wants and hopefully can d/c home with HHPT Can patient physically be transported by private vehicle: Yes   Assistance Recommended at Discharge Frequent or constant Supervision/Assistance  Patient can  return home with the following A little help with walking and/or transfers;A lot of help with bathing/dressing/bathroom;Assistance with cooking/housework;Direct supervision/assist for medications management;Direct supervision/assist for financial management;Assist for transportation;Help with stairs or ramp for entrance   Equipment Recommendations  BSC/3in1    Recommendations for Other Services       Precautions / Restrictions Precautions Precautions: Back Required Braces or Orthoses: Spinal Brace Spinal Brace: Thoracolumbosacral orthotic;Applied in sitting position Restrictions Weight Bearing Restrictions: No     Mobility  Bed Mobility Overal bed mobility: Needs Assistance Bed Mobility: Rolling, Sidelying to Sit, Supine to Sit, Sit to Supine Rolling: Min guard Sidelying to sit: Min guard       General bed mobility comments: Pt did surprisingly well getting herself to sidelying and up to sitting with only minimal cuing.  Heavy UE use but no increased pain with the effort.    Transfers Overall transfer level: Needs assistance Equipment used: Rolling walker (2 wheels) Transfers: Sit to/from Stand Sit to Stand: Min assist           General transfer comment: 3 seperate sit to stand efforts this date.  Pt needing either slightly raised bed of heavy UE use of real to successfully rise, 2x with min A, 1x with CGA and elevated bed    Ambulation/Gait Ambulation/Gait assistance: Min assist Gait Distance (Feet): 15 Feet Assistive device: Rolling walker (2 wheels)         General Gait Details: Pt eager to try some actual ambulation, she was able to go ~15 ft but fatigued very quickly, had a few episodes of  knee buckling (L>R) and was surprised at how weak and difficult the effort felt.  Pt did have one buckling LOB needing light  assist to maintain upright.   Stairs             Wheelchair Mobility    Modified Rankin (Stroke Patients Only)       Balance Overall  balance assessment: Needs assistance Sitting-balance support: No upper extremity supported Sitting balance-Leahy Scale: Good     Standing balance support: Bilateral upper extremity supported, Reliant on assistive device for balance Standing balance-Leahy Scale: Fair Standing balance comment: Pt with poor standing tolerance, b/l LE weakness/buckling.  Heavy walker reliance.                            Cognition Arousal/Alertness: Awake/alert Behavior During Therapy: Impulsive, WFL for tasks assessed/performed Overall Cognitive Status: Impaired/Different from baseline                                 General Comments: Pt is alert and conversational. Apologetic about her prior AMS  and very pleasant and motivated todqy.  Still with mild confusion but able to participate well and greatly improved from when this PT last saw her.        Exercises General Exercises - Lower Extremity Ankle Circles/Pumps: AROM, Strengthening, 10 reps Long Arc Quad: Strengthening, 10 reps, Both Heel Slides: Strengthening, 10 reps, Both Hip ABduction/ADduction: Strengthening, 10 reps, Both Straight Leg Raises: AROM, 5 reps, Both    General Comments General comments (skin integrity, edema, etc.): Pt pleasant and eager to work with PT, reports she has been doing some LE exercises every hour.      Pertinent Vitals/Pain Pain Assessment Pain Assessment: Faces Faces Pain Scale: Hurts a little bit    Home Living                          Prior Function            PT Goals (current goals can now be found in the care plan section) Progress towards PT goals: Progressing toward goals    Frequency    7X/week      PT Plan Frequency needs to be updated    Co-evaluation              AM-PAC PT "6 Clicks" Mobility   Outcome Measure  Help needed turning from your back to your side while in a flat bed without using bedrails?: A Little Help needed moving from  lying on your back to sitting on the side of a flat bed without using bedrails?: A Little Help needed moving to and from a bed to a chair (including a wheelchair)?: A Little Help needed standing up from a chair using your arms (e.g., wheelchair or bedside chair)?: A Little Help needed to walk in hospital room?: A Little Help needed climbing 3-5 steps with a railing? : A Lot 6 Click Score: 17    End of Session Equipment Utilized During Treatment: Gait belt Activity Tolerance: Patient limited by fatigue Patient left: with chair alarm set;with call bell/phone within reach Nurse Communication: Mobility status PT Visit Diagnosis: Muscle weakness (generalized) (M62.81);Difficulty in walking, not elsewhere classified (R26.2);Pain;Unsteadiness on feet (R26.81) Pain - part of body:  (lumbago)     Time: 1113-1140 PT Time Calculation (min) (ACUTE ONLY): 27 min  Charges:  $Gait Training: 8-22 mins $Therapeutic Exercise: 8-22 mins  Kreg Shropshire, DPT 01/24/2023, 12:35 PM

## 2023-01-24 NOTE — Progress Notes (Signed)
Progress Note  History: Caroline Zamora is here for T12 fracture after multiple falls.  POD5: Pt reports improvement today.   POD4: pt is drowsy this morning but reports reasonable pain control.  POD3: Doing much better.  She had significant delirium over the weekend.  She is now doing much better.  She unfortunately did decline physical therapy today.  HD3: Pt reports continued severe back pain with significant pain in her legs, particularly her right thigh  HD2: During her bath today, the patient developed worsening pain in her back.  She is now unable to achieve any sort of pain relief when she moves.  She also has some cramping in her lower extremities that was not previously present.  She is able to feel her perineum and has control over her bowels and bladder function.  That being said, she has some cramping that was not previously present.  Her pain is quite different than it was prior to this event today.  Physical Exam: Vitals:   01/23/23 1600 01/24/23 0017  BP: 118/60 (!) 140/53  Pulse: 87 79  Resp: 16 18  Temp: 98.4 F (36.9 C) 98.5 F (36.9 C)  SpO2: 98% 95%    A&Ox3 CNI  Strength: MAEW Incision: covered with incisional wound vac. HV drain output 50 yesterday  Data:  Other tests/results:  Xrays 01/17/2023 IMPRESSION: 1. Unchanged severe T12 burst fracture.     Electronically Signed   By: Titus Dubin M.D.   On: 01/17/2023 15:21  Please note that the comparison study for this x-ray was her MRI scan from yesterday.  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  On her standing x-rays today, that kyphosis has increased to 30 degrees.  MRI TL spine 01/16/2023 IMPRESSION: 1. Subacute T12 body fracture with advanced height loss that is progressed from 12/11/2022 CT. Also progressed is retropulsion which causes lower cord compression. 2.  Chronic/degenerative changes as described.     Electronically Signed   By: Jorje Guild M.D.   On: 01/16/2023 05:39    Assessment/Plan:  Caroline Zamora is doing much better after her delirium has improved.  She is doing well after T10-L2 intervention for unstable T12 burst fracture, thoracic stenosis, and thoracic kyphosis.  - PTOT; reiterated to patient the importance of working with therapy - OK to wear brace OOB as needed - removed drain and wound vac today. Redressed incision. Dressing can be removed on 2/23 and wound left open to air. - Eliquis ok to restart on 02/02/2023.  - remainder of care per primary team.  Cooper Render PA-C Department of Neurosurgery

## 2023-01-24 NOTE — Plan of Care (Signed)
  Problem: Education: Goal: Knowledge of General Education information will improve Description: Including pain rating scale, medication(s)/side effects and non-pharmacologic comfort measures Outcome: Progressing   Problem: Health Behavior/Discharge Planning: Goal: Ability to manage health-related needs will improve Outcome: Progressing   Problem: Clinical Measurements: Goal: Ability to maintain clinical measurements within normal limits will improve Outcome: Progressing Goal: Will remain free from infection Outcome: Progressing Goal: Diagnostic test results will improve Outcome: Progressing Goal: Respiratory complications will improve Outcome: Progressing Goal: Cardiovascular complication will be avoided Outcome: Progressing   Problem: Activity: Goal: Risk for activity intolerance will decrease Outcome: Progressing   Problem: Nutrition: Goal: Adequate nutrition will be maintained Outcome: Progressing   Problem: Coping: Goal: Level of anxiety will decrease Outcome: Progressing   Problem: Elimination: Goal: Will not experience complications related to bowel motility Outcome: Progressing Goal: Will not experience complications related to urinary retention Outcome: Progressing   Problem: Pain Managment: Goal: General experience of comfort will improve Outcome: Progressing   Problem: Safety: Goal: Ability to remain free from injury will improve Outcome: Progressing   Problem: Skin Integrity: Goal: Risk for impaired skin integrity will decrease Outcome: Progressing   Problem: Education: Goal: Ability to verbalize activity precautions or restrictions will improve Outcome: Progressing Goal: Knowledge of the prescribed therapeutic regimen will improve Outcome: Progressing Goal: Understanding of discharge needs will improve Outcome: Progressing   Problem: Activity: Goal: Ability to avoid complications of mobility impairment will improve Outcome: Progressing Goal:  Ability to tolerate increased activity will improve Outcome: Progressing Goal: Will remain free from falls Outcome: Progressing   Problem: Bowel/Gastric: Goal: Gastrointestinal status for postoperative course will improve Outcome: Progressing   Problem: Clinical Measurements: Goal: Ability to maintain clinical measurements within normal limits will improve Outcome: Progressing Goal: Postoperative complications will be avoided or minimized Outcome: Progressing Goal: Diagnostic test results will improve Outcome: Progressing   Problem: Pain Management: Goal: Pain level will decrease Outcome: Progressing   Problem: Skin Integrity: Goal: Will show signs of wound healing Outcome: Progressing   Problem: Health Behavior/Discharge Planning: Goal: Identification of resources available to assist in meeting health care needs will improve Outcome: Progressing   Problem: Bladder/Genitourinary: Goal: Urinary functional status for postoperative course will improve Outcome: Progressing   Problem: Safety: Goal: Non-violent Restraint(s) Outcome: Progressing

## 2023-01-24 NOTE — Progress Notes (Signed)
Occupational Therapy Treatment Patient Details Name: VALEREE SCHAFFER MRN: [id number] DOB: 13-Apr-1941 Today's Date: 01/24/2023   History of present illness 82 y.o. female with medical history significant of hypertension, hyperlipidemia, diastolic CHF, hypothyroidism, depression with anxiety, CKD 3A, PAF on Eliquis, colon cancer (s/p of surgery), orthostatic hypotension, who presents with frequent fall and back pain and was found to have T12 burst fracture, s/p T10-L2 decompression and fusion 2/16; post-op delirium. PMH: Afib, HTN, CKD, depression, migraines, and arthritis.   OT comments  Upon entering the room, pt seated in recliner chair and urgently requesting to use bathroom for BM. Pt stands with min A and use of RW. She is impulsive with mobility and unaware of foley cath needing to be managed for safety with mobility. Pt transfers onto [facility] to have successful BM. Pt needing assistance to manage clothing and for thorough hygiene. Pt requesting to return to bed but encouraged to sit up in recliner chair for lunch and agreeable. Chair alarm activated and call bell within reach. Pt continues to benefit from OT intervention.    Recommendations for follow up therapy are one component of a multi-disciplinary discharge planning process, led by the attending physician.  Recommendations may be updated based on patient status, additional functional criteria and insurance authorization.    Follow Up Recommendations  Skilled nursing-short term rehab (<3 hours/day)     Assistance Recommended at Discharge Frequent or constant Supervision/Assistance  Patient can return home with the following  Assistance with cooking/housework;Assist for transportation;Help with stairs or ramp for entrance;A lot of help with walking and/or transfers;A lot of help with bathing/dressing/bathroom   Equipment Recommendations  Other (comment) (defer to next venue of care)       Precautions / Restrictions  Precautions Precautions: Back Required Braces or Orthoses: Spinal Brace Spinal Brace: Thoracolumbosacral orthotic;Applied in sitting position Restrictions Weight Bearing Restrictions: No       Mobility Bed Mobility               General bed mobility comments: seated in recliner chair    Transfers Overall transfer level: Needs assistance Equipment used: Rolling walker (2 wheels) Transfers: Sit to/from Stand, Bed to chair/wheelchair/BSC Sit to Stand: Min assist     Step pivot transfers: Min assist           Balance Overall balance assessment: Needs assistance Sitting-balance support: No upper extremity supported Sitting balance-Leahy Scale: Good     Standing balance support: Bilateral upper extremity supported, Reliant on assistive device for balance Standing balance-Leahy Scale: Fair Standing balance comment: Pt with poor standing tolerance, b/l LE weakness/buckling.  Heavy walker reliance.                           ADL either performed or assessed with clinical judgement   ADL Overall ADL's : Needs assistance/impaired                         Toilet Transfer: Minimal assistance;BSC/3in1;Cueing for safety;Rolling walker (2 wheels)   Toileting- Clothing Manipulation and Hygiene: Moderate assistance;Sit to/from stand Toileting - Clothing Manipulation Details (indicate cue type and reason): assistance for clothing management and hygiene            Extremity/Trunk Assessment Upper Extremity Assessment Upper Extremity Assessment: Generalized weakness   Lower Extremity Assessment Lower Extremity Assessment: Generalized weakness        Vision Patient Visual Report: No change from baseline  Perception     Praxis      Cognition Arousal/Alertness: Awake/alert Behavior During Therapy: Impulsive, WFL for tasks assessed/performed Overall Cognitive Status: Impaired/Different from baseline                                  General Comments: Pt is alert and conversational but needing cuing for safety awareness and confusion(not aware of foley cath)              General Comments Pt pleasant and eager to work with PT, reports she has been doing some LE exercises every hour.    Pertinent Vitals/ Pain       Pain Assessment Pain Assessment: Faces Faces Pain Scale: Hurts a little bit Pain Location: low back Pain Descriptors / Indicators: Discomfort Pain Intervention(s): Limited activity within patient's tolerance, Monitored during session, Premedicated before session, Repositioned         Frequency  Min 2X/week        Progress Toward Goals  OT Goals(current goals can now be found in the care plan section)  Progress towards OT goals: Progressing toward goals     Plan Discharge plan remains appropriate;Frequency remains appropriate       AM-PAC OT "6 Clicks" Daily Activity     Outcome Measure   Help from another person eating meals?: None Help from another person taking care of personal grooming?: A Little Help from another person toileting, which includes using toliet, bedpan, or urinal?: A Lot Help from another person bathing (including washing, rinsing, drying)?: A Lot Help from another person to put on and taking off regular upper body clothing?: A Little Help from another person to put on and taking off regular lower body clothing?: A Lot 6 Click Score: 16    End of Session Equipment Utilized During Treatment: Rolling walker (2 wheels)  OT Visit Diagnosis: Pain;Muscle weakness (generalized) (M62.81);Unsteadiness on feet (R26.81)   Activity Tolerance Patient tolerated treatment well   Patient Left in chair;with call bell/phone within reach;with chair alarm set   Nurse Communication Mobility status        Time: [id number] OT Time Calculation (min): 13 min  Charges: OT General Charges $OT Visit: 1 Visit OT Treatments $Self Care/Home Management : 8-22 mins  Darleen Crocker, MS, OTR/L , CBIS ascom [phone]  01/24/23, 2:42 PM

## 2023-01-24 NOTE — Progress Notes (Signed)
PROGRESS NOTE    Caroline Zamora  [id number] DOB: July 22, 1941 DOA: 01/16/2023 PCP: Kirk Ruths, MD   Brief Narrative: 82 year old with past medical history significant for hypertension, hyperlipidemia, diastolic heart failure, hypothyroidism, depression with anxiety, CKD 3A, PAF, on Eliquis, colon cancer s/p surgery, orthostatic hypotension who presents with frequent fall and back pain and was found to have T12 burst fracture.  2/13: Neurosurgery consulted 2/15: Transfer to stepdown and intensivist consulted.  Dilaudid pump started, Lyrica 25 mg 3 times daily and 1 dose of Decadron 2/16: Underwent T10-L2 posterior fusion 2/17: Precedex started, psych consulted 2/18: Off Precedex.  Started Seroquel and Depakote for mood.  Transfer to floor 2/19: Less agitated, still has one-to-one sitter as she requires close monitoring for her behavioral and safety 2/20: Her mental status is back to baseline.  Delirium seems to have resolved.  Stopped all narcotics and also discontinued Seroquel.     Assessment & Plan:   Principal Problem:   Intractable back pain Active Problems:   Closed unstable burst fracture of T12 vertebra (HCC)   Fall at home, initial encounter   Atrial fibrillation, chronic (HCC)   HTN (hypertension)   Chronic kidney disease, stage 3a (HCC)   Hypokalemia   Hypophosphatemia   Chronic diastolic CHF (congestive heart failure) (HCC)   Depression with anxiety   Hypothyroidism   Orthostatic hypotension   Thoracic spine instability   Spinal stenosis, thoracic   Other secondary kyphosis, thoracic region   Acute delirium  1-Acute metabolic encephalopathy, acute delirium In the setting of hospitalization, uncontrolled pain, medications. He required Precedex drip.  Narcotics Seroquel discontinued. On Depakote  2-Intractable back pain due to closed fracture of T12 vertebra -MRI showed subacute T12 body fracture with advanced height loss that is progressed from  9/80/2024.  Also progressing retropulsion which cause lower cord compression. -Repeated x-ray on 3/14 showed worsening of T12 burst fracture with evidence of instability with kyphosis now measuring 30 degrees versus 18 degrees on initial CT scan last month. -Underwent T 10-L2 posterior fusion on 12/16. -Neurosurgery following and is recommending HV drain to be removed today. -Continue with pain management   3-Hypokalemia and hypophosphatemia: Replace Replete oral potassium   Fall at home, initial encounter: PT, OT consulted.  She will need rehab  A-fib chronic: Surgery okay to resume Eliquis 02/02/2023  HTN: As needed hydralazine  CKD stage IIIa: Monitor  Chronic diastolic heart failure: Compensated Depression with anxiety continue with Effexor Hypothyroidism: Continue with Synthroid Orthostatic hypotension: Florinef.  Present outpatient by Dr. Caryl Comes.  This medication has been discontinued  Difficulty with IV access -She had a very poor access and finally IV team was able to establish midline on 2/18   Estimated body mass index is 29.05 kg/m as calculated from the following:   Height as of this encounter: 5' 6"$  (1.676 m).   Weight as of this encounter: 81.6 kg.   DVT prophylaxis: Lovenox Code Status: Full Code Family Communication: Care discussed with patient.  Disposition Plan:  Status is: Inpatient Remains inpatient appropriate because: management post op care    Consultants:  Neurosurgery   Procedures:  None  Antimicrobials:    Subjective: She is alert, pleasant, pain is controlled.   Objective: Vitals:   01/23/23 1600 01/24/23 0017 01/24/23 0828 01/24/23 1240  BP: 118/60 (!) 140/53 (!) 159/70 137/63  Pulse: 87 79 80 84  Resp: 16 18 16 15  $ Temp: 98.4 F (36.9 C) 98.5 F (36.9 C) 98 F (36.7 C) (!)  97.5 F (36.4 C)  TempSrc: Oral  Oral Oral  SpO2: 98% 95% 97% 99%  Weight:      Height:        Intake/Output Summary (Last 24 hours) at 01/24/2023  1314 Last data filed at 01/24/2023 1018 Gross per 24 hour  Intake 0 ml  Output 450 ml  Net -450 ml   Filed Weights   01/16/23 0443  Weight: 81.6 kg    Examination:  General exam: Appears calm and comfortable  Respiratory system: Clear to auscultation. Respiratory effort normal. Cardiovascular system: S1 & S2 heard, RRR.  Gastrointestinal system: Abdomen is nondistended, soft and nontender.  Central nervous system: Alert and oriented. Drain in placed back Extremities: Symmetric 5 x 5 power.    Data Reviewed: I have personally reviewed following labs and imaging studies  CBC: Recent Labs  Lab 01/19/23 0545 01/21/23 0443 01/22/23 1820 01/24/23 0627  WBC 7.3 8.0 8.0 8.3  NEUTROABS  --  5.9  --   --   HGB 12.1 9.8* 9.9* 9.4*  HCT 37.1 30.6* 30.9* 28.6*  MCV 92.8 93.9 93.1 90.8  PLT 313 221 232 0000000   Basic Metabolic Panel: Recent Labs  Lab 01/19/23 0545 01/21/23 0443 01/22/23 1820 01/24/23 0627  NA 136 139  --  135  K 3.7 3.0*  --  3.0*  CL 105 109  --  107  CO2 24 21*  --  24  GLUCOSE 117* 83  --  90  BUN 27* 32*  --  16  CREATININE 1.04* 0.89 0.87 0.68  CALCIUM 8.4* 7.2*  --  7.2*  MG 2.2 2.2  --   --   PHOS 5.0* 2.2*  --   --    GFR: Estimated Creatinine Clearance: 61.4 mL/min (by C-G formula based on SCr of 0.68 mg/dL). Liver Function Tests: No results for input(s): "AST", "ALT", "ALKPHOS", "BILITOT", "PROT", "ALBUMIN" in the last 168 hours. No results for input(s): "LIPASE", "AMYLASE" in the last 168 hours. No results for input(s): "AMMONIA" in the last 168 hours. Coagulation Profile: No results for input(s): "INR", "PROTIME" in the last 168 hours. Cardiac Enzymes: No results for input(s): "CKTOTAL", "CKMB", "CKMBINDEX", "TROPONINI" in the last 168 hours. BNP (last 3 results) No results for input(s): "PROBNP" in the last 8760 hours. HbA1C: No results for input(s): "HGBA1C" in the last 72 hours. CBG: Recent Labs  Lab 01/21/23 0145 01/21/23 0752  01/22/23 0757 01/23/23 0837 01/24/23 0831  GLUCAP 79 70 84 125* 91   Lipid Profile: No results for input(s): "CHOL", "HDL", "LDLCALC", "TRIG", "CHOLHDL", "LDLDIRECT" in the last 72 hours. Thyroid Function Tests: No results for input(s): "TSH", "T4TOTAL", "FREET4", "T3FREE", "THYROIDAB" in the last 72 hours. Anemia Panel: No results for input(s): "VITAMINB12", "FOLATE", "FERRITIN", "TIBC", "IRON", "RETICCTPCT" in the last 72 hours. Sepsis Labs: No results for input(s): "PROCALCITON", "LATICACIDVEN" in the last 168 hours.  Recent Results (from the past 240 hour(s))  Surgical PCR screen     Status: None   Collection Time: 01/18/23 11:51 AM   Specimen: Nasal Mucosa; Nasal Swab  Result Value Ref Range Status   MRSA, PCR NEGATIVE NEGATIVE Final   Staphylococcus aureus NEGATIVE NEGATIVE Final    Comment: (NOTE) The Xpert SA Assay (FDA approved for NASAL specimens in patients 69 years of age and older), is one component of a comprehensive surveillance program. It is not intended to diagnose infection nor to guide or monitor treatment. Performed at [facility], [place], [place]  [place]          Radiology Studies: No results found.      Scheduled Meds:  acetaminophen  1,000 mg Oral Q6H   amiodarone  200 mg Oral Daily   Chlorhexidine Gluconate Cloth  6 each Topical Daily   divalproex  250 mg Oral Q12H   enoxaparin (LOVENOX) injection  40 mg Subcutaneous Q24H   estradiol  0.5 mg Oral QHS   levothyroxine  50 mcg Oral QAC breakfast   lidocaine  1 patch Transdermal Q24H   sodium chloride flush  10-40 mL Intracatheter Q12H   sodium chloride flush  3 mL Intravenous Q12H   venlafaxine XR  150 mg Oral Daily   Continuous Infusions:  sodium chloride Stopped (01/19/23 1815)   sodium chloride 75 mL/hr at 01/22/23 0412     LOS: 8 days    Time spent: 35 minutes.     Elmarie Shiley, MD [facility]   If 7PM-7AM, please contact  night-coverage [email or web address]  01/24/2023, 1:14 PM

## 2023-01-24 NOTE — Progress Notes (Signed)
Patient ID: DELSEY FIEDOR, female   DOB: 1941/07/23, 82 y.o.   MRN: [id number]    Progress Note from the Palliative Medicine Team at [facility]   Patient Name: Caroline Zamora        Date: 01/24/2023 DOB: [birth year]  Age: 82 y.o. MRN#: [id number] Attending Physician: Elmarie Shiley, MD Primary Care Physician: Kirk Ruths, MD Admit Date: 01/16/2023   Medical records reviewed   82 y.o. female with medical history significant of hypertension, hyperlipidemia, diastolic CHF, hypothyroidism, depression with anxiety, CKD 3A, PAF on Eliquis, colon cancer (s/p of surgery), orthostatic hypotension, who presents with frequent fall and back pain and was found to have T12 burst fracture   2/13: Neurosurgery consult 2/14: Worsening of T12 burst fracture which is unstable and now requiring surgery which is scheduled for Friday by neurosurgery 2/15: Transfer to stepdown and intensivist consult.  Dilaudid pump started, Lyrica 25 mg 3 times daily and 1 dose of Decadron 2/16: T10-L2 posterior fusion 2/17: Precedex started, psych consult 2/18: Off Precedex.  Started Seroquel and Depakote for mood.  Transfer to floor 2/19: Remains with one-on-one sitter, resting more comfortably today 2-20 : mentation is clearing 2-21 patient is alert and oriented x 3, working with therapies   This NP assessed patient at the bedside as a follow up to initial  [place].  Spoke to son by telephone, he is her healthcare power of attorney.  Patient does have advance care planning documents in [place].     Education offered to patient  and son on the importance of successful rehabilitation on discharge from the hospital.  Occasion offered on the difference between an acute short-term rehab versus home with physical therapy on discharge.  Patient and son are leaning towards going home with home therapies in place.  Education offered today regarding  the importance of continued conversation with family  and the  medical providers regarding overall plan of care and treatment options,  ensuring decisions are within the context of the patients values and GOCs.   Patient has healthcare power of attorney and advance care planning documents scanned in [place]  Questions and concerns addressed   Discussed with Dr Tyrell Antonio   35 minutes  Wadie Lessen NP  Palliative Medicine Team Team Phone # [phone] Pager [phone]

## 2023-01-24 NOTE — Discharge Instructions (Signed)
Your surgeon has performed an operation on your lumbar spine (low back) to relieve pressure on one or more nerves. Many times, patients feel better immediately after surgery and can "overdo it." Even if you feel well, it is important that you follow these activity guidelines. If you do not let your back heal properly from the surgery, you can increase the chance of return of your symptoms. The following are instructions to help in your recovery once you have been discharged from the hospital.  Do not take NSAID medication.  Activity    No bending, lifting, or twisting ("BLT"). Avoid lifting objects heavier than 10 pounds (gallon milk jug).  Where possible, avoid household activities that involve lifting, bending, pushing, or pulling such as laundry, vacuuming, grocery shopping, and childcare. Try to arrange for help from friends and family for these activities while your back heals.  Increase physical activity slowly as tolerated.  Taking short walks is encouraged, but avoid strenuous exercise. Do not jog, run, bicycle, lift weights, or participate in any other exercises unless specifically allowed by your doctor. Avoid prolonged sitting, including car rides.  Talk to your doctor before resuming sexual activity.  You should not drive until cleared by your doctor.  Until released by your doctor, you should not return to work or school.  You should rest at home and let your body heal.   You may shower two days after your surgery.  After showering, lightly dab your incision dry. Do not take a tub bath or go swimming for 3 weeks, or until approved by your doctor at your follow-up appointment.  If you smoke, we strongly recommend that you quit.  Smoking has been proven to interfere with normal healing in your back and will dramatically reduce the success rate of your surgery. Please contact [facility] (800-QUIT-NOW) and use the resources at www.[facility].com for assistance in stopping  smoking.  Surgical Incision   If you have a dressing on your incision, you may remove it three days after your surgery. Keep your incision area clean and dry.  If you have staples or stitches on your incision, you should have a follow up scheduled for removal. If you do not have staples or stitches, you will have steri-strips (small pieces of surgical tape) or Dermabond glue. The steri-strips/glue should begin to peel away within about a week (it is fine if the steri-strips fall off before then). If the strips are still in place one week after your surgery, you may gently remove them.  Diet            You may return to your usual diet. Be sure to stay hydrated.  When to Contact us  Although your surgery and recovery will likely be uneventful, you may have some residual numbness, aches, and pains in your back and/or legs. This is normal and should improve in the next few weeks.  However, should you experience any of the following, contact us immediately: New numbness or weakness Pain that is progressively getting worse, and is not relieved by your pain medications or rest Bleeding, redness, swelling, pain, or drainage from surgical incision Chills or flu-like symptoms Fever greater than 101.0 F (38.3 C) Problems with bowel or bladder functions Difficulty breathing or shortness of breath Warmth, tenderness, or swelling in your calf  Contact Information During office hours (Monday-Friday 9 am to 5 pm), please call your physician at [phone] and ask for Berdine Addison After hours and weekends, please call [phone] and speak with the  neurosurgeon on call For a life-threatening emergency, call 911

## 2023-01-25 ENCOUNTER — Telehealth: Payer: Self-pay | Admitting: Internal Medicine

## 2023-01-25 DIAGNOSIS — M549 Dorsalgia, unspecified: Secondary | ICD-10-CM | POA: Diagnosis not present

## 2023-01-25 MED ORDER — POTASSIUM CHLORIDE CRYS ER 20 MEQ PO TBCR
20.0000 meq | EXTENDED_RELEASE_TABLET | Freq: Once | ORAL | Status: AC
  Administered 2023-01-25: 20 meq via ORAL
  Filled 2023-01-25: qty 1

## 2023-01-25 MED ORDER — POTASSIUM CHLORIDE CRYS ER 20 MEQ PO TBCR: 40.0000 meq | EXTENDED_RELEASE_TABLET | ORAL | Status: DC

## 2023-01-25 NOTE — Plan of Care (Signed)
  Problem: Skin Integrity: Goal: Risk for impaired skin integrity will decrease Outcome: Progressing   Problem: Education: Goal: Ability to verbalize activity precautions or restrictions will improve Outcome: Progressing   Problem: Pain Managment: Goal: General experience of comfort will improve Outcome: Progressing   Problem: Elimination: Goal: Will not experience complications related to bowel motility Outcome: Progressing   Problem: Activity: Goal: Ability to avoid complications of mobility impairment will improve Outcome: Progressing

## 2023-01-25 NOTE — Plan of Care (Signed)

## 2023-01-25 NOTE — Progress Notes (Signed)
Progress Note  History: Caroline Zamora is here for T12 fracture after multiple falls.  POD6: have episodes of HTN when getting up with PT this morning.   POD5: Pt reports improvement today.   POD4: pt is drowsy this morning but reports reasonable pain control.  POD3: Doing much better.  She had significant delirium over the weekend.  She is now doing much better.  She unfortunately did decline physical therapy today.  HD3: Pt reports continued severe back pain with significant pain in her legs, particularly her right thigh  HD2: During her bath today, the patient developed worsening pain in her back.  She is now unable to achieve any sort of pain relief when she moves.  She also has some cramping in her lower extremities that was not previously present.  She is able to feel her perineum and has control over her bowels and bladder function.  That being said, she has some cramping that was not previously present.  Her pain is quite different than it was prior to this event today.  Physical Exam: Vitals:   01/24/23 1545 01/25/23 0051  BP: (!) 152/64 (!) 161/60  Pulse: 75 77  Resp: 16 18  Temp: 97.9 F (36.6 C) 98.2 F (36.8 C)  SpO2: 100% 98%    A&Ox3 CNI  Strength: MAEW. Pt sitting up at bedside with PT Incision: with clean dressing in place.  Data:  Other tests/results:  Xrays 01/17/2023 IMPRESSION: 1. Unchanged severe T12 burst fracture.     Electronically Signed   By: Titus Dubin M.D.   On: 01/17/2023 15:21  Please note that the comparison study for this x-ray was her MRI scan from yesterday.  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  On her standing x-rays today, that kyphosis has increased to 30 degrees.  MRI TL spine 01/16/2023 IMPRESSION: 1. Subacute T12 body fracture with advanced height loss that is progressed from 12/11/2022 CT. Also progressed is  retropulsion which causes lower cord compression. 2. Chronic/degenerative changes as described.     Electronically Signed   By: Jorje Guild M.D.   On: 01/16/2023 05:39    Assessment/Plan:  Caroline Zamora is doing much better after her delirium has improved.  She is doing well after T10-L2 intervention for unstable T12 burst fracture, thoracic stenosis, and thoracic kyphosis.  - PTOT - OK to wear brace OOB as needed - removed drain and wound vac on 2/21. Dressing can be removed on 2/23 and wound left open to air. - Eliquis ok to restart on 02/02/2023.  - remainder of care per primary team.  Cooper Render PA-C Department of Neurosurgery

## 2023-01-25 NOTE — Telephone Encounter (Signed)
Spoke with pt's son,DPR who states pt's spinal fracture escalated to surgery.  She is currently in the hospital and may need go to rehab to recover.  Pt's son states during 01/11/2023 telephone visit with Dr Caryl Comes he recommended Proamatine for treatment of orthostatic hypotension but medication has not been sent in.   Spoke with Dr Caryl Comes who advises Proamatine not be sent in to pharmacy and started as hospital will be working with pt's orthostatic hypotension and ambulation.  Pt is to have a f/u telephone visit with Dr Caryl Comes in a couple of weeks that has not yet been scheduled.  Will send request to Dr Olin Pia scheduler.  Pt's son verbalizes understanding and agrees with current plan.

## 2023-01-25 NOTE — TOC Progression Note (Signed)
Transition of Care [facility]) - Progression Note    Patient Details  Name: Caroline Zamora MRN: [id number] Date of Birth: January 12, 1941  [facility]) CM/SW Contact  Laurena Slimmer, RN Phone Number: 01/25/2023, 9:30 PM  Clinical Narrative:    Spoke with patient and her son regarding therapy recommendation. She and her son are agreeable to DNF and do not have a preference of facility. Patient stated she would preferably like to a SNF in [place]. They were advised SNF stay would have to be approved by insurance.  Luverne PASSAR obtained FL2 completed  Bed search started.    Expected Discharge Plan: [place] Barriers to Discharge: Continued Medical Work up  Expected Discharge Plan and Services   Discharge Planning Services: CM Consult   Living arrangements for the past 2 months: Single Family Home                 DME Arranged: N/A                     Social Determinants of Health (SDOH) Interventions SDOH Screenings   Food Insecurity: No Food Insecurity (01/21/2023)  Housing: Low Risk  (01/21/2023)  Transportation Needs: No Transportation Needs (01/21/2023)  Utilities: Not At Risk (01/21/2023)  Tobacco Use: Low Risk  (01/24/2023)    Readmission Risk Interventions     No data to display

## 2023-01-25 NOTE — Progress Notes (Signed)
PROGRESS NOTE    Caroline Zamora  [id number] DOB: 02-24-1941 DOA: 01/16/2023 PCP: Kirk Ruths, MD   Brief Narrative: 82 year old with past medical history significant for hypertension, hyperlipidemia, diastolic heart failure, hypothyroidism, depression with anxiety, CKD 3A, PAF, on Eliquis, colon cancer s/p surgery, orthostatic hypotension who presents with frequent fall and back pain and was found to have T12 burst fracture.  2/13: Neurosurgery consulted 2/15: Transfer to stepdown and intensivist consulted.  Dilaudid pump started, Lyrica 25 mg 3 times daily and 1 dose of Decadron 2/16: Underwent T10-L2 posterior fusion 2/17: Precedex started, psych consulted 2/18: Off Precedex.  Started Seroquel and Depakote for mood.  Transfer to floor 2/19: Less agitated, still has one-to-one sitter as she requires close monitoring for her behavioral and safety 2/20: Her mental status is back to baseline.  Delirium seems to have resolved.  Stopped all narcotics and also discontinued Seroquel.     Assessment & Plan:   Principal Problem:   Intractable back pain Active Problems:   Closed unstable burst fracture of T12 vertebra (HCC)   Fall at home, initial encounter   Atrial fibrillation, chronic (HCC)   HTN (hypertension)   Chronic kidney disease, stage 3a (HCC)   Hypokalemia   Hypophosphatemia   Chronic diastolic CHF (congestive heart failure) (HCC)   Depression with anxiety   Hypothyroidism   Orthostatic hypotension   Thoracic spine instability   Spinal stenosis, thoracic   Other secondary kyphosis, thoracic region   Acute delirium  1-Acute metabolic encephalopathy, acute delirium In the setting of hospitalization, uncontrolled pain, medications. He required Precedex drip.  Narcotics Seroquel discontinued. On Depakote  2-Intractable back pain due to closed fracture of T12 vertebra -MRI showed subacute T12 body fracture with advanced height loss that is progressed from  9/80/2024.  Also progressing retropulsion which cause lower cord compression. -Repeated x-ray on 3/14 showed worsening of T12 burst fracture with evidence of instability with kyphosis now measuring 30 degrees versus 18 degrees on initial CT scan last month. -Underwent T 10-L2 posterior fusion on 12/16. -Neurosurgery following  Drain removed 2/21. Remove dressing 2/23 -Continue with pain management   3-Hypokalemia and hypophosphatemia: Replace Replete oral potassium   Fall at home, initial encounter: PT, OT consulted.  She will need rehab Patient Agrees with  rehab.   A-fib chronic: Surgery okay to resume Eliquis 02/02/2023  HTN: As needed hydralazine  CKD stage IIIa: Monitor  Chronic diastolic heart failure: Compensated Depression with anxiety continue with Effexor Hypothyroidism: Continue with Synthroid Orthostatic hypotension: Florinef.  Present outpatient by Dr. Caryl Comes.  This medication has been discontinued  Difficulty with IV access -She had a very poor access and finally IV team was able to establish midline on 2/18   Estimated body mass index is 29.05 kg/m as calculated from the following:   Height as of this encounter: 5' 6"$  (1.676 m).   Weight as of this encounter: 81.6 kg.   DVT prophylaxis: Lovenox Code Status: Full Code Family Communication: Care discussed with patient.  Disposition Plan:  Status is: Inpatient Remains inpatient appropriate because: management post op care    Consultants:  Neurosurgery   Procedures:  None  Antimicrobials:    Subjective: She is alert, she agrees to go to rehab, she is weak and her legs were  shaking while working with PT>  Per son, she was confuse last night.   Objective: Vitals:   01/24/23 1545 01/25/23 0051 01/25/23 0836 01/25/23 0925  BP: (!) 152/64 (!) 161/60 (!) 163/90 Marland Kitchen)  144/68  Pulse: 75 77 83 79  Resp: 16 18 17   $ Temp: 97.9 F (36.6 C) 98.2 F (36.8 C) 97.7 F (36.5 C)   TempSrc: Oral     SpO2: 100%  98% 100%   Weight:      Height:        Intake/Output Summary (Last 24 hours) at 01/25/2023 1454 Last data filed at 01/25/2023 0533 Gross per 24 hour  Intake 240 ml  Output 350 ml  Net -110 ml    Filed Weights   01/16/23 0443  Weight: 81.6 kg    Examination:  General exam: NAD Respiratory system: CTA Cardiovascular system: S 1, S 2 RRR Gastrointestinal system: BS present, soft, nt Central nervous system: Alert, in no distress Extremities: no edema    Data Reviewed: I have personally reviewed following labs and imaging studies  CBC: Recent Labs  Lab 01/19/23 0545 01/21/23 0443 01/22/23 1820 01/24/23 0627  WBC 7.3 8.0 8.0 8.3  NEUTROABS  --  5.9  --   --   HGB 12.1 9.8* 9.9* 9.4*  HCT 37.1 30.6* 30.9* 28.6*  MCV 92.8 93.9 93.1 90.8  PLT 313 221 232 0000000    Basic Metabolic Panel: Recent Labs  Lab 01/19/23 0545 01/21/23 0443 01/22/23 1820 01/24/23 0627  NA 136 139  --  135  K 3.7 3.0*  --  3.0*  CL 105 109  --  107  CO2 24 21*  --  24  GLUCOSE 117* 83  --  90  BUN 27* 32*  --  16  CREATININE 1.04* 0.89 0.87 0.68  CALCIUM 8.4* 7.2*  --  7.2*  MG 2.2 2.2  --   --   PHOS 5.0* 2.2*  --   --     GFR: Estimated Creatinine Clearance: 61.4 mL/min (by C-G formula based on SCr of 0.68 mg/dL). Liver Function Tests: No results for input(s): "AST", "ALT", "ALKPHOS", "BILITOT", "PROT", "ALBUMIN" in the last 168 hours. No results for input(s): "LIPASE", "AMYLASE" in the last 168 hours. No results for input(s): "AMMONIA" in the last 168 hours. Coagulation Profile: No results for input(s): "INR", "PROTIME" in the last 168 hours. Cardiac Enzymes: No results for input(s): "CKTOTAL", "CKMB", "CKMBINDEX", "TROPONINI" in the last 168 hours. BNP (last 3 results) No results for input(s): "PROBNP" in the last 8760 hours. HbA1C: No results for input(s): "HGBA1C" in the last 72 hours. CBG: Recent Labs  Lab 01/21/23 0145 01/21/23 0752 01/22/23 0757 01/23/23 0837  01/24/23 0831  GLUCAP 79 70 84 125* 91    Lipid Profile: No results for input(s): "CHOL", "HDL", "LDLCALC", "TRIG", "CHOLHDL", "LDLDIRECT" in the last 72 hours. Thyroid Function Tests: No results for input(s): "TSH", "T4TOTAL", "FREET4", "T3FREE", "THYROIDAB" in the last 72 hours. Anemia Panel: No results for input(s): "VITAMINB12", "FOLATE", "FERRITIN", "TIBC", "IRON", "RETICCTPCT" in the last 72 hours. Sepsis Labs: No results for input(s): "PROCALCITON", "LATICACIDVEN" in the last 168 hours.  Recent Results (from the past 240 hour(s))  Surgical PCR screen     Status: None   Collection Time: 01/18/23 11:51 AM   Specimen: Nasal Mucosa; Nasal Swab  Result Value Ref Range Status   MRSA, PCR NEGATIVE NEGATIVE Final   Staphylococcus aureus NEGATIVE NEGATIVE Final    Comment: (NOTE) The Xpert SA Assay (FDA approved for NASAL specimens in patients 9 years of age and older), is one component of a comprehensive surveillance program. It is not intended to diagnose infection nor to guide or monitor treatment. Performed at  [facility], [address], [place], [place] [address]          Radiology Studies: No results found.      Scheduled Meds:  acetaminophen  1,000 mg Oral Q6H   amiodarone  200 mg Oral Daily   Chlorhexidine Gluconate Cloth  6 each Topical Daily   divalproex  250 mg Oral Q12H   enoxaparin (LOVENOX) injection  40 mg Subcutaneous Q24H   estradiol  0.5 mg Oral QHS   levothyroxine  50 mcg Oral QAC breakfast   lidocaine  1 patch Transdermal Q24H   sodium chloride flush  10-40 mL Intracatheter Q12H   sodium chloride flush  3 mL Intravenous Q12H   venlafaxine XR  150 mg Oral Daily   Continuous Infusions:  sodium chloride Stopped (01/19/23 1815)     LOS: 9 days    Time spent: 35 minutes.     Elmarie Shiley, MD [facility]   If 7PM-7AM, please contact night-coverage [email or web address]  01/25/2023, 2:54 PM

## 2023-01-25 NOTE — Progress Notes (Signed)
Physical Therapy Treatment Patient Details Name: Caroline Zamora MRN: [id number] DOB: [birth year] Today's Date: 01/25/2023   History of Present Illness 82 y.o. female with medical history significant of hypertension, hyperlipidemia, diastolic CHF, hypothyroidism, depression with anxiety, CKD 3A, PAF on Eliquis, colon cancer (s/p of surgery), orthostatic hypotension, who presents with frequent fall and back pain and was found to have T12 burst fracture, s/p T10-L2 decompression and fusion 2/16; post-op delirium. PMH: Afib, HTN, CKD, depression, migraines, and arthritis.    PT Comments    Pt showed good effort and remains very pleasant t/o session but functional continues to be limited.  She was able to get to sitting EOB relatively easily but c/o dizziness and profound weakness t/o most of the session.  Multiple attempts at taking BP in sitting and standing, she did not appear to have any orthostatic responses/symptoms, instead appears to have elevated BP t/o the session regardless of positional change.  Pt showed good effort with supine exercises, static standing and marching in place and attempt at brief bout of ambulation but was too weak and dizzy to safely be able to do a whole lot.  Pt continues to be motivated to work hard and go home, reports she has been doing some of her HEP exercises every hour or so.     Recommendations for follow up therapy are one component of a multi-disciplinary discharge planning process, led by the attending physician.  Recommendations may be updated based on patient status, additional functional criteria and insurance authorization.  Follow Up Recommendations  Skilled nursing-short term rehab (<3 hours/day) Can patient physically be transported by private vehicle: Yes   Assistance Recommended at Discharge Frequent or constant Supervision/Assistance  Patient can return home with the following A little help with walking and/or transfers;A lot of help with  bathing/dressing/bathroom;Assistance with cooking/housework;Direct supervision/assist for medications management;Direct supervision/assist for financial management;Assist for transportation;Help with stairs or ramp for entrance   Equipment Recommendations  BSC/3in1    Recommendations for Other Services       Precautions / Restrictions Precautions Precautions: Back Precaution Booklet Issued: No Required Braces or Orthoses: Spinal Brace Spinal Brace: Thoracolumbosacral orthotic;Applied in sitting position Restrictions Weight Bearing Restrictions: No     Mobility  Bed Mobility Overal bed mobility: Needs Assistance Bed Mobility: Rolling, Sidelying to Sit, Supine to Sit, Sit to Supine Rolling: Modified independent (Device/Increase time) Sidelying to sit: Min guard       General bed mobility comments: Pt showed good effort with getting to sitting, able to maintian neutral spine and needed only minimal cuing.    Transfers Overall transfer level: Needs assistance Equipment used: Rolling walker (2 wheels) Transfers: Sit to/from Stand, Bed to chair/wheelchair/BSC Sit to Stand: Min assist           General transfer comment: pt unable to rise from standard height bed x 3 despite much cuing for appropriate set up, hand placement, etc.  Raised bed ~3" and both times standing from this she struggled but got to upright standing with only CGA and much encouragement    Ambulation/Gait Ambulation/Gait assistance: Min assist Gait Distance (Feet): 6 Feet Assistive device: Rolling walker (2 wheels) Gait Pattern/deviations: Step-to pattern       General Gait Details: Pt again eager to try a longer bout of ambulation, but only managed a few feet and needed to sit.   Pt did not have overt buckling today but did endorse consistent dizziness.  Pt remains eager to try and walk/see what she can do  but was too dizzy and with elevated BP to safely attempt longer effort at this time.   Stairs              Wheelchair Mobility    Modified Rankin (Stroke Patients Only)       Balance Overall balance assessment: Needs assistance Sitting-balance support: No upper extremity supported Sitting balance-Leahy Scale: Good     Standing balance support: Bilateral upper extremity supported, Reliant on assistive device for balance Standing balance-Leahy Scale: Fair Standing balance comment: Pt with poor standing tolerance, b/l LE weakness/buckling.  Heavy walker reliance.                            Cognition Arousal/Alertness: Awake/alert Behavior During Therapy: Impulsive, WFL for tasks assessed/performed Overall Cognitive Status: Impaired/Different from baseline Area of Impairment: Safety/judgement, Awareness                               General Comments: continues to show improved cognition, but still with tangential speaking and showed some impulsivity general awarness concerns        Exercises General Exercises - Lower Extremity Ankle Circles/Pumps: AROM, Strengthening, 10 reps Heel Slides: Strengthening, 10 reps (with resisted leg ext) Hip ABduction/ADduction: Strengthening, 10 reps, Both Straight Leg Raises: AROM, 10 reps Hip Flexion/Marching: AROM, 10 reps    General Comments General comments (skin integrity, edema, etc.): Pt pleasant and motivated but limited due to dizziness, mild confusion and she does remain quite weak in b/l LEs.  BP in sitting 177/84, attempted to take in standing and had some spurious elevated readings with high 100s over low 100s on multiple attempts.  Returned to sitting, nursing notified.      Pertinent Vitals/Pain Pain Assessment Pain Assessment: 0-10 Pain Score: 2  Pain Location: low back    Home Living                          Prior Function            PT Goals (current goals can now be found in the care plan section) Progress towards PT goals: Progressing toward goals     Frequency    7X/week      PT Plan Frequency needs to be updated    Co-evaluation              AM-PAC PT "6 Clicks" Mobility   Outcome Measure  Help needed turning from your back to your side while in a flat bed without using bedrails?: A Little Help needed moving from lying on your back to sitting on the side of a flat bed without using bedrails?: A Little Help needed moving to and from a bed to a chair (including a wheelchair)?: A Little Help needed standing up from a chair using your arms (e.g., wheelchair or bedside chair)?: A Little Help needed to walk in hospital room?: A Lot Help needed climbing 3-5 steps with a railing? : Total 6 Click Score: 15    End of Session Equipment Utilized During Treatment: Gait belt Activity Tolerance: Patient limited by fatigue Patient left: with call bell/phone within reach;with nursing/sitter in room;in chair Nurse Communication: Mobility status PT Visit Diagnosis: Muscle weakness (generalized) (M62.81);Difficulty in walking, not elsewhere classified (R26.2);Pain;Unsteadiness on feet (R26.81) Pain - part of body:  (lumbago)     Time: [id number] PT Time Calculation (min) (ACUTE  ONLY): 43 min  Charges:  $Gait Training: 8-22 mins $Therapeutic Exercise: 8-22 mins $Therapeutic Activity: 8-22 mins                     Kreg Shropshire, DPT 01/25/2023, 11:04 AM

## 2023-01-25 NOTE — Telephone Encounter (Signed)
Son is calling in with questions concerning patient medication. Please advise

## 2023-01-25 NOTE — NC FL2 (Signed)
[place] LEVEL OF CARE FORM     IDENTIFICATION  Patient Name: Caroline Zamora Birthdate: [birth year] Sex: female Admission Date (Current Location): 01/16/2023  [facility] and [place] Number:  Engineering geologist and Address:  [facility], [address], [place], [place] [address]      Provider Number: [id number]  Attending Physician Name and Address:  Elmarie Shiley, MD  Relative Name and Phone Number:  AIMY, STEPIEN [facility]) [phone] [facility] Phone)    Current Level of Care: Hospital Recommended Level of Care: [place] Prior Approval Number:    Date Approved/Denied:   PASRR Number: [id number]  Discharge Plan: SNF    Current Diagnoses: Patient Active Problem List   Diagnosis Date Noted   Acute delirium 01/21/2023   Other secondary kyphosis, thoracic region 01/19/2023   Thoracic spine instability 01/17/2023   Spinal stenosis, 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   Intractable back pain 01/16/2023   Closed unstable burst fracture of T12 vertebra ([place]) 01/16/2023   Fall at home, initial encounter 01/16/2023   HTN (hypertension) 01/16/2023   Chronic kidney disease, stage 3a ([place]) 01/16/2023   Chronic diastolic CHF (congestive heart failure) ([place]) 01/16/2023   Depression with anxiety 01/16/2023   Hypophosphatemia 01/16/2023   Hypothyroidism 01/16/2023   Orthostatic hypotension 12/14/2022   Orthostasis 12/13/2022   Anxiety attack 12/13/2022   Paroxysmal atrial fibrillation with RVR (HCC) 12/13/2022   Hypokalemia 12/12/2022   Atrial fibrillation, chronic (HCC) 12/12/2022   Prolonged Q-T interval on ECG 12/12/2022   Syncope 12/11/2022   Elevated troponin 12/11/2022   Prolonged QT interval 12/11/2022   CAP (community acquired pneumonia) 12/11/2022   Type 2 diabetes mellitus ([place]) 12/11/2022   Fall 12/11/2022   Weakness 05/16/2022   Diarrhea 05/16/2022   UTI (urinary tract infection) 05/16/2022   Recurrent syncope  04/15/2022   Syncope and collapse 04/14/2022   Depression 04/14/2022   HFrEF (heart failure with reduced ejection fraction) ([place]) 04/14/2022   A-fib ([place]) 07/04/2019    Orientation RESPIRATION BLADDER Height & Weight     Self, Time, Situation, Place  Normal Continent Weight: 81.6 kg Height:  '5\' 6"'$  (167.6 cm)  BEHAVIORAL SYMPTOMS/MOOD NEUROLOGICAL BOWEL NUTRITION STATUS  Other (Comment) (n/a)  (n/a) Continent Diet  AMBULATORY STATUS COMMUNICATION OF NEEDS Skin   Limited Assist Verbally Surgical wounds, Wound Vac (back)                       Personal Care Assistance Level of Assistance  Bathing, Dressing Bathing Assistance: Limited assistance   Dressing Assistance: Limited assistance     Functional Limitations Info  Sight Sight Info: Impaired        SPECIAL CARE FACTORS FREQUENCY  PT (By licensed PT), OT (By licensed OT)     PT Frequency: Min 2x weekly OT Frequency: Min 2x weekly            Contractures Contractures Info: Not present    Additional Factors Info  Code Status, Allergies Code Status Info: FULL Allergies Info: Hydromorphone, Desyrel (Trazodone), Sulfa Antibiotics           Current Medications (01/25/2023):  This is the current hospital active medication list Current Facility-Administered Medications  Medication Dose Route Frequency Provider Last Rate Last Admin   0.9 %  sodium chloride infusion  250 mL Intravenous Continuous Loleta Dicker, PA   Paused at 01/19/23 1815   acetaminophen (TYLENOL) tablet 1,000 mg  1,000 mg Oral Q6H Loleta Dicker, [place]  1,000 mg at 01/25/23 1634   amiodarone (PACERONE) tablet 200 mg  200 mg Oral Daily Loleta Dicker, PA   200 mg at 01/25/23 [id number]   Chlorhexidine Gluconate Cloth 2 % PADS 6 each  6 each Topical Daily Loleta Dicker, PA   6 each at 01/25/23 [id number]   diphenhydrAMINE (BENADRYL) injection 12.5 mg  12.5 mg Intravenous Q8H PRN Loleta Dicker, PA       divalproex (DEPAKOTE) DR tablet 250  mg  250 mg Oral Q12H Patrecia Pour, NP   250 mg at 01/25/23 [id number]   enoxaparin (LOVENOX) injection 40 mg  40 mg Subcutaneous Q24H Meade Maw, MD   40 mg at 01/25/23 [id number]   estradiol (ESTRACE) tablet 0.5 mg  0.5 mg Oral QHS Loleta Dicker, PA   0.5 mg at 01/24/23 2100   ibuprofen (ADVIL) tablet 400 mg  400 mg Oral Q6H PRN Max Sane, MD       levothyroxine (SYNTHROID) tablet 50 mcg  50 mcg Oral QAC breakfast Loleta Dicker, PA   50 mcg at 01/25/23 0504   lidocaine (LIDODERM) 5 % 1 patch  1 patch Transdermal Q24H Loleta Dicker, [place]   1 patch at 01/25/23 [id number]   magnesium hydroxide (MILK OF MAGNESIA) suspension 30 mL  30 mL Oral Daily PRN Loleta Dicker, PA       melatonin tablet 5 mg  5 mg Oral QHS PRN Loleta Dicker, PA   5 mg at 01/18/23 2351   menthol-cetylpyridinium (CEPACOL) lozenge 3 mg  1 lozenge Oral PRN Loleta Dicker, PA       Or   phenol (CHLORASEPTIC) mouth spray 1 spray  1 spray Mouth/Throat PRN Loleta Dicker, PA       methocarbamol (ROBAXIN) tablet 500 mg  500 mg Oral Q6H PRN Max Sane, MD   500 mg at 01/23/23 2149   sodium chloride flush (NS) 0.9 % injection 10-40 mL  10-40 mL Intracatheter Q12H Max Sane, MD   10 mL at 01/25/23 0919   sodium chloride flush (NS) 0.9 % injection 10-40 mL  10-40 mL Intracatheter PRN Max Sane, MD       sodium chloride flush (NS) 0.9 % injection 3 mL  3 mL Intravenous Q12H Loleta Dicker, PA   3 mL at 01/25/23 [id number]   sodium chloride flush (NS) 0.9 % injection 3 mL  3 mL Intravenous PRN Loleta Dicker, PA       venlafaxine XR (EFFEXOR-XR) 24 hr capsule 150 mg  150 mg Oral Daily Loleta Dicker, PA   150 mg at 01/25/23 [id number]     Discharge Medications: Please see discharge summary for a list of discharge medications.  Relevant Imaging Results:  Relevant Lab Results:   Additional Information SS#[id number]  Laurena Slimmer, RN

## 2023-01-26 DIAGNOSIS — M549 Dorsalgia, unspecified: Secondary | ICD-10-CM | POA: Diagnosis not present

## 2023-01-26 LAB — BASIC METABOLIC PANEL
Anion gap: 8 (ref 5–15)
BUN: 12 mg/dL (ref 8–23)
CO2: 24 mmol/L (ref 22–32)
Calcium: 7.2 mg/dL — ABNORMAL LOW (ref 8.9–10.3)
Chloride: 106 mmol/L (ref 98–111)
Creatinine, Ser: 0.61 mg/dL (ref 0.44–1.00)
GFR, Estimated: >60 mL/min (ref >60)
Glucose, Bld: 92 mg/dL (ref 70–99)
Potassium: 3.1 mmol/L — ABNORMAL LOW (ref 3.5–5.1)
Sodium: 138 mmol/L (ref 135–145)

## 2023-01-26 LAB — GLUCOSE, CAPILLARY: Glucose-Capillary: 81 mg/dL (ref 70–99)

## 2023-01-26 MED ORDER — LIDOCAINE 5 % EX PTCH: 1.0000 | MEDICATED_PATCH | CUTANEOUS | 0 refills | Status: DC

## 2023-01-26 MED ORDER — POTASSIUM CHLORIDE CRYS ER 20 MEQ PO TBCR
40.0000 meq | EXTENDED_RELEASE_TABLET | Freq: Once | ORAL | Status: AC
  Administered 2023-01-26: 40 meq via ORAL
  Filled 2023-01-26: qty 2

## 2023-01-26 MED ORDER — IBUPROFEN 400 MG PO TABS: 400.0000 mg | ORAL_TABLET | Freq: Four times a day (QID) | ORAL | 0 refills | Status: DC | PRN

## 2023-01-26 MED ORDER — METHOCARBAMOL 500 MG PO TABS: 500.0000 mg | ORAL_TABLET | Freq: Four times a day (QID) | ORAL | 0 refills | Status: DC | PRN

## 2023-01-26 MED ORDER — APIXABAN 5 MG PO TABS: 5.0000 mg | ORAL_TABLET | Freq: Two times a day (BID) | ORAL | 0 refills | Status: AC

## 2023-01-26 MED ORDER — DIVALPROEX SODIUM 250 MG PO DR TAB: 250.0000 mg | DELAYED_RELEASE_TABLET | Freq: Two times a day (BID) | ORAL | 0 refills | Status: DC

## 2023-01-26 NOTE — Care Management Important Message (Signed)
Important Message  Patient Details  Name: Caroline Zamora MRN: [id number] Date of Birth: August 12, 1941   Medicare Important Message Given:  Yes     Juliann Pulse A [place]aundra Takacs 01/26/2023, 12:16 PM

## 2023-01-26 NOTE — TOC Transition Note (Addendum)
Transition of Care [facility]) - Progression Note    Patient Details  Name: Caroline Zamora MRN: [id number] Date of Birth: [birth year]  [facility] CM/SW Contact  Laurena Slimmer, RN Phone Number: 01/26/2023, 1:40 PM  Clinical Narrative:    Spoke with patient at her bedside. Called her son while in the room. Patient and her son have decidieed they would like for her to go home with [facility].  They are requested [facility] as she has been active with them before.  Referral sent and accepted by Judson Roch at [facility] will likely be Monday. Patient son and MD notified.   Request for [facility] sent to [employer].    TOC signing off.    Expected Discharge Plan: [place] Barriers to Discharge: Continued Medical Work up  Expected Discharge Plan and Services   Discharge Planning Services: CM Consult   Living arrangements for the past 2 months: Single Family Home Expected Discharge Date: 01/26/23               DME Arranged: N/A                     Social Determinants of Health (SDOH) Interventions SDOH Screenings   Food Insecurity: No Food Insecurity (01/21/2023)  Housing: Low Risk  (01/21/2023)  Transportation Needs: No Transportation Needs (01/21/2023)  Utilities: Not At Risk (01/21/2023)  Tobacco Use: Low Risk  (01/24/2023)    Readmission Risk Interventions     No data to display

## 2023-01-26 NOTE — Progress Notes (Addendum)
Patient is not able to walk the distance required to go the bathroom, or he/she is unable to safely negotiate stairs required to access the bathroom.  A BSC will alleviate this problem.

## 2023-01-26 NOTE — Progress Notes (Signed)
Discharge instructions reviewed with patient including followup visits and new medications.  Understanding was verbalized and all questions were answered.  IV removed without complication; patient tolerated well.  Patient discharged home via wheelchair in stable condition escorted by volunteer staff.

## 2023-01-26 NOTE — Plan of Care (Signed)
  Problem: Education: Goal: Ability to verbalize activity precautions or restrictions will improve Outcome: Progressing   Problem: Skin Integrity: Goal: Risk for impaired skin integrity will decrease Outcome: Progressing   Problem: Pain Managment: Goal: General experience of comfort will improve Outcome: Progressing   Problem: Elimination: Goal: Will not experience complications related to bowel motility Outcome: Progressing

## 2023-01-26 NOTE — Progress Notes (Signed)
Physical Therapy Treatment Patient Details Name: Caroline Zamora MRN: [id number] DOB: 1941/10/29 Today's Date: 01/26/2023   History of Present Illness 82 y.o. female with medical history significant of hypertension, hyperlipidemia, diastolic CHF, hypothyroidism, depression with anxiety, CKD 3A, PAF on Eliquis, colon cancer (s/p of surgery), orthostatic hypotension, who presents with frequent fall and back pain and was found to have T12 burst fracture, s/p T10-L2 decompression and fusion 2/16; post-op delirium. PMH: Afib, HTN, CKD, depression, migraines, and arthritis.    PT Comments    Pt is declining initial recommendation to d/c to SNF and requesting to go home. Her cognitive status has improved and c/o dizziness have subsided. Pt was able to tolerate 46f of gait training with RW and CG/S. She did struggle a bit on the stairs requiring ModA at one point to prevent LOB, however pt has a ramp to enter her home, w/c, and her son & sister will be staying with her on the first floor. Pt will benefit from HHPT upon d/c and plans to transition to out-pt at [facility]   Recommendations for follow up therapy are one component of a multi-disciplinary discharge planning process, led by the attending physician.  Recommendations may be updated based on patient status, additional functional criteria and insurance authorization.  Follow Up Recommendations  Home health PT Can patient physically be transported by private vehicle: Yes   Assistance Recommended at Discharge Intermittent Supervision/Assistance  Patient can return home with the following A little help with walking and/or transfers;A little help with bathing/dressing/bathroom;Assist for transportation;Help with stairs or ramp for entrance   Equipment Recommendations  BSC/3in1    Recommendations for Other Services       Precautions / Restrictions Precautions Precautions: Back Precaution Booklet Issued: No Required Braces or Orthoses: Spinal  Brace Spinal Brace: Thoracolumbosacral orthotic;Applied in sitting position Restrictions Weight Bearing Restrictions: No     Mobility  Bed Mobility Overal bed mobility: Needs Assistance Bed Mobility: Rolling, Sidelying to Sit, Supine to Sit, Sit to Supine Rolling: Modified independent (Device/Increase time) Sidelying to sit: Supervision Supine to sit: Supervision     General bed mobility comments:  (No c/o pain with bed mobility)    Transfers Overall transfer level: Needs assistance Equipment used: Rolling walker (2 wheels) Transfers: Sit to/from Stand Sit to Stand: Min guard           General transfer comment: MinA for lower surfaces    Ambulation/Gait Ambulation/Gait assistance: Min guard Gait Distance (Feet): 70 Feet Assistive device: Rolling walker (2 wheels) Gait Pattern/deviations: Step-to pattern Gait velocity: dec     General Gait Details:  (No dizziness in standing, LE's began to feel weak requiring a seated rest to prevent R knee buckling)   Stairs Stairs: Yes Stairs assistance: Mod assist Stair Management: One rail Right, Step to pattern Number of Stairs: 4 General stair comments:  (Pt struggled to manage the steps, requiring ModA to prevent buckling. Pt has a ramp at home to enter and a wheelchair.)   Wheelchair Mobility    Modified Rankin (Stroke Patients Only)       Balance Overall balance assessment: Needs assistance Sitting-balance support: No upper extremity supported Sitting balance-Leahy Scale: Good     Standing balance support: Bilateral upper extremity supported, During functional activity, Reliant on assistive device for balance Standing balance-Leahy Scale: Fair                              Cognition Arousal/Alertness:  Awake/alert Behavior During Therapy: WFL for tasks assessed/performed Overall Cognitive Status: Within Functional Limits for tasks assessed Area of Impairment: Safety/judgement, Awareness                          Safety/Judgement: Decreased awareness of safety, Decreased awareness of deficits     General Comments: Pt is clearing cognitively, however has some occasions of short term memory deficits.        Exercises General Exercises - Lower Extremity Ankle Circles/Pumps: AROM, Strengthening, 10 reps Long Arc Quad: Strengthening, 10 reps, Both Heel Slides: Strengthening, 10 reps    General Comments General comments (skin integrity, edema, etc.):  (Incision dressing removed and d/c'd, however was still draining from a few spots. MD notified.)      Pertinent Vitals/Pain Pain Assessment Pain Assessment: 0-10 Pain Score: 2  Pain Location: low back Pain Descriptors / Indicators: Discomfort Pain Intervention(s): Premedicated before session    Home Living                          Prior Function            PT Goals (current goals can now be found in the care plan section) Acute Rehab PT Goals Patient Stated Goal:  (I want to go home) Progress towards PT goals: Progressing toward goals    Frequency    7X/week      PT Plan Discharge plan needs to be updated;Equipment recommendations need to be updated    Co-evaluation              AM-PAC PT "6 Clicks" Mobility   Outcome Measure  Help needed turning from your back to your side while in a flat bed without using bedrails?: A Little Help needed moving from lying on your back to sitting on the side of a flat bed without using bedrails?: A Little Help needed moving to and from a bed to a chair (including a wheelchair)?: A Little Help needed standing up from a chair using your arms (e.g., wheelchair or bedside chair)?: A Little Help needed to walk in hospital room?: A Lot Help needed climbing 3-5 steps with a railing? : Total 6 Click Score: 15    End of Session Equipment Utilized During Treatment: Gait belt Activity Tolerance: Patient tolerated treatment well Patient left: in chair;with  call bell/phone within reach Nurse Communication: Mobility status (Drainage noted at incision) PT Visit Diagnosis: Muscle weakness (generalized) (M62.81);Difficulty in walking, not elsewhere classified (R26.2);Pain;Unsteadiness on feet (R26.81) Pain - part of body:  (Back)     Time: [id number] PT Time Calculation (min) (ACUTE ONLY): 42 min  Charges:  $Gait Training: 8-22 mins $Therapeutic Exercise: 8-22 mins $Therapeutic Activity: 8-22 mins                    Mikel Cella, PTA  Josie Dixon 01/26/2023, 2:25 PM

## 2023-01-26 NOTE — Progress Notes (Signed)
Progress Note  History: Caroline Zamora is here for T12 fracture after multiple falls.  POD7: Reports good pain control and denies any complaints this morning  POD6: have episodes of HTN when getting up with PT this morning.   POD5: Pt reports improvement today.   POD4: pt is drowsy this morning but reports reasonable pain control.  POD3: Doing much better.  She had significant delirium over the weekend.  She is now doing much better.  She unfortunately did decline physical therapy today.  HD3: Pt reports continued severe back pain with significant pain in her legs, particularly her right thigh  HD2: During her bath today, the patient developed worsening pain in her back.  She is now unable to achieve any sort of pain relief when she moves.  She also has some cramping in her lower extremities that was not previously present.  She is able to feel her perineum and has control over her bowels and bladder function.  That being said, she has some cramping that was not previously present.  Her pain is quite different than it was prior to this event today.  Physical Exam: Vitals:   01/25/23 2345 01/26/23 0746  BP: (!) 157/75 (!) 140/60  Pulse: 76 69  Resp: 18 18  Temp: 99.1 F (37.3 C) 98.2 F (36.8 C)  SpO2: 96% 95%    A&Ox3 CNI  Strength: MAEW. Pt sitting up at bedside with PT Incision: with clean dressing in place.  Data:  Other tests/results:  Xrays 01/17/2023 IMPRESSION: 1. Unchanged severe T12 burst fracture.     Electronically Signed   By: Titus Dubin M.D.   On: 01/17/2023 15:21  Please note that the comparison study for this x-ray was her MRI scan from yesterday.  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  On her standing x-rays today, that kyphosis has increased to 30 degrees.  MRI TL spine 01/16/2023 IMPRESSION: 1. Subacute T12 body fracture with advanced  height loss that is progressed from 12/11/2022 CT. Also progressed is retropulsion which causes lower cord compression. 2. Chronic/degenerative changes as described.     Electronically Signed   By: Jorje Guild M.D.   On: 01/16/2023 05:39    Assessment/Plan:  Caroline Zamora is doing much better after her delirium has improved.  She is doing well after T10-L2 intervention for unstable T12 burst fracture, thoracic stenosis, and thoracic kyphosis.  - PTOT - OK to wear brace OOB as needed - removed drain and wound vac on 2/21.  Seen 2/23 and wound left open to air. - pt to follow up with neurosurgery in 2 weeks - Eliquis ok to restart on 02/02/2023.  - remainder of care per primary team.  Cooper Render PA-C Department of Neurosurgery

## 2023-01-26 NOTE — TOC Progression Note (Signed)
Transition of Care [facility]) - Progression Note    Patient Details  Name: Caroline Zamora MRN: [id number] Date of Birth: Mar 17, 1941  [facility] CM/SW Contact  Laurena Slimmer, RN Phone Number: 01/26/2023, 10:03 AM  Clinical Narrative:    Attempt to reach patient's son to give bed offer for [facility]. No answer. Left a message.    Expected Discharge Plan: [place] Barriers to Discharge: Continued Medical Work up  Expected Discharge Plan and Services   Discharge Planning Services: CM Consult   Living arrangements for the past 2 months: Single Family Home                 DME Arranged: N/A                     Social Determinants of Health (SDOH) Interventions SDOH Screenings   Food Insecurity: No Food Insecurity (01/21/2023)  Housing: Low Risk  (01/21/2023)  Transportation Needs: No Transportation Needs (01/21/2023)  Utilities: Not At Risk (01/21/2023)  Tobacco Use: Low Risk  (01/24/2023)    Readmission Risk Interventions     No data to display

## 2023-01-26 NOTE — Discharge Summary (Addendum)
Physician Discharge Summary   Patient: Caroline Zamora MRN: [id number] DOB: 18-Aug-1941  Admit date:     01/16/2023  Discharge date: 01/26/23  Discharge Physician: Elmarie Shiley   PCP: Kirk Ruths, MD   Recommendations at discharge:   Needs to follow up with cardiology for orthostatic Hypotension FU with Neurosurgery post Sx care in 4 weeks.    Discharge Diagnoses: Principal Problem:   Intractable back pain Active Problems:   Closed unstable burst fracture of T12 vertebra (HCC)   Fall at home, initial encounter   Atrial fibrillation, chronic (HCC)   HTN (hypertension)   Chronic kidney disease, stage 3a (HCC)   Hypokalemia   Hypophosphatemia   Chronic diastolic CHF (congestive heart failure) (HCC)   Depression with anxiety   Hypothyroidism   Orthostatic hypotension   Thoracic spine instability   Spinal stenosis, thoracic   Other secondary kyphosis, thoracic region   Acute delirium  Resolved Problems:   * No resolved hospital problems. *  Hospital Course: 82 year old with past medical history significant for hypertension, hyperlipidemia, diastolic heart failure, hypothyroidism, depression with anxiety, CKD 3A, PAF, on Eliquis, colon cancer s/p surgery, orthostatic hypotension who presents with frequent fall and back pain and was found to have T12 burst fracture.   2/13: Neurosurgery consulted 2/15: Transfer to stepdown and intensivist consulted.  Dilaudid pump started, Lyrica 25 mg 3 times daily and 1 dose of Decadron 2/16: Underwent T10-L2 posterior fusion 2/17: Precedex started, psych consulted 2/18: Off Precedex.  Started Seroquel and Depakote for mood.  Transfer to floor 2/19: Less agitated, still has one-to-one sitter as she requires close monitoring for her behavioral and safety 2/20: Her mental status is back to baseline.  Delirium seems to have resolved.  Stopped all narcotics and also discontinued Seroquel.      Assessment and Plan: 1-Acute  metabolic encephalopathy, and acute ICU delirium In the setting of hospitalization, uncontrolled pain, medications. He required Precedex drip.  Narcotics Seroquel discontinued. On Depakote   2-Intractable back pain due to closed fracture of T12 vertebra -MRI showed subacute T12 body fracture with advanced height loss that is progressed from 9/80/2024.  Also progressing retropulsion which cause lower cord compression. -Repeated x-ray on 3/14 showed worsening of T12 burst fracture with evidence of instability with kyphosis now measuring 30 degrees versus 18 degrees on initial CT scan last month. -Underwent T 10-L2 posterior fusion on 12/16. -Neurosurgery following  Drain removed 2/21. Remove dressing 2/23 -Continue with pain management     3-Hypokalemia and hypophosphatemia: Replace Replete oral potassium    Fall at home, initial encounter: PT, OT consulted.  She will need rehab Patient Agrees with  rehab.    A-fib chronic: Surgery okay to resume Eliquis 02/02/2023   HTN: As needed hydralazine   CKD stage IIIa: Monitor   Chronic diastolic heart failure: Compensated Depression with anxiety continue with Effexor Hypothyroidism: Continue with Synthroid Orthostatic hypotension: Florinef.  Present outpatient by Dr. Caryl Comes.  This medication has been discontinued BP not significantly low. Discussed with Dr Caryl Comes no need to start amantadine.   Difficulty with IV access -She had a very poor access and finally IV team was able to establish midline on 2/18   Estimated body mass index is 29.05 kg/m as calculated from the following:   Height as of this encounter: '5\' 6"'$  (1.676 m).   Weight as of this encounter: 81.6 kg.          Consultants: Neurosurgery  Procedures performed: None Disposition:  Home vs Rehab Diet recommendation:  Discharge Diet Orders (From admission, onward)     Start     Ordered   01/26/23 0000  Diet - low sodium heart healthy        01/26/23 1208            Cardiac diet DISCHARGE MEDICATION: Allergies as of 01/26/2023       Reactions   Hydromorphone Other (See Comments)   Significant confusion and agitation   Desyrel [trazodone] Rash   Sulfa Antibiotics Rash        Medication List     STOP taking these medications    fludrocortisone 0.1 MG tablet Commonly known as: FLORINEF   pregabalin 50 MG capsule Commonly known as: LYRICA       TAKE these medications    acetaminophen 500 MG tablet Commonly known as: TYLENOL Take 500 mg by mouth every 6 (six) hours as needed for mild pain.   amiodarone 200 MG tablet Commonly known as: PACERONE Take 200 mg by mouth daily.   apixaban 5 MG Tabs tablet Commonly known as: ELIQUIS Take 1 tablet (5 mg total) by mouth 2 (two) times daily. Start taking on: February 02, 2023 What changed: These instructions start on February 02, 2023. If you are unsure what to do until then, ask your doctor or other care provider.   divalproex 250 MG DR tablet Commonly known as: DEPAKOTE Take 1 tablet (250 mg total) by mouth every 12 (twelve) hours.   estradiol 0.5 MG tablet Commonly known as: ESTRACE Take 0.5 mg by mouth at bedtime.   levothyroxine 50 MCG tablet Commonly known as: SYNTHROID Take 50 mcg by mouth daily before breakfast.   lidocaine 5 % Commonly known as: LIDODERM Place 1 patch onto the skin daily. Remove & Discard patch within 12 hours or as directed by MD Start taking on: January 27, 2023   methocarbamol 500 MG tablet Commonly known as: ROBAXIN Take 1 tablet (500 mg total) by mouth every 6 (six) hours as needed for muscle spasms.   venlafaxine XR 150 MG 24 hr capsule Commonly known as: EFFEXOR-XR Take 150 mg by mouth daily.               Durable Medical Equipment  (From admission, onward)           Start     Ordered   01/26/23 1432  For home use only DME Bedside commode  Once       Question Answer Comment  Patient needs a bedside commode to treat with the  following condition Back pain   Patient needs a bedside commode to treat with the following condition Balance disorder      01/26/23 1431              Discharge Care Instructions  (From admission, onward)           Start     Ordered   01/26/23 0000  Discharge wound care:       Comments: See above   01/26/23 1208            Follow-up Information     Loleta Dicker, PA Follow up on 02/06/2023.   Specialty: Neurosurgery Contact information: [address] [place] [address] [phone]         Kirk Ruths, MD Follow up in 1 week(s).   Specialty: Internal Medicine Contact information: [facility] [place] [address]  [phone]                Discharge Exam: Danley Danker Weights   01/16/23 0443  Weight: 81.6 kg  General; NAD  Condition at discharge: stable  The results of significant diagnostics from this hospitalization (including imaging, microbiology, ancillary and laboratory) are listed below for reference.   Imaging Studies: [place] EKG SITE RITE  Result Date: 01/21/2023 If Site Rite image not attached, placement could not be confirmed due to current cardiac rhythm.  DG Abd 1 View  Result Date: 01/20/2023 CLINICAL DATA:  NG tube placement. EXAM: ABDOMEN - 1 VIEW COMPARISON:  None Available. FINDINGS: NG tube is in place with the tip and side-port in the stomach. IMPRESSION: NG tube in good position. Electronically Signed   By: Inge Rise M.D.   On: 01/20/2023 10:07   DG Lumbar Spine 2-3 Views  Result Date: 01/19/2023 CLINICAL DATA:  Surgical spinal fusion. EXAM: LUMBAR SPINE - 2-3 VIEW; DG C-ARM 1-60 MIN-NO REPORT Radiation exposure index: 73.98 mGy. COMPARISON:  January 17, 2023. FINDINGS: Six intraoperative fluoroscopic images were obtained of the thoracolumbar spine. These images demonstrate surgical posterior fusion extending from T10-L2 with bilateral intrapedicular screw  placement at T10, T11, L1 and L2. IMPRESSION: Fluoroscopic guidance provided during surgical posterior fusion of thoracolumbar spine as described above. Electronically Signed   By: Marijo Conception M.D.   On: 01/19/2023 15:00   DG C-Arm 1-60 Min-No Report  Result Date: 01/19/2023 Fluoroscopy was utilized by the requesting physician.  No radiographic interpretation.   DG Chest Port 1 View  Result Date: 01/18/2023 CLINICAL DATA:  Aspiration pneumonitis EXAM: PORTABLE CHEST 1 VIEW COMPARISON:  Chest x-ray dated December 28, 2022 FINDINGS: The heart size and mediastinal contours are within normal limits. Both lungs are clear. No evidence of pleural effusion or pneumothorax. External hardware overlying the thoracic spine. IMPRESSION: No active disease. Electronically Signed   By: Yetta Glassman M.D.   On: 01/18/2023 13:45   DG THORACOLUMABAR SPINE  Addendum Date: 01/17/2023   ADDENDUM REPORT: 01/17/2023 16:34 ADDENDUM: Focal kyphosis at T12 currently measures 31 degrees, previously 18 degrees on the prior CT from a month ago. Electronically Signed   By: Titus Dubin M.D.   On: 01/17/2023 16:34   Result Date: 01/17/2023 CLINICAL DATA:  T12 burst fracture. EXAM: THORACOLUMBAR SPINE 1V COMPARISON:  MRI thoracic spine from yesterday. FINDINGS: Unchanged severe T12 burst fracture with bony retropulsion and focal kyphosis. No new fracture. Similar degenerative changes of the lumbar spine. Chronic right posterior ninth and tenth rib fractures. IMPRESSION: 1. Unchanged severe T12 burst fracture. Electronically Signed: By: Titus Dubin M.D. On: 01/17/2023 15:21   MR THORACIC SPINE WO CONTRAST  Result Date: 01/16/2023 CLINICAL DATA:  Recurrent back pain for 6 months. EXAM: MRI THORACIC AND LUMBAR SPINE WITHOUT CONTRAST TECHNIQUE: Multiplanar and multiecho pulse sequences of the thoracic and lumbar spine were obtained without intravenous contrast. COMPARISON:  12/11/2022 thoracic spine CT FINDINGS: MRI  THORACIC SPINE FINDINGS Alignment:  Mild spinal curvature.  No traumatic listhesis. Vertebrae: Subacute T12 fracture with advanced height loss that is progressed from before. Posterosuperior corner retropulsion compressing the lower cord. Cord: Lower cord compression as described above. No visible cord edema. Paraspinal and other soft tissues: Perivertebral edema and strain at the level of T12 fracture. Disc levels: Notable degenerative change: T3-4 small right paracentral herniation with ventral cord contact. T5-6 small central disc protrusion that is shallow. MRI LUMBAR SPINE FINDINGS Segmentation:  5 lumbar type vertebrae  Alignment:  Unremarkable Vertebrae:  No fracture, evidence of discitis, or bone lesion. Conus medullaris and cauda equina: Conus extends to the L1 level. Conus and cauda equina appear normal. Paraspinal and other soft tissues: Negative for paravertebral mass or inflammation Disc levels: L1-L2: Ventral spondylitic spurring.  Mild disc bulging. L2-L3: Disc narrowing and bulging with bilateral inferior foraminal herniation. Patent canal and foramina L3-L4: Degenerative facet spurring with ligamentum flavum thickening. The disc is narrowed and circumferentially bulging. Noncompressive thecal sac narrowing L4-L5: Disc narrowing with endplate and facet spurring bilaterally. The canal and foramina are patent L5-S1:Disc bulging eccentric to the left. Moderate degenerative facet spurring. IMPRESSION: 1. Subacute T12 body fracture with advanced height loss that is progressed from 12/11/2022 CT. Also progressed is retropulsion which causes lower cord compression. 2. Chronic/degenerative changes as described. Electronically Signed   By: Jorje Guild M.D.   On: 01/16/2023 05:39   MR LUMBAR SPINE WO CONTRAST  Result Date: 01/16/2023 CLINICAL DATA:  Recurrent back pain for 6 months. EXAM: MRI THORACIC AND LUMBAR SPINE WITHOUT CONTRAST TECHNIQUE: Multiplanar and multiecho pulse sequences of the thoracic  and lumbar spine were obtained without intravenous contrast. COMPARISON:  12/11/2022 thoracic spine CT FINDINGS: MRI THORACIC SPINE FINDINGS Alignment:  Mild spinal curvature.  No traumatic listhesis. Vertebrae: Subacute T12 fracture with advanced height loss that is progressed from before. Posterosuperior corner retropulsion compressing the lower cord. Cord: Lower cord compression as described above. No visible cord edema. Paraspinal and other soft tissues: Perivertebral edema and strain at the level of T12 fracture. Disc levels: Notable degenerative change: T3-4 small right paracentral herniation with ventral cord contact. T5-6 small central disc protrusion that is shallow. MRI LUMBAR SPINE FINDINGS Segmentation:  5 lumbar type vertebrae Alignment:  Unremarkable Vertebrae:  No fracture, evidence of discitis, or bone lesion. Conus medullaris and cauda equina: Conus extends to the L1 level. Conus and cauda equina appear normal. Paraspinal and other soft tissues: Negative for paravertebral mass or inflammation Disc levels: L1-L2: Ventral spondylitic spurring.  Mild disc bulging. L2-L3: Disc narrowing and bulging with bilateral inferior foraminal herniation. Patent canal and foramina L3-L4: Degenerative facet spurring with ligamentum flavum thickening. The disc is narrowed and circumferentially bulging. Noncompressive thecal sac narrowing L4-L5: Disc narrowing with endplate and facet spurring bilaterally. The canal and foramina are patent L5-S1:Disc bulging eccentric to the left. Moderate degenerative facet spurring. IMPRESSION: 1. Subacute T12 body fracture with advanced height loss that is progressed from 12/11/2022 CT. Also progressed is retropulsion which causes lower cord compression. 2. Chronic/degenerative changes as described. Electronically Signed   By: Jorje Guild M.D.   On: 01/16/2023 05:39   DG Hip Unilat  With Pelvis 2-3 Views Right  Result Date: 12/28/2022 CLINICAL DATA:  Falling a lot EXAM: DG  HIP (WITH OR WITHOUT PELVIS) 2-3V RIGHT COMPARISON:  None Available. FINDINGS: Osseous demineralization. Hip and SI joint spaces preserved. No acute fracture, dislocation, or bone destruction. Scattered pelvic phleboliths. Degenerative disc disease changes L4-L5. IMPRESSION: No acute RIGHT hip abnormalities. Electronically Signed   By: Lavonia Dana M.D.   On: 12/28/2022 13:11   DG Chest 2 View  Result Date: 12/28/2022 CLINICAL DATA:  Weakness, falling a lot EXAM: CHEST - 2 VIEW COMPARISON:  07/04/2019 Correlation: CT thoracic spine 12/11/2022 FINDINGS: Normal heart size, mediastinal contours, and pulmonary vascularity. Small hiatal hernia incidentally noted. Lungs clear. No pulmonary infiltrate, pleural effusion, or pneumothorax. Osseous demineralization with marked compression deformity of a vertebra at the thoracolumbar junction increased since 12/11/2022, associated with  mild focal kyphosis IMPRESSION: Small hiatal hernia. No acute abnormalities. Increased compression deformity of a thoracic vertebra at the thoracolumbar junction with mild associated focal kyphosis. Electronically Signed   By: Lavonia Dana M.D.   On: 12/28/2022 13:11   CT HEAD WO CONTRAST (5MM)  Result Date: 12/28/2022 CLINICAL DATA:  Provided history: Head trauma, moderate/severe. Multiple falls. EXAM: CT HEAD WITHOUT CONTRAST TECHNIQUE: Contiguous axial images were obtained from the base of the skull through the vertex without intravenous contrast. RADIATION DOSE REDUCTION: This exam was performed according to the departmental dose-optimization program which includes automated exposure control, adjustment of the mA and/or kV according to patient size and/or use of iterative reconstruction technique. COMPARISON:  Head CT 12/11/2022.  Brain MRI 07/06/2019. FINDINGS: Brain: Mild generalized cerebral atrophy. Redemonstrated prominent perivascular space within the left subinsular white matter. There is no acute intracranial hemorrhage. No  demarcated cortical infarct. No extra-axial fluid collection. No evidence of an intracranial mass. No midline shift. Vascular: No hyperdense vessel.  Atherosclerotic calcifications. Skull: No fracture or aggressive osseous lesion. Sinuses/Orbits: No mass or acute finding within the imaged orbits. Small fluid level within the right maxillary sinus at the imaged levels. Other: Small right anterior scalp hematoma. IMPRESSION: 1. No evidence of acute intracranial abnormality. 2. Small right anterior scalp hematoma. 3. Mild generalized cerebral atrophy. 4. Right maxillary sinusitis. Electronically Signed   By: Kellie Simmering D.O.   On: 12/28/2022 13:00    Microbiology: Results for orders placed or performed during the hospital encounter of 01/16/23  Surgical PCR screen     Status: None   Collection Time: 01/18/23 11:51 AM   Specimen: Nasal Mucosa; Nasal Swab  Result Value Ref Range Status   MRSA, PCR NEGATIVE NEGATIVE Final   Staphylococcus aureus NEGATIVE NEGATIVE Final    Comment: (NOTE) The Xpert SA Assay (FDA approved for NASAL specimens in patients 39 years of age and older), is one component of a comprehensive surveillance program. It is not intended to diagnose infection nor to guide or monitor treatment. Performed at [facility], [address] [place], [place] [address]     Labs: CBC: Recent Labs  Lab 01/21/23 0443 01/22/23 1820 01/24/23 0627  WBC 8.0 8.0 8.3  NEUTROABS 5.9  --   --   HGB 9.8* 9.9* 9.4*  HCT 30.6* 30.9* 28.6*  MCV 93.9 93.1 90.8  PLT 221 232 0000000   Basic Metabolic Panel: Recent Labs  Lab 01/21/23 0443 01/22/23 1820 01/24/23 0627 01/26/23 0654  NA 139  --  135 138  K 3.0*  --  3.0* 3.1*  CL 109  --  107 106  CO2 21*  --  24 24  GLUCOSE 83  --  90 92  BUN 32*  --  16 12  CREATININE 0.89 0.87 0.68 0.61  CALCIUM 7.2*  --  7.2* 7.2*  MG 2.2  --   --   --   PHOS 2.2*  --   --   --    Liver Function Tests: No results for input(s): "AST",  "ALT", "ALKPHOS", "BILITOT", "PROT", "ALBUMIN" in the last 168 hours. CBG: Recent Labs  Lab 01/21/23 0752 01/22/23 0757 01/23/23 0837 01/24/23 0831 01/26/23 0747  GLUCAP 70 84 125* 91 81    Discharge time spent: greater than 30 minutes.  Signed: Elmarie Shiley, MD [facility] 01/26/2023

## 2023-02-06 ENCOUNTER — Encounter: Payer: Self-pay | Admitting: Neurosurgery

## 2023-02-06 ENCOUNTER — Ambulatory Visit (INDEPENDENT_AMBULATORY_CARE_PROVIDER_SITE_OTHER): Payer: Medicare HMO | Admitting: Neurosurgery

## 2023-02-06 VITALS — BP 130/68 | Temp 98.3°F | Ht 66.0 in | Wt 180.0 lb

## 2023-02-06 DIAGNOSIS — M4804 Spinal stenosis, thoracic region: Secondary | ICD-10-CM

## 2023-02-06 DIAGNOSIS — Z981 Arthrodesis status: Secondary | ICD-10-CM

## 2023-02-06 DIAGNOSIS — M532X4 Spinal instabilities, thoracic region: Secondary | ICD-10-CM

## 2023-02-06 DIAGNOSIS — M4014 Other secondary kyphosis, thoracic region: Secondary | ICD-10-CM

## 2023-02-06 DIAGNOSIS — Z09 Encounter for follow-up examination after completed treatment for conditions other than malignant neoplasm: Secondary | ICD-10-CM

## 2023-02-06 DIAGNOSIS — S22082D Unstable burst fracture of T11-T12 vertebra, subsequent encounter for fracture with routine healing: Secondary | ICD-10-CM

## 2023-02-06 NOTE — Progress Notes (Signed)
   REFERRING PHYSICIAN:  Kirk Ruths, [place],   [id number]  DOS: 01/19/23 T10-L2 fusion. Orif T12 fracture. T12 laminectomy   HISTORY OF PRESENT ILLNESS: Caroline Zamora is approximately 2 weeks status post thoracolumbar fusion for fracture. she is doing relatively well postoperatively.  She endorses some left leg weakness which has been present since prior to surgery and is likely due to deconditioning.  Overall she is tolerating things very well and only taking Tylenol for pain.  She continues to work with home physical therapy.  PHYSICAL EXAMINATION:  General: Patient is well developed, well nourished, calm, collected, and in no apparent distress.   NEUROLOGICAL:  General: In no acute distress.   Awake, alert, oriented to person, place, and time.  Pupils equal round and reactive to light.  Facial tone is symmetric.  Tongue protrusion is midline.  There is no pronator drift.  Strength:            Side Iliopsoas Quads Hamstring PF DF EHL  R '5 5 5 5 5 5  '$ L '5 5 5 5 5 5   '$ Incision c/d/I and healing well   ROS (Neurologic):  Negative except as noted above  IMAGING: No interval imaging to review  ASSESSMENT/PLAN:  Caroline Zamora is doing well approximately 2 weeks after spinal fusion.  I encouraged her to continue work with physical therapy and do home exercises as she is overall deconditioned from the amount of time she spent nonambulatory.  We discussed activity escalation and I have advised the patient to lift up to 10 pounds until 6 weeks after surgery, then increase up to 25 pounds until 12 weeks after surgery.  After 12 weeks post-op, the patient advised to increase activity as tolerated.  she will follow up 4 weeks with xrays or sooner should she have any questions or concerns. Advised to contact the office if any questions or concerns arise.  Caroline Render PA-C Department of neurosurgery

## 2023-02-07 ENCOUNTER — Encounter: Payer: Self-pay | Admitting: Neurosurgery

## 2023-02-08 ENCOUNTER — Other Ambulatory Visit: Payer: Self-pay | Admitting: Internal Medicine

## 2023-02-08 ENCOUNTER — Ambulatory Visit: Payer: Medicare HMO | Admitting: Internal Medicine

## 2023-02-08 ENCOUNTER — Encounter: Payer: Self-pay | Admitting: Neurosurgery

## 2023-02-13 ENCOUNTER — Ambulatory Visit: Payer: Medicare HMO | Attending: Internal Medicine | Admitting: Internal Medicine

## 2023-02-13 ENCOUNTER — Encounter: Payer: Self-pay | Admitting: Internal Medicine

## 2023-02-13 VITALS — BP 140/80 | Wt 180.0 lb

## 2023-02-13 DIAGNOSIS — I493 Ventricular premature depolarization: Secondary | ICD-10-CM | POA: Insufficient documentation

## 2023-02-13 DIAGNOSIS — I4891 Unspecified atrial fibrillation: Secondary | ICD-10-CM

## 2023-02-13 DIAGNOSIS — I951 Orthostatic hypotension: Secondary | ICD-10-CM

## 2023-02-13 DIAGNOSIS — R55 Syncope and collapse: Secondary | ICD-10-CM | POA: Diagnosis not present

## 2023-02-13 NOTE — Progress Notes (Signed)
Electrophysiology TeleHealth Note      Date:  02/13/2023   ID:  Caroline Zamora, DOB Sep 24, 1941, MRN [id number]  Location: patient's home  Provider location: [address], [place]  Evaluation Performed: Follow-up visit  PCP:  Kirk Ruths, MD  Cardiologist:     Electrophysiologist:  SK   Chief Complaint:  dizziness   History of Present Illness:    Caroline Zamora is a 82 y.o. female who presents via audio/video conferencing for a telehealth visit today.  Since last being seen in our clinic for orthostatic syncope and recent hospitalization for burst fracture  the patient reports doing much better with less orthostatic lightheadedness and legs feel better and strongter     The patient denies symptoms of fevers, chills, cough, or new SOB worrisome for COVID 19.    Past Medical History:  Diagnosis Date   Arthritis    lumbar spine   CKD (chronic kidney disease), stage II    Colon cancer ([place])    Depression    Dysphagia    Headache    migraine   Hx of adenomatous colonic polyps    Hyperlipidemia    Hypertension    PAF (paroxysmal atrial fibrillation) (HCC)    Paroxysmal atrial fibrillation (HCC)    Skin cancer    Syncope    Type 2 diabetes mellitus ([place]) 12/11/2022    Past Surgical History:  Procedure Laterality Date   ABDOMINAL HYSTERECTOMY     with removal of tubes and ovaries   ANKLE FRACTURE SURGERY Right    COLONOSCOPY     COLONOSCOPY WITH PROPOFOL N/A 09/03/2017   Procedure: COLONOSCOPY WITH PROPOFOL;  Surgeon: Manya Silvas, MD;  Location: [facility] ENDOSCOPY;  Service: Endoscopy;  Laterality: N/A;   COLONOSCOPY WITH PROPOFOL N/A 09/13/2020   Procedure: COLONOSCOPY WITH PROPOFOL;  Surgeon: Lesly Rubenstein, MD;  Location: [facility] ENDOSCOPY;  Service: Endoscopy;  Laterality: N/A;   DILATION AND CURETTAGE OF UTERUS     ESOPHAGOGASTRODUODENOSCOPY     ESOPHAGOGASTRODUODENOSCOPY (EGD) WITH PROPOFOL N/A 09/13/2020   Procedure:  ESOPHAGOGASTRODUODENOSCOPY (EGD) WITH PROPOFOL;  Surgeon: Lesly Rubenstein, MD;  Location: [facility] ENDOSCOPY;  Service: Endoscopy;  Laterality: N/A;   LEFT HEART CATH AND CORONARY ANGIOGRAPHY N/A 04/17/2022   Procedure: LEFT HEART CATH AND CORONARY ANGIOGRAPHY;  Surgeon: Lorretta Harp, MD;  Location: [place] CV LAB;  Service: Cardiovascular;  Laterality: N/A;   skin cancer removal     TONSILLECTOMY     TUBAL LIGATION      Current Outpatient Medications  Medication Sig Dispense Refill   acetaminophen (TYLENOL) 500 MG tablet Take 500 mg by mouth every 6 (six) hours as needed for mild pain.     amiodarone (PACERONE) 200 MG tablet Take 200 mg by mouth in the morning.     apixaban (ELIQUIS) 5 MG TABS tablet Take 1 tablet (5 mg total) by mouth 2 (two) times daily. 60 tablet 0   estradiol (ESTRACE) 0.5 MG tablet Take 0.5 mg by mouth at bedtime.     levothyroxine (SYNTHROID) 50 MCG tablet Take 50 mcg by mouth daily before breakfast.     venlafaxine XR (EFFEXOR-XR) 150 MG 24 hr capsule Take 150 mg by mouth daily.     lidocaine (LIDODERM) 5 % Place 1 patch onto the skin daily. Remove & Discard patch within 12 hours or as directed by MD (Patient not taking: Reported on 02/13/2023) 30 patch 0   No current facility-administered medications  for this visit.    Allergies:   Hydromorphone, Desyrel [trazodone], and Sulfa antibiotics      ROS:  Please see the history of present illness.   All other systems are personally reviewed and negative.    Exam:    Vital Signs:  BP (!) 140/80   Wt 180 lb (81.6 kg)   LMP  (LMP Unknown)   BMI 29.05 kg/m  =     Labs/Other Tests and Data Reviewed:    Recent Labs: 12/12/2022: ALT 22 01/16/2023: B Natriuretic Peptide 30.6 01/21/2023: Magnesium 2.2; TSH 0.898 01/24/2023: Hemoglobin 9.4; Platelets 261 01/26/2023: BUN 12; Creatinine, Ser 0.61; Potassium 3.1; Sodium 138   Wt Readings from Last 3 Encounters:  02/13/23 180 lb (81.6 kg)  02/06/23 180 lb  (81.6 kg)  01/16/23 180 lb (81.6 kg)     Other studies personally reviewed: Additional studies/ records that were reviewed today include: hospital notes   Review of the above records today demonstrates:   Prior radiographs:      ASSESSMENT & PLAN:    Syncope abrupt in onset/offset   Orthostatic hypotension   Atrial fibrillation   PVCs-left bundle inferior axis   Dyspnea on exertion   Cardiomyopathy?   Incomplete right bundle branch block    Doing somewhat better following discharge and less dizziness  Leg strength is improving   Orthostatic BPs per PT were stable even over 3 min  No intervurrent atrial fib and tolerating amio      Follow-up:  47m  and cancel appt for 4/11     Current medicines are reviewed at length with the patient today.   The patient  concerns regarding her medicines.  The following changes were made today:  none  Labs/ tests ordered today include:   No orders of the defined types were placed in this encounter.     Today, I have spent  23 minutes with the patient with telehealth technology discussing the above.  Signed, Virl Axe, MD  02/13/2023 1:30 PM     [address] [place] [address] [phone] (office) [phone] (fax)

## 2023-02-13 NOTE — Patient Instructions (Addendum)
Medication Instructions:  Your physician recommends that you continue on your current medications as directed. Please refer to the Current Medication list given to you today.  *If you need a refill on your cardiac medications before your next appointment, please call your pharmacy*   Lab Work: None ordered.  If you have labs (blood work) drawn today and your tests are completely normal, you will receive your results only by: [place] (if you have MyChart) OR A paper copy in the mail If you have any lab test that is abnormal or we need to change your treatment, we will call you to review the results.   Testing/Procedures: None ordered.    Follow-Up: At [facility], you and your health needs are our priority.  As part of our continuing mission to provide you with exceptional heart care, we have created designated Provider Care Teams.  These Care Teams include your primary Cardiologist (physician) and Advanced Practice Providers (APPs -  Physician Assistants and Nurse Practitioners) who all work together to provide you with the care you need, when you need it.  We recommend signing up for the patient portal called "MyChart".  Sign up information is provided on this After Visit Summary.  MyChart is used to connect with patients for Virtual Visits (Telemedicine).  Patients are able to view lab/test results, encounter notes, upcoming appointments, etc.  Non-urgent messages can be sent to your provider as well.   To learn more about what you can do with MyChart, go to [email or web address].    Your next appointment:   4 months with Dr Caryl Comes - His scheduler will call you to set up this appointment.

## 2023-02-27 ENCOUNTER — Other Ambulatory Visit: Payer: Self-pay

## 2023-02-27 DIAGNOSIS — Z981 Arthrodesis status: Secondary | ICD-10-CM

## 2023-03-01 ENCOUNTER — Ambulatory Visit (INDEPENDENT_AMBULATORY_CARE_PROVIDER_SITE_OTHER): Payer: Medicare HMO | Admitting: Neurosurgery

## 2023-03-01 ENCOUNTER — Encounter: Payer: Self-pay | Admitting: Neurosurgery

## 2023-03-01 VITALS — BP 136/82 | Temp 97.9°F | Ht 66.0 in | Wt 180.0 lb

## 2023-03-01 DIAGNOSIS — M4804 Spinal stenosis, thoracic region: Secondary | ICD-10-CM

## 2023-03-01 DIAGNOSIS — S22082D Unstable burst fracture of T11-T12 vertebra, subsequent encounter for fracture with routine healing: Secondary | ICD-10-CM

## 2023-03-01 DIAGNOSIS — Z981 Arthrodesis status: Secondary | ICD-10-CM

## 2023-03-01 DIAGNOSIS — M4014 Other secondary kyphosis, thoracic region: Secondary | ICD-10-CM

## 2023-03-01 DIAGNOSIS — Z09 Encounter for follow-up examination after completed treatment for conditions other than malignant neoplasm: Secondary | ICD-10-CM

## 2023-03-01 NOTE — Progress Notes (Signed)
   REFERRING PHYSICIAN:  Kirk Ruths, Md [facility],  [place] [address]  DOS: 01/19/23 T10-L2 fusion. Orif T12 fracture. T12 laminectomy   HISTORY OF PRESENT ILLNESS: Caroline Zamora is status post thoracolumbar fusion for fracture. she is doing relatively well postoperatively.  She continues to do in-home therapy.  Her walking has been improving.   PHYSICAL EXAMINATION:  General: Patient is well developed, well nourished, calm, collected, and in no apparent distress.   NEUROLOGICAL:  General: In no acute distress.   Awake, alert, oriented to person, place, and time.  Pupils equal round and reactive to light.  Facial tone is symmetric.  Tongue protrusion is midline.  There is no pronator drift.  Strength:            Side Iliopsoas Quads Hamstring PF DF EHL  R 5 5 5 5 5 5   L 5 5 5 5 5 5    Incision c/d/I and healing well   ROS (Neurologic):  Negative except as noted above  IMAGING: No interval imaging to review  ASSESSMENT/PLAN:  Caroline Zamora is doing well after spinal fusion.  She will continue with physical and Occupational Therapy.  She will transition to outpatient therapy soon.  When she is ready to do that, I am happy to send a referral for her.  I think she will continue to improve for months.  Will see her back in clinic in approximately 6 weeks.  She is on a 25 pound lifting limit.     Meade Maw Department of neurosurgery

## 2023-03-02 ENCOUNTER — Telehealth: Payer: Self-pay | Admitting: Neurosurgery

## 2023-03-02 NOTE — Telephone Encounter (Signed)
T10- L2 fusion, ORIF T12 fracture, T12 laminectomy on 01/19/23/  Caroline Zamora with [facility] calling to report that the patient is struggling doing stairs. She reports thigh and buttock weakness. They usually don't do exercises with hips or back especially after surgery.  The weakness is really limiting her. Caroline Zamora is asking if Dr.Yarbrough wants them to to continue with PT/OT or wait. I read Dr.Yarbrough's Plan of Care to [facility] from pt's appointment yesterday. He said they will continue with PT/OT just like in Dr.Yarbrough's note.

## 2023-03-12 ENCOUNTER — Telehealth: Payer: Self-pay | Admitting: Internal Medicine

## 2023-03-12 NOTE — Telephone Encounter (Signed)
Caroline Zamora with [facility] called in stating pt's bp has been higher than normal over the last week. He states today it is 170/90 and she is starting to get a little SOB.   He states at her last hospital stay they discontinued her carvedilol so she is not taking any BP medication. Please advise.

## 2023-03-12 NOTE — Telephone Encounter (Signed)
Spoke with Home Health PT who reports pt has had increasing BP for the past week with reading today of 170/90.  He states pt was taken off BP medication during hospital stay in February.  Pt does not have a BP monitor at home but plans to get one.  He also feels pt is having some memory issues.   PT advised Dr Graciela Husbands has seen pt for orthostatic hypotension previously.  PT advised upon discharge from hospital instructions say: HTN- Hydralazine as needed but it looks like Hydralazine was removed from pt's medication list on 01/23/2023 while pt was still in the hospital.  Pt has canceled and rescheduled follow appointments with Dr Graciela Husbands x 2 and is now scheduled for 07/2023. PT states he plans to reach out to pt's PCP as well to discuss elevated BP and will have her schedule a follow up visit since she has not been evaluated by PCP since hospital discharge.

## 2023-03-15 ENCOUNTER — Ambulatory Visit: Payer: Medicare HMO | Admitting: Internal Medicine

## 2023-04-03 ENCOUNTER — Other Ambulatory Visit: Payer: Self-pay | Admitting: Physician Assistant

## 2023-04-03 DIAGNOSIS — R600 Localized edema: Secondary | ICD-10-CM

## 2023-04-05 ENCOUNTER — Ambulatory Visit
Admission: RE | Admit: 2023-04-05 | Discharge: 2023-04-05 | Disposition: A | Discharge status: Home / Self Care | Payer: Medicare HMO | Source: Ambulatory Visit | Attending: Physician Assistant | Admitting: Physician Assistant

## 2023-04-05 DIAGNOSIS — R600 Localized edema: Secondary | ICD-10-CM | POA: Insufficient documentation

## 2023-04-11 ENCOUNTER — Ambulatory Visit
Admission: RE | Admit: 2023-04-11 | Discharge: 2023-04-11 | Disposition: A | Discharge status: Home / Self Care | Payer: Medicare HMO | Attending: Neurosurgery | Admitting: Neurosurgery

## 2023-04-11 ENCOUNTER — Ambulatory Visit
Admission: RE | Admit: 2023-04-11 | Discharge: 2023-04-11 | Disposition: A | Discharge status: Home / Self Care | Payer: Medicare HMO | Source: Ambulatory Visit | Attending: Neurosurgery | Admitting: Neurosurgery

## 2023-04-11 DIAGNOSIS — Z981 Arthrodesis status: Secondary | ICD-10-CM

## 2023-04-12 ENCOUNTER — Ambulatory Visit: Payer: Medicare HMO | Admitting: Neurosurgery

## 2023-04-12 ENCOUNTER — Encounter: Payer: Self-pay | Admitting: Neurosurgery

## 2023-04-12 VITALS — BP 130/80 | Ht 66.0 in | Wt 180.0 lb

## 2023-04-12 DIAGNOSIS — Z981 Arthrodesis status: Secondary | ICD-10-CM

## 2023-04-12 DIAGNOSIS — S22082D Unstable burst fracture of T11-T12 vertebra, subsequent encounter for fracture with routine healing: Secondary | ICD-10-CM

## 2023-04-12 DIAGNOSIS — M4014 Other secondary kyphosis, thoracic region: Secondary | ICD-10-CM

## 2023-04-12 DIAGNOSIS — Z09 Encounter for follow-up examination after completed treatment for conditions other than malignant neoplasm: Secondary | ICD-10-CM

## 2023-04-12 DIAGNOSIS — M4804 Spinal stenosis, thoracic region: Secondary | ICD-10-CM

## 2023-04-12 NOTE — Progress Notes (Signed)
   REFERRING PHYSICIAN:  Lauro Regulus, Md [address] [facility] [place] [place],  [place] [address]  DOS: 01/19/23 T10-L2 fusion ORIF T12 fracture. T12 laminectomy   HISTORY OF PRESENT ILLNESS: Caroline Zamora is approximately 3 months status post lumbar fusion for fracture. she is doing well and states she often forgets that she even had surgery on her back.  She denies any significant concerns.  She is currently pending a surgery on her right ankle for loose screws.  PHYSICAL EXAMINATION:  General: Patient is well developed, well nourished, calm, collected, and in no apparent distress.   NEUROLOGICAL:  General: In no acute distress.   Awake, alert, oriented to person, place, and time.  Pupils equal round and reactive to light.  Facial tone is symmetric.  Tongue protrusion is midline.  There is no pronator drift.   Strength:            Side Iliopsoas Quads Hamstring PF DF EHL  R 5 5 5 5 5 5   L 5 5 5 5 5 5      ROS (Neurologic):  Negative except as noted above  IMAGING: 04/12/2023 Stable appearing thoracolumbar x-rays  ASSESSMENT/PLAN:  Caroline Zamora is doing well approximately 3 months after thoracolumbar fusion.  She can resume activities as tolerated.  she will follow up with Dr. Marcell Barlow in 6 months with x-rays prior or sooner should she have any questions or concerns.  Advised to contact the office if any questions or concerns arise.  I spent a total of 25 minutes in both face-to-face and non-face-to-face activities for this visit on the date of this encounter.   Manning Charity PA-C Department of neurosurgery

## 2023-04-26 ENCOUNTER — Ambulatory Visit: Payer: Medicare HMO | Admitting: Internal Medicine

## 2023-06-22 ENCOUNTER — Other Ambulatory Visit: Payer: Self-pay | Admitting: Internal Medicine

## 2023-06-22 DIAGNOSIS — Z1231 Encounter for screening mammogram for malignant neoplasm of breast: Secondary | ICD-10-CM

## 2023-07-05 ENCOUNTER — Ambulatory Visit: Payer: Medicare HMO | Attending: Internal Medicine | Admitting: Internal Medicine

## 2023-07-05 ENCOUNTER — Encounter: Payer: Self-pay | Admitting: Internal Medicine

## 2023-07-05 VITALS — BP 140/94 | HR 70 | Ht 66.0 in | Wt 180.2 lb

## 2023-07-05 DIAGNOSIS — I493 Ventricular premature depolarization: Secondary | ICD-10-CM | POA: Diagnosis not present

## 2023-07-05 DIAGNOSIS — R55 Syncope and collapse: Secondary | ICD-10-CM

## 2023-07-05 DIAGNOSIS — I951 Orthostatic hypotension: Secondary | ICD-10-CM

## 2023-07-05 DIAGNOSIS — I480 Paroxysmal atrial fibrillation: Secondary | ICD-10-CM | POA: Diagnosis not present

## 2023-07-05 NOTE — Patient Instructions (Signed)
Medication Instructions:  Take Lasix for 2 days.  Decrease Salt intake  *If you need a refill on your cardiac medications before your next appointment, please call your pharmacy*   Follow-Up: At [facility], you and your health needs are our priority.  As part of our continuing mission to provide you with exceptional heart care, we have created designated Provider Care Teams.  These Care Teams include your primary Cardiologist (physician) and Advanced Practice Providers (APPs -  Physician Assistants and Nurse Practitioners) who all work together to provide you with the care you need, when you need it.  We recommend signing up for the patient portal called "MyChart".  Sign up information is provided on this After Visit Summary.  MyChart is used to connect with patients for Virtual Visits (Telemedicine).  Patients are able to view lab/test results, encounter notes, upcoming appointments, etc.  Non-urgent messages can be sent to your provider as well.   To learn more about what you can do with MyChart, go to [email or web address].    Your next appointment:   12 month(s)  Provider:   Sherryl Manges, MD

## 2023-07-05 NOTE — Progress Notes (Signed)
Patient Care Team: Lauro Regulus, MD as PCP - General (Internal Medicine)   HPI  Caroline Zamora is a 82 y.o. female seen in follow-up for orthostatic syncope and complex ventricular ectopy and modest nonischemic cardiomyopathy; interval atrial fibrillation started on amio and Apixaban     Hospitalized for syncope following my initial evaluation, found to have orthostatic hypotension, underwent catheterization and cMRI and was discharged with recommendation for monitoring  Interval back surgery.  Orthostatic hypotension has been much improved with salt and water repletion and adjustment of her antihypertensives.  Of late, she has noted a little bit of swelling.  Blood pressures at home typically 130s today higher.  See below  No chest pain no shortness of breath orthopnea or lightheadedness.   Patient denies symptoms of respiratory, GI intolerance, sun sensitivity, neurological symptoms attributable to amiodarone.        DATE TEST EF    8/20 Echo   55-60 %    1/23 Echo   35 % LAE with that  1/23 MYOVIEW 59% Normal perfusion  5/23 cMRI 42%   1/24 Echo   55-60%     Date Cr K Hgb LDL TSH LFTs  9/21     14.5      3/23 0.9 4.2   130    7/23   13.4<< 9.7 (?)   12 12.  9/23     4.8   7/24 1.1 4.6 14.1  3.2 14    Records and Results Reviewed   Past Medical History:  Diagnosis Date   Arthritis    lumbar spine   CKD (chronic kidney disease), stage II    Colon cancer (HCC)    Depression    Dysphagia    Headache    migraine   Hx of adenomatous colonic polyps    Hyperlipidemia    Hypertension    PAF (paroxysmal atrial fibrillation) (HCC)    Paroxysmal atrial fibrillation (HCC)    Skin cancer    Syncope    Type 2 diabetes mellitus (HCC) 12/11/2022    Past Surgical History:  Procedure Laterality Date   ABDOMINAL HYSTERECTOMY     with removal of tubes and ovaries   ANKLE FRACTURE SURGERY Right    COLONOSCOPY     COLONOSCOPY WITH PROPOFOL N/A 09/03/2017    Procedure: COLONOSCOPY WITH PROPOFOL;  Surgeon: Scot Jun, MD;  Location: [facility] ENDOSCOPY;  Service: Endoscopy;  Laterality: N/A;   COLONOSCOPY WITH PROPOFOL N/A 09/13/2020   Procedure: COLONOSCOPY WITH PROPOFOL;  Surgeon: Regis Bill, MD;  Location: [facility] ENDOSCOPY;  Service: Endoscopy;  Laterality: N/A;   DILATION AND CURETTAGE OF UTERUS     ESOPHAGOGASTRODUODENOSCOPY     ESOPHAGOGASTRODUODENOSCOPY (EGD) WITH PROPOFOL N/A 09/13/2020   Procedure: ESOPHAGOGASTRODUODENOSCOPY (EGD) WITH PROPOFOL;  Surgeon: Regis Bill, MD;  Location: [facility] ENDOSCOPY;  Service: Endoscopy;  Laterality: N/A;   LEFT HEART CATH AND CORONARY ANGIOGRAPHY N/A 04/17/2022   Procedure: LEFT HEART CATH AND CORONARY ANGIOGRAPHY;  Surgeon: Runell Gess, MD;  Location: [facility] INVASIVE CV LAB;  Service: Cardiovascular;  Laterality: N/A;   skin cancer removal     TONSILLECTOMY     TUBAL LIGATION      Current Meds  Medication Sig   acetaminophen (TYLENOL) 500 MG tablet Take 500 mg by mouth every 6 (six) hours as needed for mild pain.   amiodarone (PACERONE) 200 MG tablet Take 200 mg by mouth in the morning.   apixaban (ELIQUIS) 5  MG TABS tablet Take 1 tablet (5 mg total) by mouth 2 (two) times daily.   estradiol (ESTRACE) 0.5 MG tablet Take 0.5 mg by mouth at bedtime.   levothyroxine (SYNTHROID) 50 MCG tablet Take 50 mcg by mouth daily before breakfast.   venlafaxine XR (EFFEXOR-XR) 150 MG 24 hr capsule Take 150 mg by mouth daily.    Allergies  Allergen Reactions   Hydromorphone Other (See Comments)    Significant confusion and agitation   Desyrel [Trazodone] Rash   Sulfa Antibiotics Rash      Review of Systems negative except from HPI and PMH  Physical Exam BP (!) 140/94 (BP Location: Left Arm, Patient Position: Sitting, Cuff Size: Normal)   Pulse 70   Ht 5\' 6"  (1.676 m)   Wt 180 lb 3.2 oz (81.7 kg)   LMP  (LMP Unknown)   SpO2 97%   BMI 29.09 kg/m  Well developed and nourished in no  acute distress HENT normal Neck supple with JVP-  flat  Clear Regular rate and rhythm, no murmurs or gallops Abd-soft with active BS No Clubbing cyanosis right greater than left edema Skin-warm and dry A & Oriented  Grossly normal sensory and motor function  ECG atrial rhythm at 70, probably not junctional but a negative P wave axis in lead 58F Right bundle branch block 15/14/48 New from 1/24  CrCl cannot be calculated (Patient's most recent lab result is older than the maximum 21 days allowed.).   Assessment and  Plan Syncope abrupt in onset/offset  Orthostatic hypotension   Atrial fibrillation/flutter   PVCs-left bundle inferior axis   Dyspnea on exertion   Cardiomyopathy?   Incomplete right bundle branch block  No intercurrent atrial arrhythmias of which she is aware.  Continue the amiodarone at 200 mg.  Surveillance laboratories were normal No bleeding.  Continue Eliquis dosed appropriately for age weight and renal function  Orthostasis is largely mitigated by having a higher systolic blood pressure.  Will continue to let it drift up; however, there is interval weight gain, asymmetric edema and I suspect salt and volume overload.  She has furosemide 20 mg at home from previous prescription.  She will take it for 2 days.  Dyspnea on exertion is mostly pretty quiescient has been a little bit more problematic of late concurrent with weight gain

## 2023-07-24 ENCOUNTER — Ambulatory Visit
Admission: RE | Admit: 2023-07-24 | Discharge: 2023-07-24 | Disposition: A | Discharge status: Home / Self Care | Payer: Medicare HMO | Source: Ambulatory Visit | Attending: Internal Medicine | Admitting: Internal Medicine

## 2023-07-24 DIAGNOSIS — Z1231 Encounter for screening mammogram for malignant neoplasm of breast: Secondary | ICD-10-CM | POA: Insufficient documentation

## 2023-08-07 ENCOUNTER — Encounter: Payer: Self-pay | Admitting: Family Medicine

## 2023-08-09 ENCOUNTER — Other Ambulatory Visit: Payer: Self-pay | Admitting: Internal Medicine

## 2023-08-09 DIAGNOSIS — R928 Other abnormal and inconclusive findings on diagnostic imaging of breast: Secondary | ICD-10-CM

## 2023-08-14 ENCOUNTER — Ambulatory Visit
Admission: RE | Admit: 2023-08-14 | Discharge: 2023-08-14 | Disposition: A | Discharge status: Home / Self Care | Payer: Medicare HMO | Source: Ambulatory Visit | Attending: Internal Medicine | Admitting: Internal Medicine

## 2023-08-14 DIAGNOSIS — R928 Other abnormal and inconclusive findings on diagnostic imaging of breast: Secondary | ICD-10-CM | POA: Insufficient documentation

## 2023-08-16 ENCOUNTER — Other Ambulatory Visit: Payer: Self-pay | Admitting: Internal Medicine

## 2023-08-16 DIAGNOSIS — N63 Unspecified lump in unspecified breast: Secondary | ICD-10-CM

## 2023-08-16 DIAGNOSIS — R928 Other abnormal and inconclusive findings on diagnostic imaging of breast: Secondary | ICD-10-CM

## 2023-08-23 ENCOUNTER — Ambulatory Visit
Admission: RE | Admit: 2023-08-23 | Discharge: 2023-08-23 | Disposition: A | Discharge status: Home / Self Care | Payer: Medicare HMO | Source: Ambulatory Visit | Attending: Internal Medicine | Admitting: Internal Medicine

## 2023-08-23 DIAGNOSIS — N63 Unspecified lump in unspecified breast: Secondary | ICD-10-CM | POA: Diagnosis present

## 2023-08-23 DIAGNOSIS — R928 Other abnormal and inconclusive findings on diagnostic imaging of breast: Secondary | ICD-10-CM | POA: Diagnosis present

## 2023-08-23 DIAGNOSIS — N62 Hypertrophy of breast: Secondary | ICD-10-CM | POA: Diagnosis present

## 2023-08-23 HISTORY — PX: BREAST BIOPSY: SHX20

## 2023-08-23 MED ORDER — LIDOCAINE-EPINEPHRINE 1 %-1:100000 IJ SOLN
8.0000 mL | Freq: Once | INTRAMUSCULAR | Status: AC
  Administered 2023-08-23: 8 mL
  Filled 2023-08-23: qty 8

## 2023-08-23 MED ORDER — LIDOCAINE 1 % OPTIME INJ - NO CHARGE
2.0000 mL | Freq: Once | INTRAMUSCULAR | Status: AC
  Administered 2023-08-23: 2 mL via INTRADERMAL
  Filled 2023-08-23: qty 2

## 2023-08-24 LAB — SURGICAL PATHOLOGY

## 2023-10-16 ENCOUNTER — Ambulatory Visit
Admission: RE | Admit: 2023-10-16 | Discharge: 2023-10-16 | Disposition: A | Discharge status: Home / Self Care | Payer: Medicare HMO | Attending: Neurosurgery | Admitting: Neurosurgery

## 2023-10-16 ENCOUNTER — Ambulatory Visit: Payer: Medicare HMO | Admitting: Neurosurgery

## 2023-10-16 ENCOUNTER — Other Ambulatory Visit: Payer: Self-pay

## 2023-10-16 ENCOUNTER — Ambulatory Visit
Admission: RE | Admit: 2023-10-16 | Discharge: 2023-10-16 | Disposition: A | Discharge status: Home / Self Care | Payer: Medicare HMO | Source: Ambulatory Visit | Attending: Neurosurgery | Admitting: Neurosurgery

## 2023-10-16 VITALS — BP 158/98 | Ht 66.0 in | Wt 180.0 lb

## 2023-10-16 DIAGNOSIS — Z981 Arthrodesis status: Secondary | ICD-10-CM | POA: Diagnosis not present

## 2023-10-16 DIAGNOSIS — Z8781 Personal history of (healed) traumatic fracture: Secondary | ICD-10-CM

## 2023-10-16 DIAGNOSIS — S22082D Unstable burst fracture of T11-T12 vertebra, subsequent encounter for fracture with routine healing: Secondary | ICD-10-CM

## 2023-10-16 NOTE — Progress Notes (Signed)
   REFERRING PHYSICIAN:  Lauro Regulus, Md [address] [facility] [address] [place],  [place] [address]  DOS: 01/19/23 T10-L2 fusion. Orif T12 fracture. T12 laminectomy   HISTORY OF PRESENT ILLNESS: Caroline Zamora is status post thoracolumbar fusion for fracture. she is doing well postoperatively.  She has had significant improvement over time.  She is now working out at [employer]    PHYSICAL EXAMINATION:  General: Patient is well developed, well nourished, calm, collected, and in no apparent distress.   NEUROLOGICAL:  General: In no acute distress.   Awake, alert, oriented to person, place, and time.  Pupils equal round and reactive to light.  Facial tone is symmetric.  Tongue protrusion is midline.  There is no pronator drift.  Strength:            Side Iliopsoas Quads Hamstring PF DF EHL  R 5 5 5 5 5 5   L 5 5 5 5 5 5    Incision c/d/I and healing well   ROS (Neurologic):  Negative except as noted above  IMAGING: No complications noted  ASSESSMENT/PLAN:  Caroline Zamora is doing well after spinal fusion.  I am pleased with her progress.  I will see her back on an as-needed basis.  She can continue activity as tolerated.    I spent a total of 10 minutes in this patient's care today. This time was spent reviewing pertinent records including imaging studies, obtaining and confirming history, performing a directed evaluation, formulating and discussing my recommendations, and documenting the visit within the medical record.   Venetia Night Department of neurosurgery

## 2024-05-18 IMAGING — CT CT MAXILLOFACIAL W/O CM
3 of 6 series · 15 of 47 positions shown, 18 images · non-contrast
Comparison: April 13, 2022.

CLINICAL DATA: Trauma.



[Series 2: maxilllofacial 2.0 hr40 3 · axial · 0.32mm/px · z∈[-242,-108]mm · 10 of 79 slices shown, 13 images]
[im 6/79  brain]
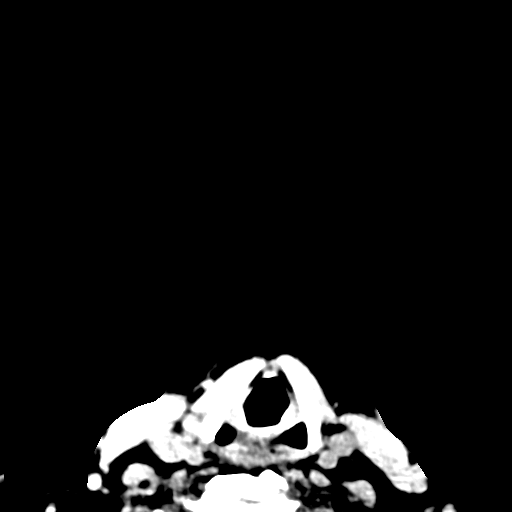
[im 6/79  bone]
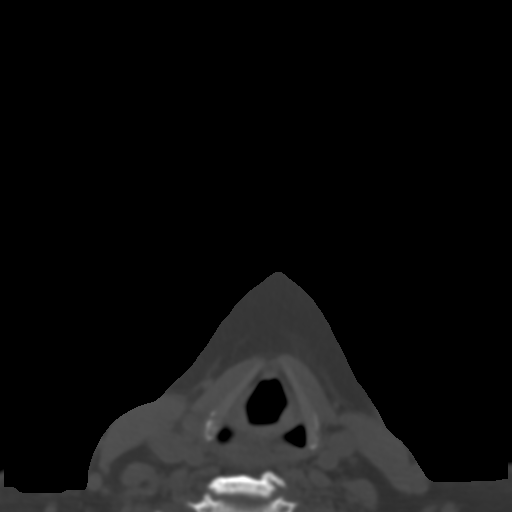
[im 12/79  bone]
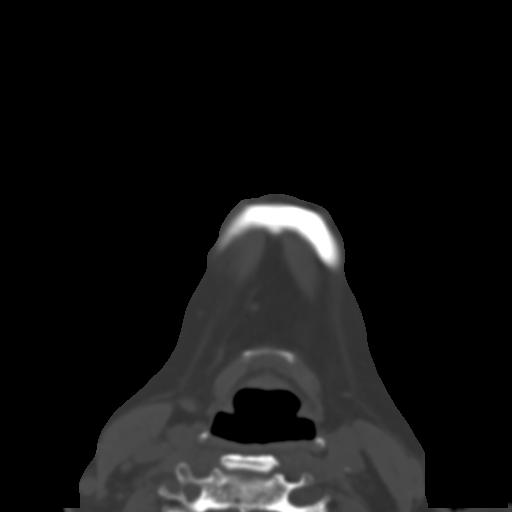
[im 23/79  bone]
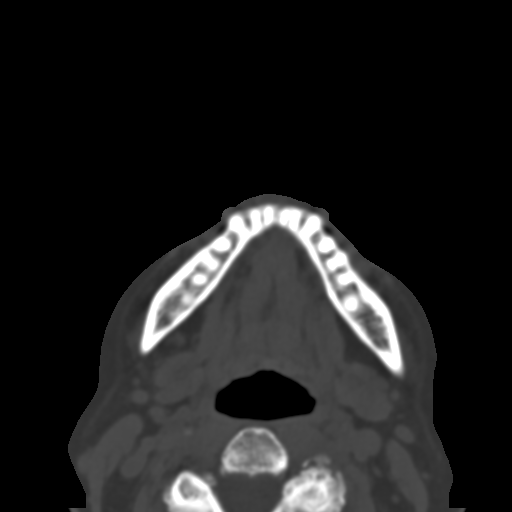
[im 28/79  bone]
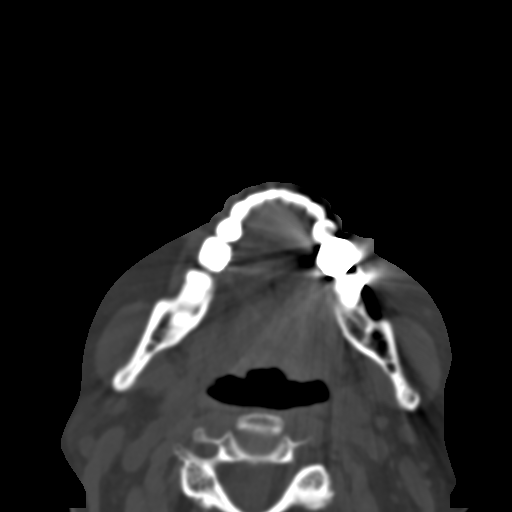
[im 34/79  brain]
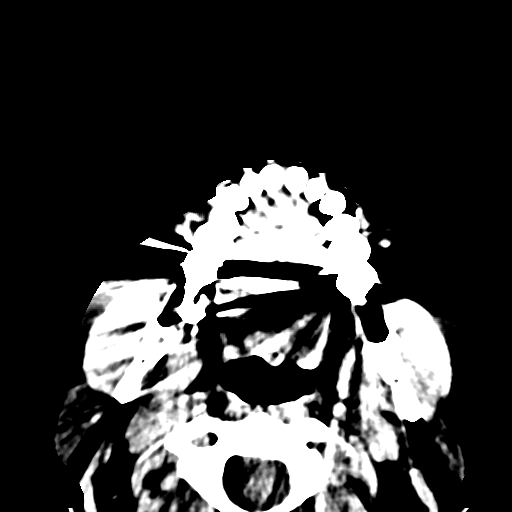
[im 34/79  bone]
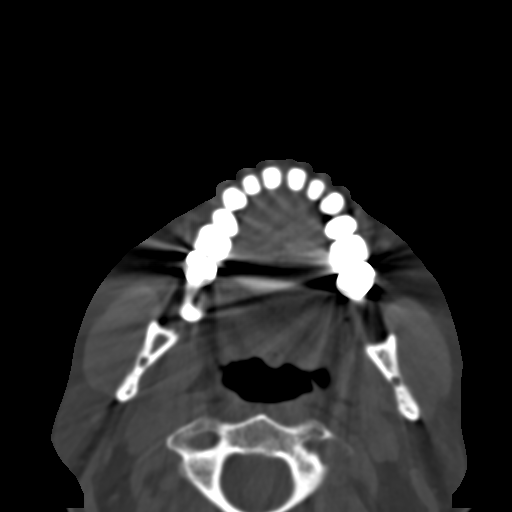
[im 45/79  bone]
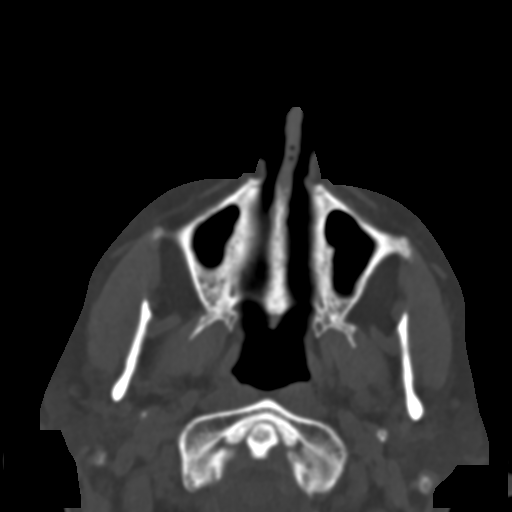
[im 51/79  bone]
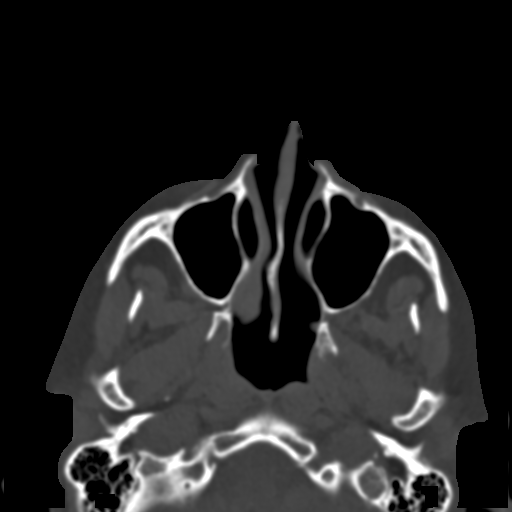
[im 56/79  bone]
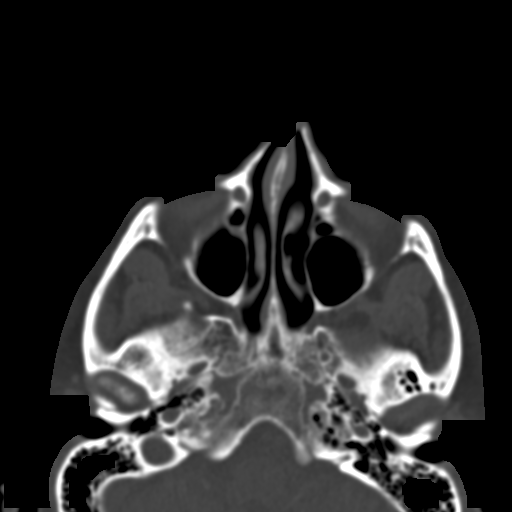
[im 67/79  brain]
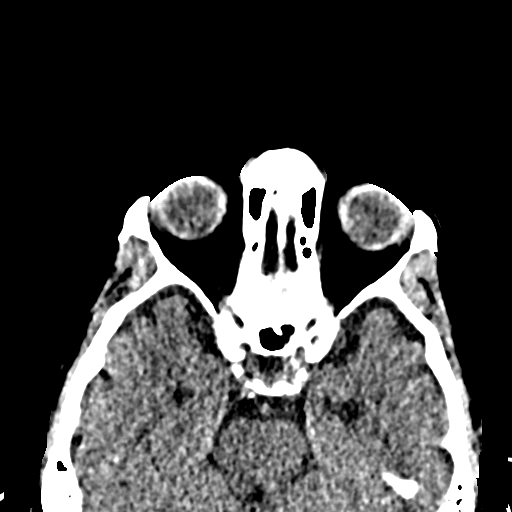
[im 67/79  bone]
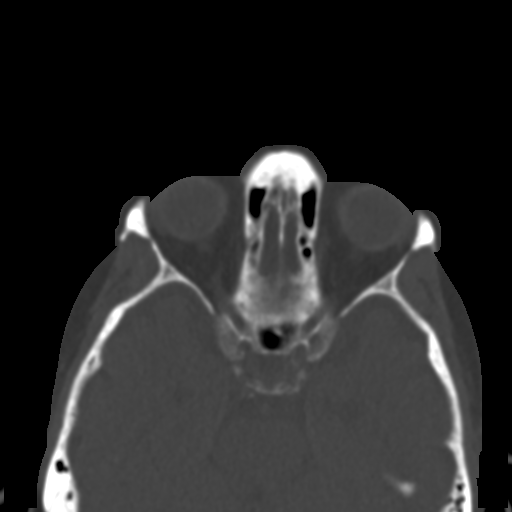
[im 73/79  bone]
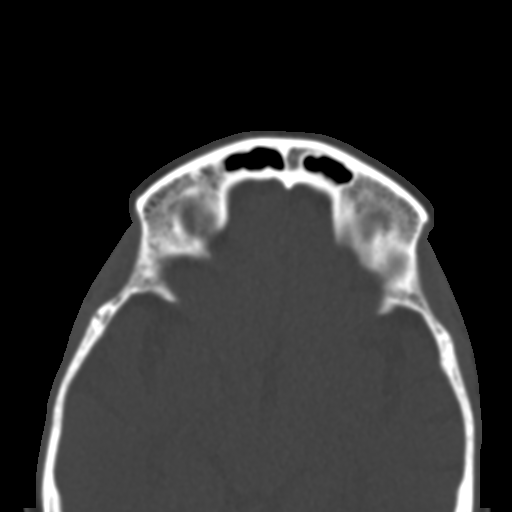

[Series 6: st cor · coronal · 0.31mm/px · 3 of 64 slices shown]
[im 16/64  bone]
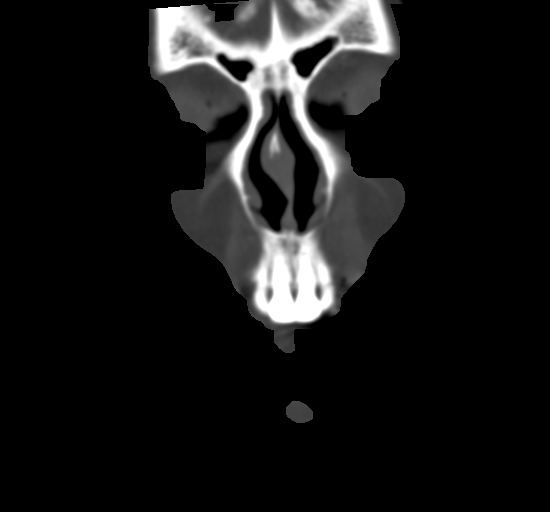
[im 32/64  bone]
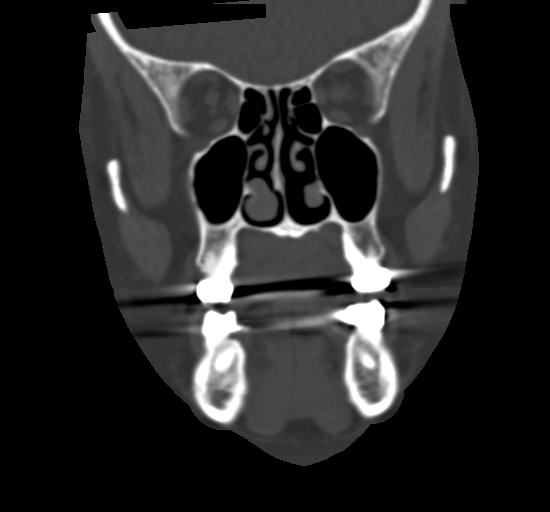
[im 48/64  bone]
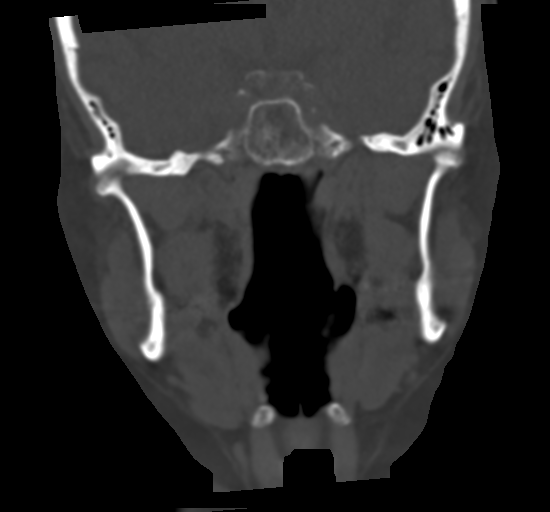

[Series 9: bone sag · sagittal · 0.25mm/px · 2 of 87 slices shown]
[im 29/87  bone]
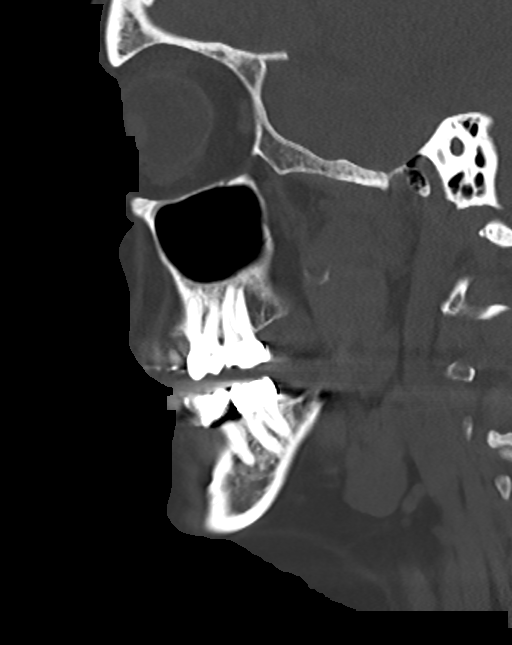
[im 58/87  bone]
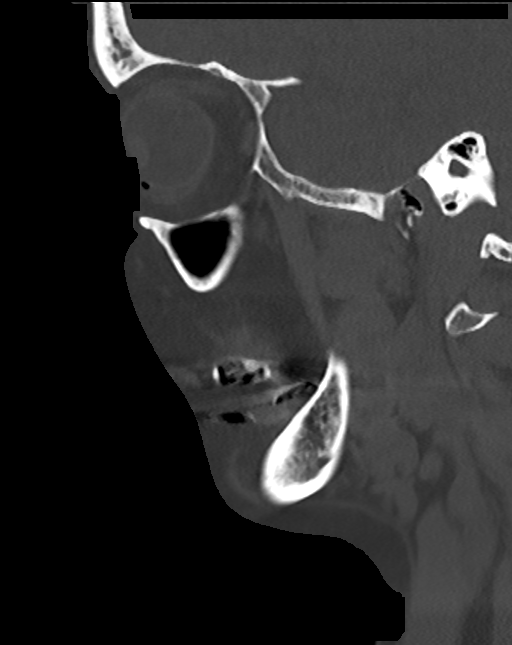

[15 of 47 positions shown; findings below may reference images not displayed]

FINDINGS: CT HEAD FINDINGS

Brain: No evidence of acute infarction, hemorrhage, hydrocephalus,
extra-axial collection or mass lesion/mass effect.

Vascular: No hyperdense vessel identified.

Skull: No acute fracture.

Other: No mastoid effusions.

CT MAXILLOFACIAL FINDINGS

Osseous: No fracture or mandibular dislocation. No destructive
process.

Orbits: Negative. No traumatic or inflammatory finding.

Sinuses: Clear.

Soft tissues: Negative.

CT CERVICAL SPINE FINDINGS

Alignment: Slight anterolisthesis of C3 on C4, favor degenerative
given facet arthropathy this level.

Skull base and vertebrae: No evidence of acute fracture. Vertebral
body heights are maintained.

Soft tissues and spinal canal: No prevertebral fluid or swelling. No
visible canal hematoma.

Disc levels: Moderate multilevel degenerative change including
degenerative disc disease and facet/uncovertebral hypertrophy.

Upper chest: Biapical pleuroparenchymal scarring. Otherwise,
visualized lung apices are clear.
IMPRESSION: 1. No evidence of acute intracranial abnormality or facial fracture.
2. No evidence of acute fracture or traumatic malalignment the
cervical spine.

## 2024-05-18 IMAGING — CT CT HEAD W/O CM
4 series · 15 of 47 positions shown, 17 images · non-contrast
Comparison: April 13, 2022.

CLINICAL DATA: Trauma.



[Series 2: head bone · axial · 0.39mm/px · z∈[-150,-134]mm · 2 of 76 slices shown]
[im 8/76  bone]
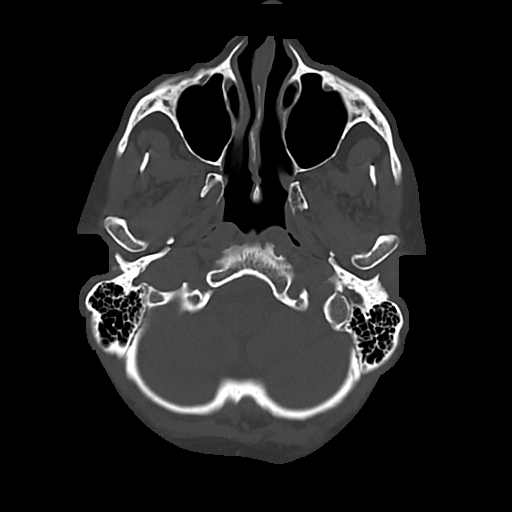
[im 16/76  bone]
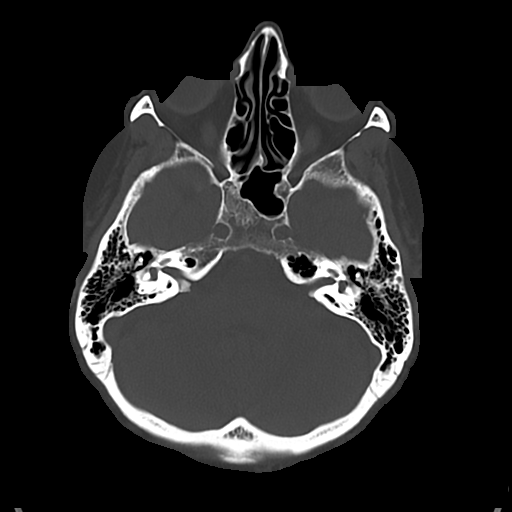

[Series 3: head wo · axial · 0.39mm/px · z∈[-149,-34]mm · 7 of 31 slices shown, 9 images]
[im 4/31  brain]
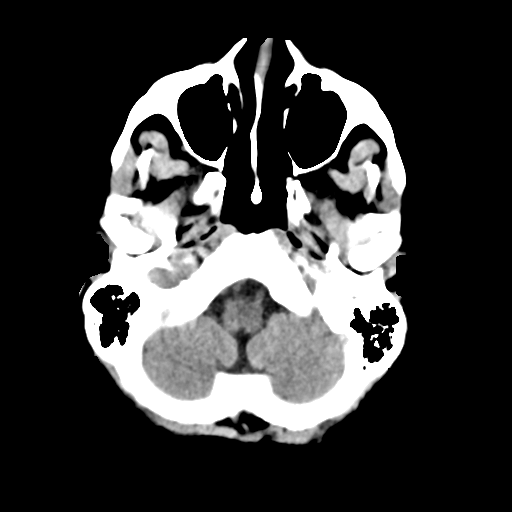
[im 4/31  bone]
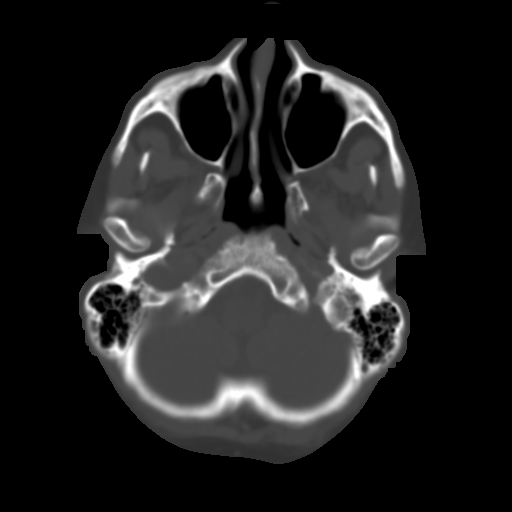
[im 8/31  brain]
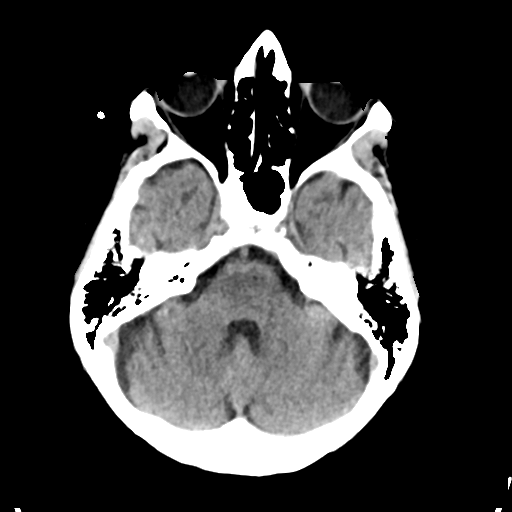
[im 12/31  brain]
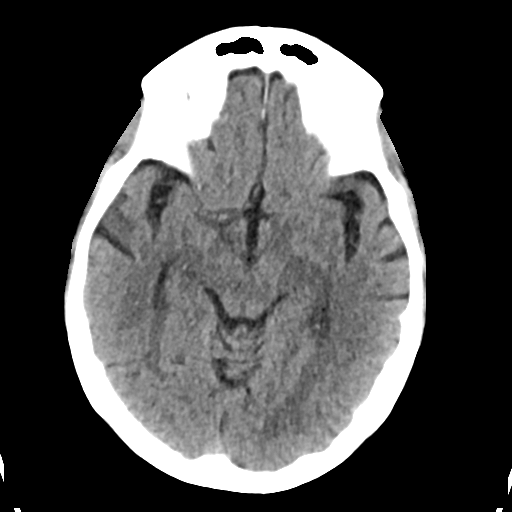
[im 16/31  brain]
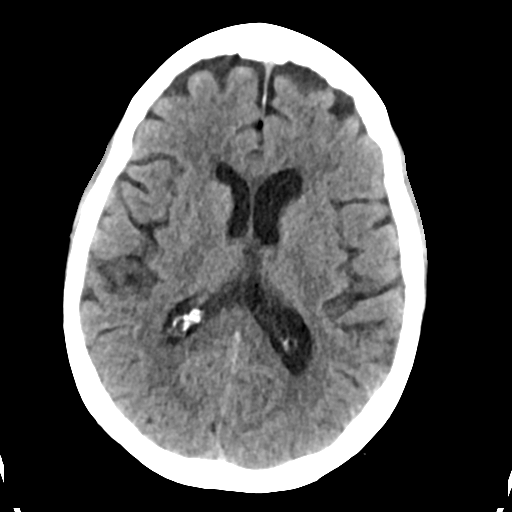
[im 19/31  brain]
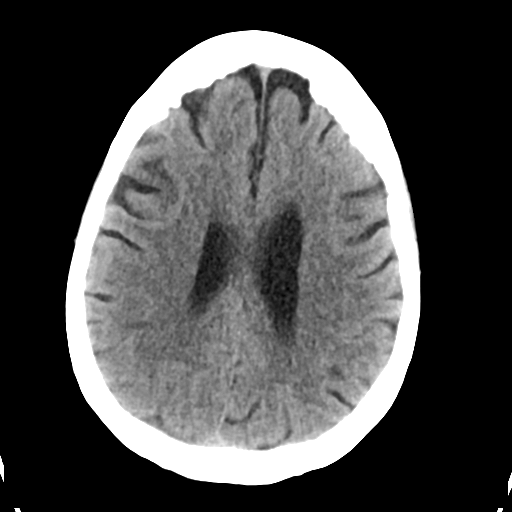
[im 19/31  bone]
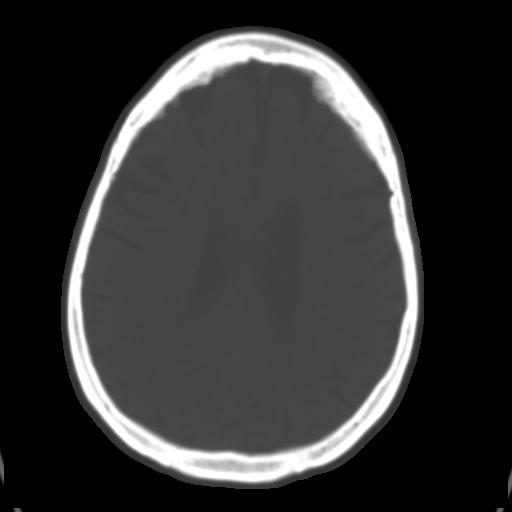
[im 23/31  brain]
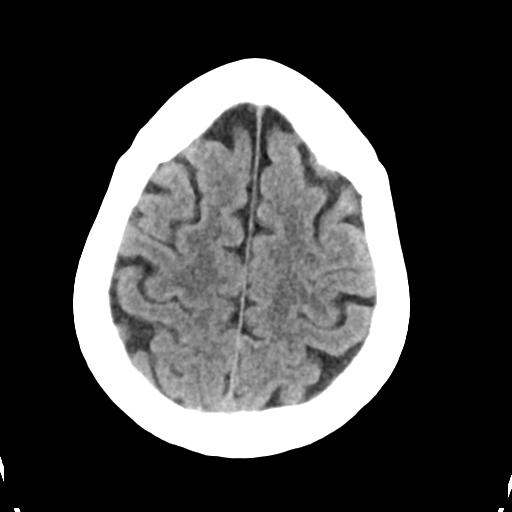
[im 27/31  brain]
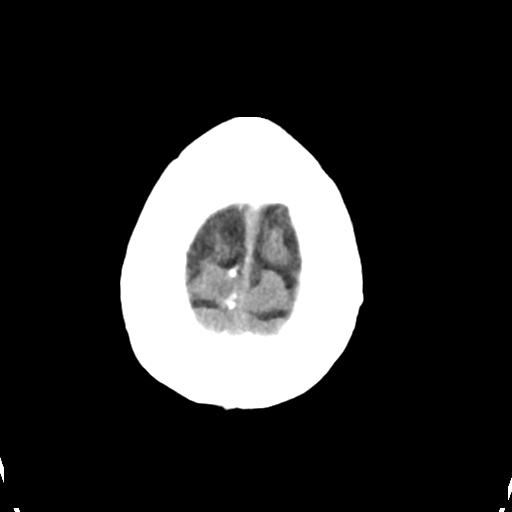

[Series 4: cor soft · coronal · 0.29mm/px · 3 of 67 slices shown]
[im 23/67  brain]
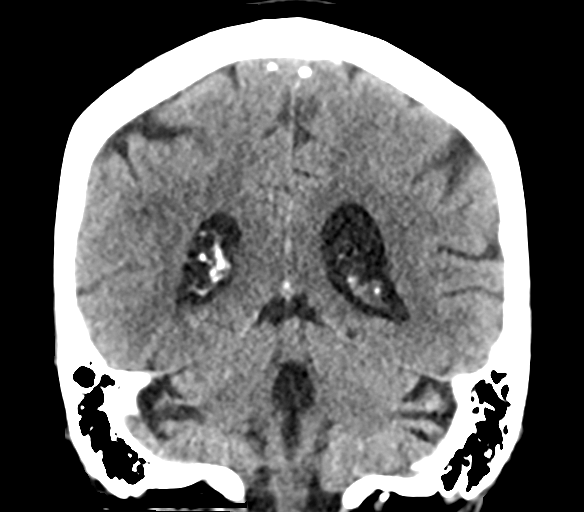
[im 30/67  brain]
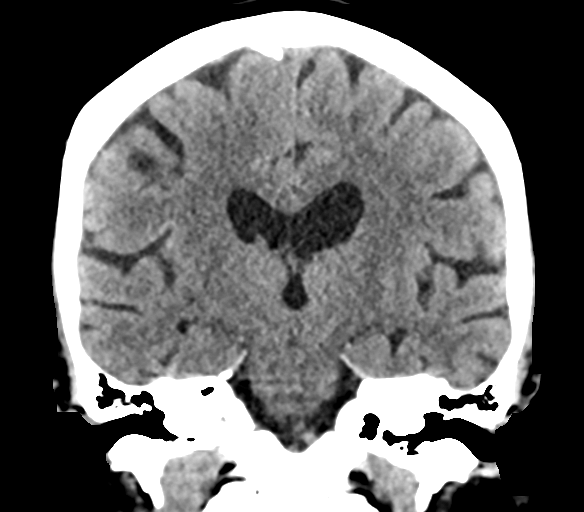
[im 37/67  brain]
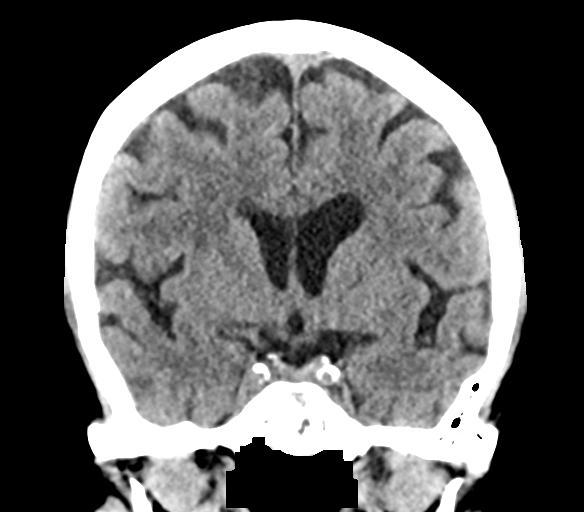

[Series 5: sag soft · sagittal · 0.29mm/px · 3 of 57 slices shown]
[im 19/57  brain]
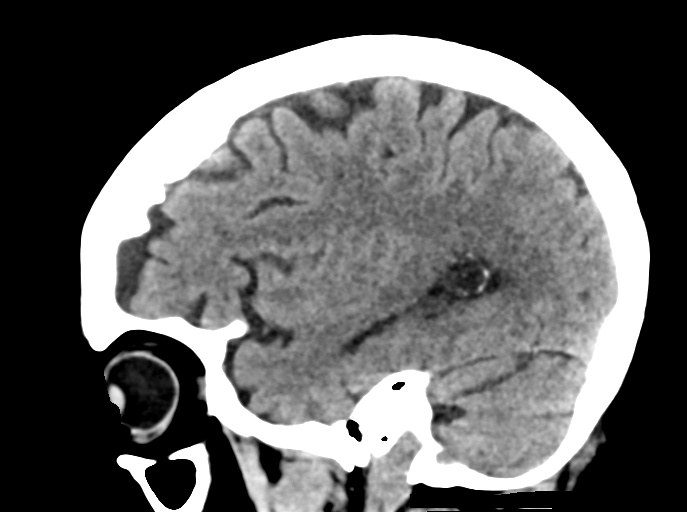
[im 29/57  brain]
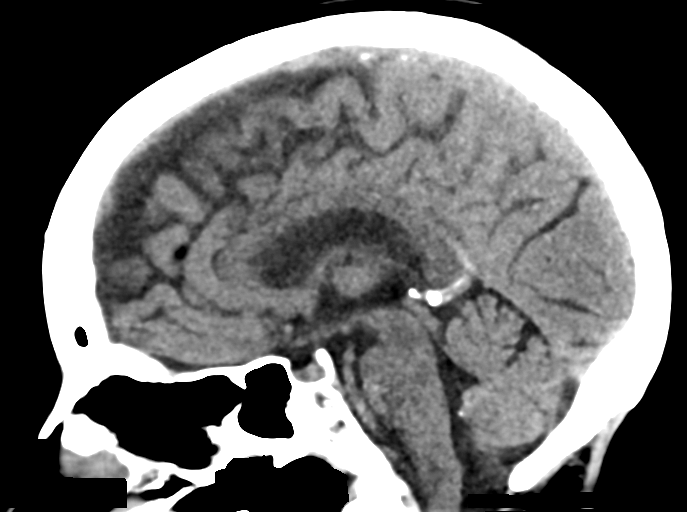
[im 38/57  brain]
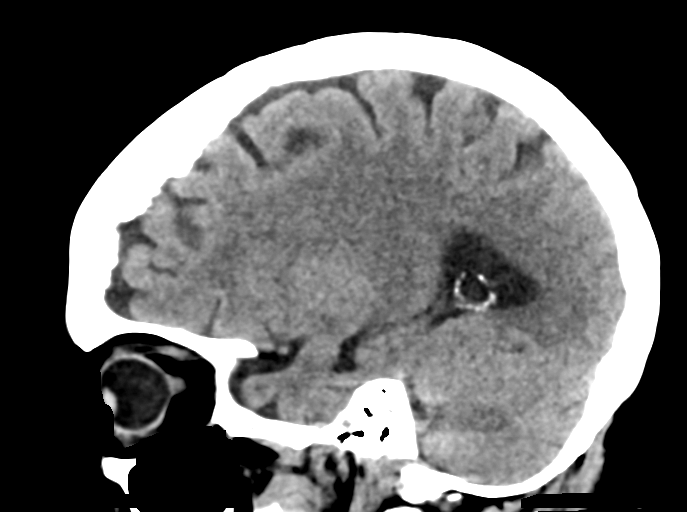

[15 of 47 positions shown; findings below may reference images not displayed]

FINDINGS: CT HEAD FINDINGS

Brain: No evidence of acute infarction, hemorrhage, hydrocephalus,
extra-axial collection or mass lesion/mass effect.

Vascular: No hyperdense vessel identified.

Skull: No acute fracture.

Other: No mastoid effusions.

CT MAXILLOFACIAL FINDINGS

Osseous: No fracture or mandibular dislocation. No destructive
process.

Orbits: Negative. No traumatic or inflammatory finding.

Sinuses: Clear.

Soft tissues: Negative.

CT CERVICAL SPINE FINDINGS

Alignment: Slight anterolisthesis of C3 on C4, favor degenerative
given facet arthropathy this level.

Skull base and vertebrae: No evidence of acute fracture. Vertebral
body heights are maintained.

Soft tissues and spinal canal: No prevertebral fluid or swelling. No
visible canal hematoma.

Disc levels: Moderate multilevel degenerative change including
degenerative disc disease and facet/uncovertebral hypertrophy.

Upper chest: Biapical pleuroparenchymal scarring. Otherwise,
visualized lung apices are clear.
IMPRESSION: 1. No evidence of acute intracranial abnormality or facial fracture.
2. No evidence of acute fracture or traumatic malalignment the
cervical spine.

## 2024-05-18 IMAGING — CT CT CERVICAL SPINE W/O CM
3 series · 12 of 33 positions shown, 14 images · non-contrast
Comparison: April 13, 2022.

CLINICAL DATA: Trauma.



[Series 3: c spine soft · axial · 0.30mm/px · z∈[-279,-163]mm · 4 of 86 slices shown, 5 images]
[im 14/86  soft-tissue]
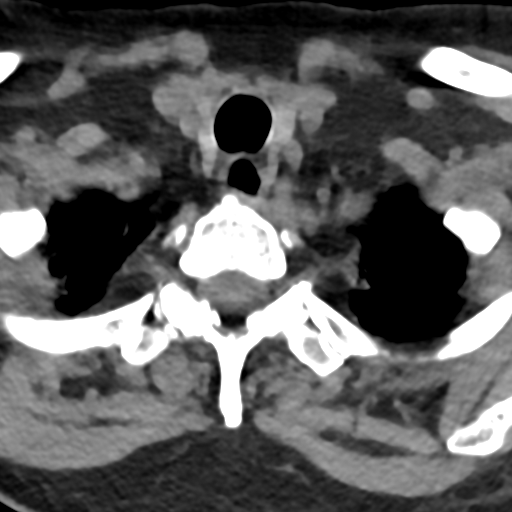
[im 14/86  bone]
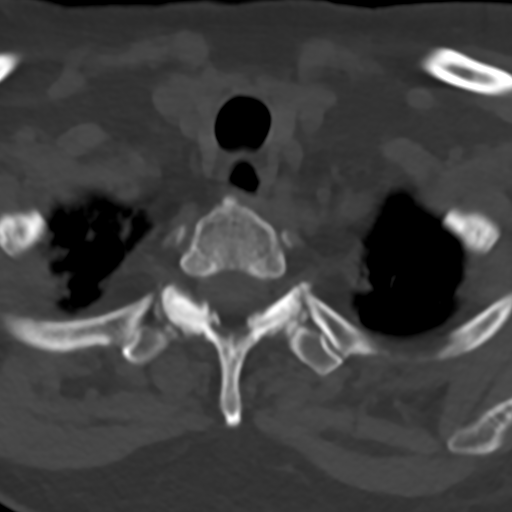
[im 33/86  bone]
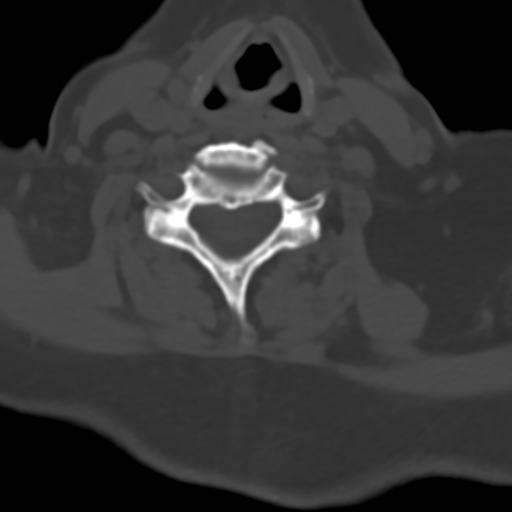
[im 53/86  bone]
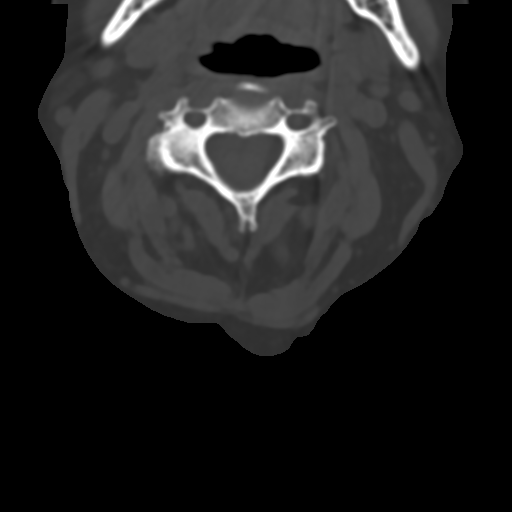
[im 72/86  bone]
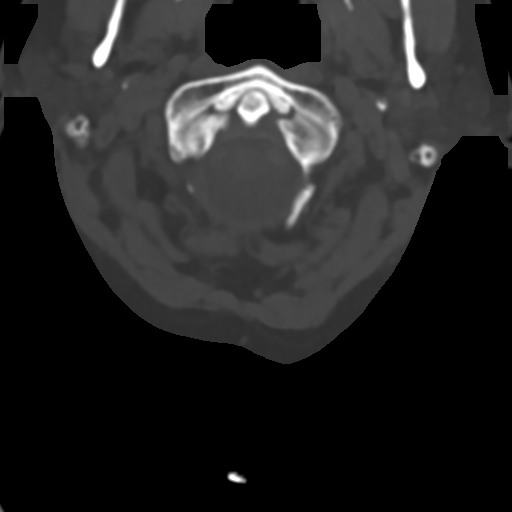

[Series 7: sag bone · sagittal · 0.21mm/px · 5 of 69 slices shown, 6 images]
[im 23/69  bone]
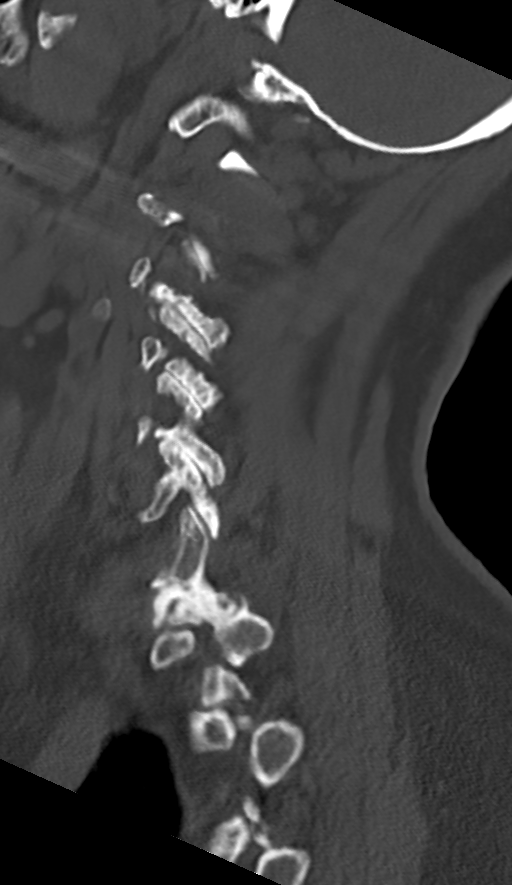
[im 29/69  bone]
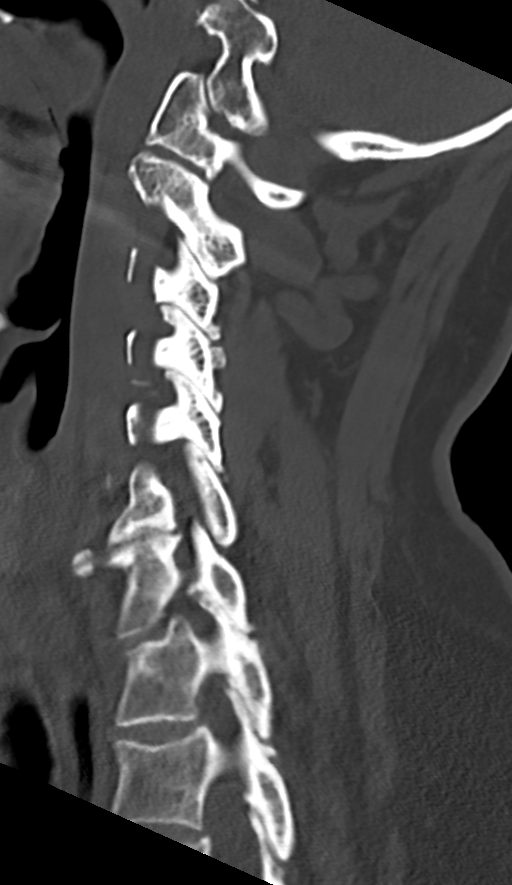
[im 35/69  soft-tissue]
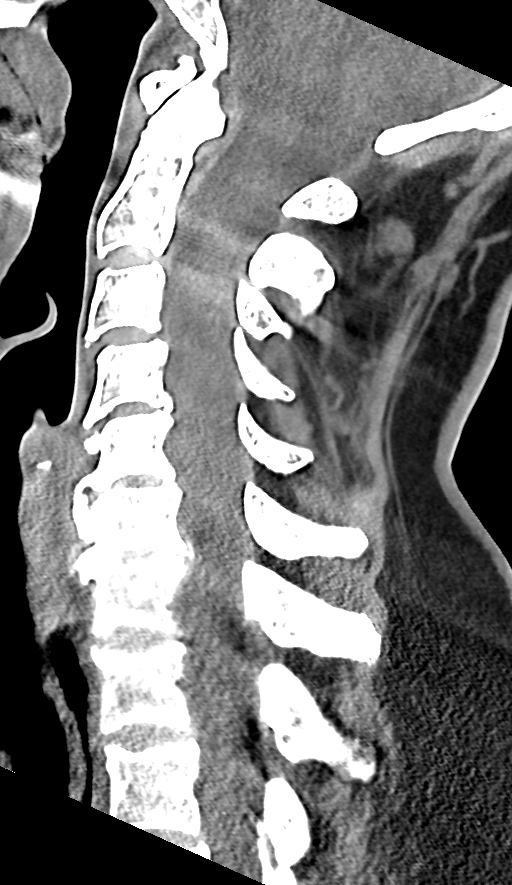
[im 35/69  bone]
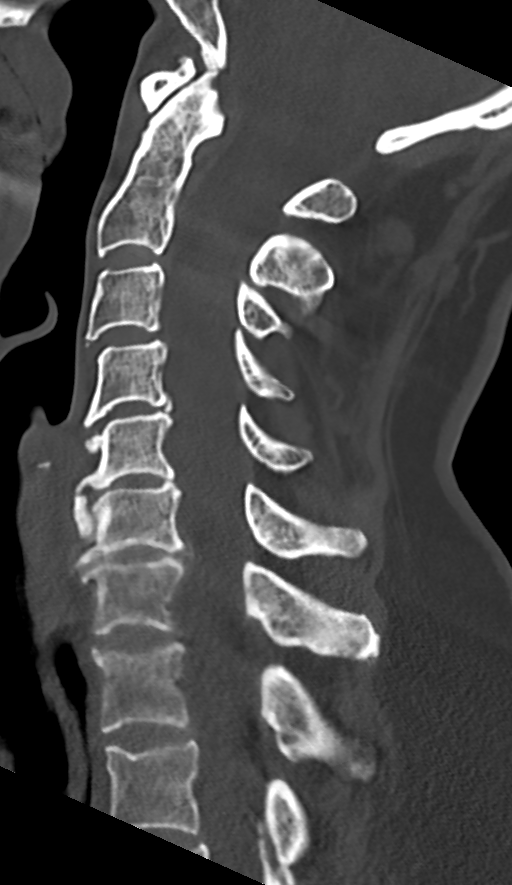
[im 40/69  bone]
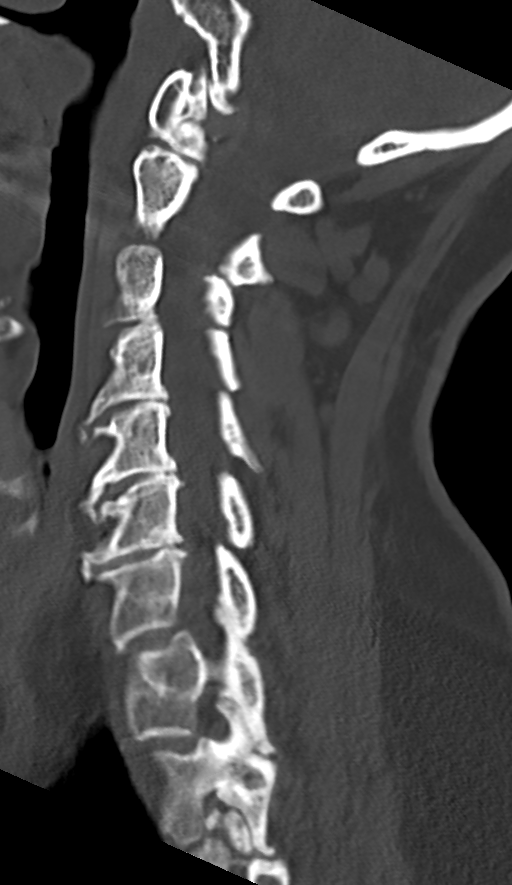
[im 46/69  bone]
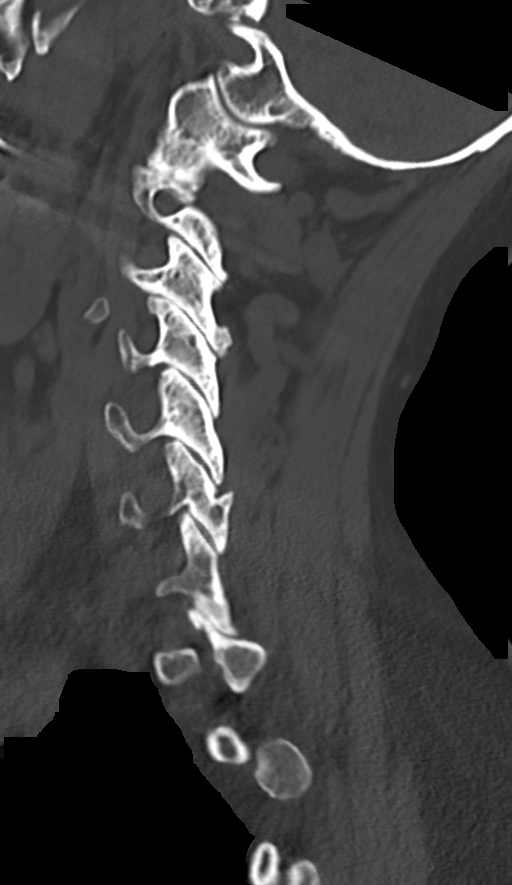

[Series 8: cor bone · coronal · 0.27mm/px · 3 of 54 slices shown]
[im 11/54  bone]
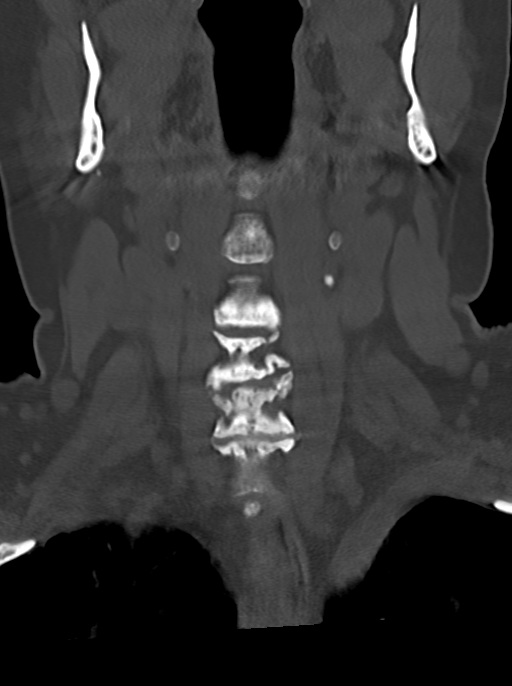
[im 22/54  bone]
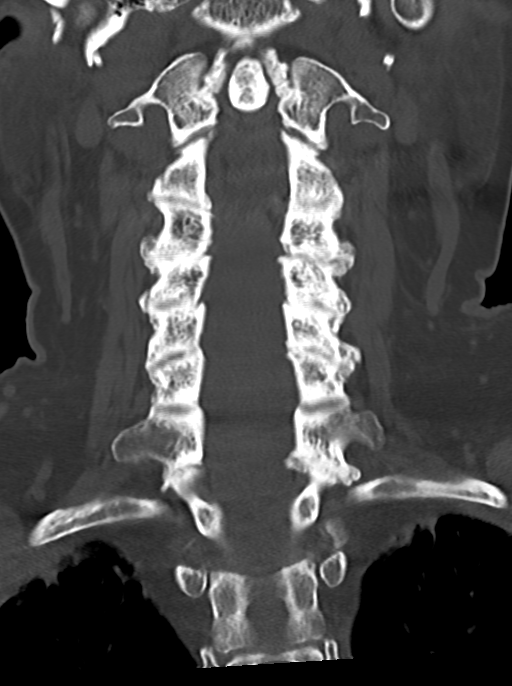
[im 32/54  bone]
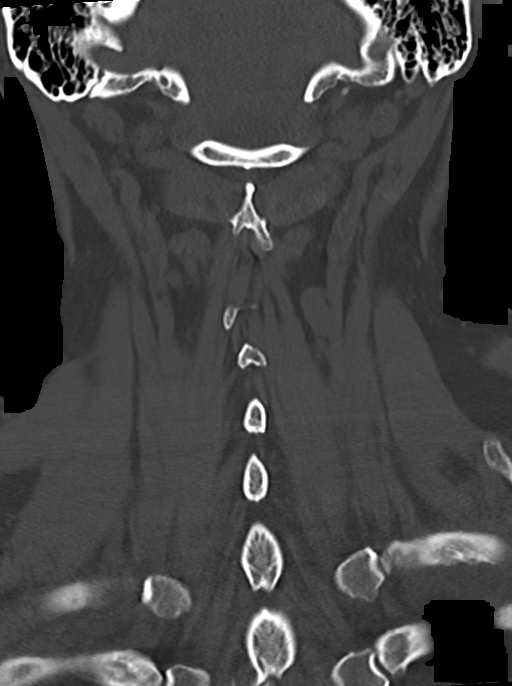

[12 of 33 positions shown; findings below may reference images not displayed]

FINDINGS: CT HEAD FINDINGS

Brain: No evidence of acute infarction, hemorrhage, hydrocephalus,
extra-axial collection or mass lesion/mass effect.

Vascular: No hyperdense vessel identified.

Skull: No acute fracture.

Other: No mastoid effusions.

CT MAXILLOFACIAL FINDINGS

Osseous: No fracture or mandibular dislocation. No destructive
process.

Orbits: Negative. No traumatic or inflammatory finding.

Sinuses: Clear.

Soft tissues: Negative.

CT CERVICAL SPINE FINDINGS

Alignment: Slight anterolisthesis of C3 on C4, favor degenerative
given facet arthropathy this level.

Skull base and vertebrae: No evidence of acute fracture. Vertebral
body heights are maintained.

Soft tissues and spinal canal: No prevertebral fluid or swelling. No
visible canal hematoma.

Disc levels: Moderate multilevel degenerative change including
degenerative disc disease and facet/uncovertebral hypertrophy.

Upper chest: Biapical pleuroparenchymal scarring. Otherwise,
visualized lung apices are clear.
IMPRESSION: 1. No evidence of acute intracranial abnormality or facial fracture.
2. No evidence of acute fracture or traumatic malalignment the
cervical spine.

## 2024-06-02 ENCOUNTER — Encounter: Payer: Self-pay | Admitting: Ophthalmology

## 2024-06-02 NOTE — Anesthesia Preprocedure Evaluation (Addendum)
 Anesthesia Evaluation  Patient identified by MRN, date of birth, ID band Patient awake    Reviewed: Allergy & Precautions, H&P , NPO status , Patient's Chart, lab work & pertinent test results  History of Anesthesia Complications (+) history of anesthetic complications  Airway Mallampati: III  TM Distance: >3 FB Neck ROM: Full    Dental no notable dental hx.    Pulmonary neg pulmonary ROS, pneumonia   Pulmonary exam normal breath sounds clear to auscultation       Cardiovascular hypertension, +CHF  negative cardio ROS Normal cardiovascular exam Rhythm:Regular Rate:Normal  12-12-22 echo   1. Left ventricular ejection fraction, by estimation, is 55 to 60%. The  left ventricle has normal function. The left ventricle has no regional  wall motion abnormalities. There is mild left ventricular hypertrophy.  Left ventricular diastolic parameters  are consistent with Grade I diastolic dysfunction (impaired relaxation).   2. Right ventricular systolic function is normal. The right ventricular  size is normal.   3. The mitral valve is normal in structure. Trivial mitral valve  regurgitation. No evidence of mitral stenosis.   4. The aortic valve is tricuspid. Aortic valve regurgitation is not  visualized. No aortic stenosis is present.   Not in atrial fibrillation at this time on exam on day of surgery   Neuro/Psych  Headaches PSYCHIATRIC DISORDERS Anxiety Depression    negative neurological ROS  negative psych ROS   GI/Hepatic negative GI ROS, Neg liver ROS,,,  Endo/Other  negative endocrine ROSdiabetesHypothyroidism    Renal/GU Renal diseasenegative Renal ROS  negative genitourinary   Musculoskeletal negative musculoskeletal ROS (+) Arthritis ,    Abdominal   Peds negative pediatric ROS (+)  Hematology negative hematology ROS (+)   Anesthesia Other Findings Medical History  Hx of adenomatous colonic  polyps Paroxysmal atrial fibrillation (HCC) Arthritis  Dysphagia Headache  Hyperlipidemia Hypertension  Colon cancer (HCC) Skin cancer  Depression PAF (paroxysmal atrial fibrillation)  (CKD (chronic kidney disease), stage II Syncope  Type 2 diabetes mellitus (HCC) Complication of anesthesia  Grade I diastolic dysfunction    Reproductive/Obstetrics negative OB ROS                              Anesthesia Physical Anesthesia Plan  ASA: 3  Anesthesia Plan: MAC   Post-op Pain Management:    Induction: Intravenous  PONV Risk Score and Plan:   Airway Management Planned: Natural Airway and Nasal Cannula  Additional Equipment:   Intra-op Plan:   Post-operative Plan:   Informed Consent: I have reviewed the patients History and Physical, chart, labs and discussed the procedure including the risks, benefits and alternatives for the proposed anesthesia with the patient or authorized representative who has indicated his/her understanding and acceptance.     Dental Advisory Given  Plan Discussed with: Anesthesiologist, CRNA and Surgeon  Anesthesia Plan Comments: (Patient consented for risks of anesthesia including but not limited to:  - adverse reactions to medications - damage to eyes, teeth, lips or other oral mucosa - nerve damage due to positioning  - sore throat or hoarseness - Damage to heart, brain, nerves, lungs, other parts of body or loss of life  Patient voiced understanding and assent.)         Anesthesia Quick Evaluation

## 2024-06-09 NOTE — Discharge Instructions (Signed)

## 2024-06-10 HISTORY — DX: Other ill-defined heart diseases: I51.89

## 2024-07-15 ENCOUNTER — Other Ambulatory Visit: Payer: Self-pay

## 2024-07-15 ENCOUNTER — Encounter: Payer: Self-pay | Admitting: Ophthalmology

## 2024-07-15 ENCOUNTER — Ambulatory Visit: Payer: Self-pay | Admitting: Anesthesiology

## 2024-07-15 ENCOUNTER — Ambulatory Visit
Admission: RE | Admit: 2024-07-15 | Discharge: 2024-07-15 | Disposition: A | Discharge status: Home / Self Care | Attending: Ophthalmology | Admitting: Ophthalmology

## 2024-07-15 ENCOUNTER — Encounter
Admission: RE | Disposition: A | Discharge status: Home / Self Care | Payer: Self-pay | Source: Home / Self Care | Attending: Ophthalmology

## 2024-07-15 DIAGNOSIS — F32A Depression, unspecified: Secondary | ICD-10-CM | POA: Diagnosis not present

## 2024-07-15 DIAGNOSIS — E1122 Type 2 diabetes mellitus with diabetic chronic kidney disease: Secondary | ICD-10-CM | POA: Insufficient documentation

## 2024-07-15 DIAGNOSIS — I129 Hypertensive chronic kidney disease with stage 1 through stage 4 chronic kidney disease, or unspecified chronic kidney disease: Secondary | ICD-10-CM | POA: Insufficient documentation

## 2024-07-15 DIAGNOSIS — E1136 Type 2 diabetes mellitus with diabetic cataract: Secondary | ICD-10-CM | POA: Diagnosis not present

## 2024-07-15 DIAGNOSIS — N182 Chronic kidney disease, stage 2 (mild): Secondary | ICD-10-CM | POA: Diagnosis not present

## 2024-07-15 DIAGNOSIS — E039 Hypothyroidism, unspecified: Secondary | ICD-10-CM | POA: Diagnosis not present

## 2024-07-15 DIAGNOSIS — H2511 Age-related nuclear cataract, right eye: Secondary | ICD-10-CM | POA: Diagnosis present

## 2024-07-15 DIAGNOSIS — F419 Anxiety disorder, unspecified: Secondary | ICD-10-CM | POA: Diagnosis not present

## 2024-07-15 HISTORY — DX: Other complications of anesthesia, initial encounter: T88.59XA

## 2024-07-15 HISTORY — PX: CATARACT EXTRACTION W/PHACO: SHX586

## 2024-07-15 SURGERY — PHACOEMULSIFICATION, CATARACT, WITH IOL INSERTION
Anesthesia: Monitor Anesthesia Care | Laterality: Right

## 2024-07-15 MED ORDER — SIGHTPATH DOSE#1 BSS IO SOLN
INTRAOCULAR | Status: DC | PRN
  Administered 2024-07-15 (×2): 15 mL via INTRAOCULAR

## 2024-07-15 MED ORDER — LIDOCAINE HCL (PF) 2 % IJ SOLN
INTRAOCULAR | Status: DC | PRN
  Administered 2024-07-15 (×2): 1 mL

## 2024-07-15 MED ORDER — ARMC OPHTHALMIC DILATING DROPS
1.0000 | OPHTHALMIC | Status: DC | PRN
  Administered 2024-07-15 (×6): 1 via OPHTHALMIC

## 2024-07-15 MED ORDER — DEXMEDETOMIDINE HCL IN NACL 200 MCG/50ML IV SOLN
INTRAVENOUS | Status: DC | PRN
  Administered 2024-07-15 (×2): 8 ug via INTRAVENOUS

## 2024-07-15 MED ORDER — ARMC OPHTHALMIC DILATING DROPS
OPHTHALMIC | Status: AC
  Filled 2024-07-15: qty 0.5

## 2024-07-15 MED ORDER — MIDAZOLAM HCL 2 MG/2ML IJ SOLN
INTRAMUSCULAR | Status: DC | PRN
  Administered 2024-07-15 (×2): 2 mg via INTRAVENOUS

## 2024-07-15 MED ORDER — SIGHTPATH DOSE#1 NA CHONDROIT SULF-NA HYALURON 40-17 MG/ML IO SOLN
INTRAOCULAR | Status: DC | PRN
  Administered 2024-07-15 (×2): 1 mL via INTRAOCULAR

## 2024-07-15 MED ORDER — LACTATED RINGERS IV SOLN: INTRAVENOUS | Status: DC

## 2024-07-15 MED ORDER — BRIMONIDINE TARTRATE-TIMOLOL 0.2-0.5 % OP SOLN
OPHTHALMIC | Status: DC | PRN
  Administered 2024-07-15 (×2): 1 [drp] via OPHTHALMIC

## 2024-07-15 MED ORDER — TETRACAINE HCL 0.5 % OP SOLN
OPHTHALMIC | Status: AC
  Filled 2024-07-15: qty 4

## 2024-07-15 MED ORDER — SIGHTPATH DOSE#1 BSS IO SOLN
INTRAOCULAR | Status: DC | PRN
  Administered 2024-07-15 (×2): 33 mL via OPHTHALMIC

## 2024-07-15 MED ORDER — MOXIFLOXACIN HCL 0.5 % OP SOLN
OPHTHALMIC | Status: DC | PRN
Start: 2024-07-15 — End: 2024-07-15
  Administered 2024-07-15 (×2): .2 mL via OPHTHALMIC

## 2024-07-15 MED ORDER — TETRACAINE HCL 0.5 % OP SOLN
1.0000 [drp] | OPHTHALMIC | Status: DC | PRN
  Administered 2024-07-15 (×6): 1 [drp] via OPHTHALMIC

## 2024-07-15 MED ORDER — MIDAZOLAM HCL 2 MG/2ML IJ SOLN
INTRAMUSCULAR | Status: AC
  Filled 2024-07-15: qty 2

## 2024-07-15 SURGICAL SUPPLY — 9 items
CYSTOTOME ANGL RVRS SHRT 25G (CUTTER) ×1 IMPLANT
FEE CATARACT SUITE SIGHTPATH (MISCELLANEOUS) ×1 IMPLANT
GLOVE BIOGEL PI IND STRL 8 (GLOVE) ×1 IMPLANT
GLOVE SURG LX STRL 8.0 MICRO (GLOVE) ×1 IMPLANT
GLOVE SURG SYN 6.5 PF PI BL (GLOVE) ×1 IMPLANT
LENS IOL TECNIS EYHANCE 20.0 (Intraocular Lens) IMPLANT
NDL FILTER BLUNT 18X1 1/2 (NEEDLE) ×1 IMPLANT
NEEDLE FILTER BLUNT 18X1 1/2 (NEEDLE) ×1 IMPLANT
SYR 3ML LL SCALE MARK (SYRINGE) ×1 IMPLANT

## 2024-07-15 NOTE — H&P (Signed)
 [facility]   Primary Care Physician:  Lenon Layman ORN, MD Ophthalmologist: Dr. Ollie  Pre-Procedure History & Physical: HPI:  Caroline Zamora is a 83 y.o. female here for cataract surgery.   Past Medical History:  Diagnosis Date   Arthritis    lumbar spine   CKD (chronic kidney disease), stage II    Colon cancer (HCC)    Complication of anesthesia    combative   Depression    Dysphagia    Grade I diastolic dysfunction    Headache    migraine   Hx of adenomatous colonic polyps    Hyperlipidemia    Hypertension    PAF (paroxysmal atrial fibrillation) (HCC)    Paroxysmal atrial fibrillation (HCC)    Skin cancer    Syncope    Type 2 diabetes mellitus (HCC) 12/11/2022    Past Surgical History:  Procedure Laterality Date   ABDOMINAL HYSTERECTOMY     with removal of tubes and ovaries   ANKLE FRACTURE SURGERY Right    BREAST BIOPSY Left 08/23/2023   u/s bx, RIBBON clip-path pending   BREAST BIOPSY Left 08/23/2023   US  LT BREAST BX W LOC DEV 1ST LESION IMG BX SPEC US  GUIDE 08/23/2023 [facility]   COLONOSCOPY     COLONOSCOPY WITH PROPOFOL  N/A 09/03/2017   Procedure: COLONOSCOPY WITH PROPOFOL ;  Surgeon: Viktoria Lamar DASEN, MD;  Location: [facility] ENDOSCOPY;  Service: Endoscopy;  Laterality: N/A;   COLONOSCOPY WITH PROPOFOL  N/A 09/13/2020   Procedure: COLONOSCOPY WITH PROPOFOL ;  Surgeon: Maryruth Ole DASEN, MD;  Location: [facility] ENDOSCOPY;  Service: Endoscopy;  Laterality: N/A;   DILATION AND CURETTAGE OF UTERUS     ESOPHAGOGASTRODUODENOSCOPY     ESOPHAGOGASTRODUODENOSCOPY (EGD) WITH PROPOFOL  N/A 09/13/2020   Procedure: ESOPHAGOGASTRODUODENOSCOPY (EGD) WITH PROPOFOL ;  Surgeon: Maryruth Ole DASEN, MD;  Location: [facility] ENDOSCOPY;  Service: Endoscopy;  Laterality: N/A;   LEFT HEART CATH AND CORONARY ANGIOGRAPHY N/A 04/17/2022   Procedure: LEFT HEART CATH AND CORONARY ANGIOGRAPHY;  Surgeon: Court Dorn PARAS, MD;  Location: [facility] INVASIVE CV LAB;  Service:  Cardiovascular;  Laterality: N/A;   skin cancer removal     TONSILLECTOMY     TUBAL LIGATION      Prior to Admission medications   Medication Sig Start Date End Date Taking? Authorizing Provider  acetaminophen  (TYLENOL ) 500 MG tablet Take 500 mg by mouth every 6 (six) hours as needed for mild pain.   Yes [provider]  amiodarone  (PACERONE ) 200 MG tablet Take 200 mg by mouth in the morning.   Yes [provider]  apixaban  (ELIQUIS ) 5 MG TABS tablet Take 1 tablet (5 mg total) by mouth 2 (two) times daily. 02/02/23  Yes Regalado, Belkys A, MD  estradiol  (ESTRACE ) 0.5 MG tablet Take 0.5 mg by mouth at bedtime.   Yes [provider]  levothyroxine  (SYNTHROID ) 50 MCG tablet Take 50 mcg by mouth daily before breakfast.   Yes [provider]  venlafaxine  XR (EFFEXOR -XR) 150 MG 24 hr capsule Take 150 mg by mouth daily. 01/20/22  Yes [provider]    Allergies as of 05/19/2024 - Review Complete 10/16/2023  Allergen Reaction Noted   Hydromorphone  Other (See Comments) 01/24/2023   Desyrel [trazodone] Rash 08/31/2017   Sulfa antibiotics Rash 08/31/2017    Family History  Problem Relation Age of Onset   Diabetes Mellitus II Mother    Atrial fibrillation Father    Breast cancer Neg Hx     Social History   Socioeconomic  History   Marital status: Widowed    Spouse name: Not on file   Number of children: Not on file   Years of education: Not on file   Highest education level: Not on file  Occupational History   Not on file  Tobacco Use   Smoking status: Never   Smokeless tobacco: Never  Vaping Use   Vaping status: Never Used  Substance and Sexual Activity   Alcohol use: Yes    Alcohol/week: 1.0 - 2.0 standard drink of alcohol    Types: 1 - 2 Glasses of wine per week    Comment: a couple of glasses of wine per day   Drug use: No   Sexual activity: Not on file  Other Topics Concern   Not on file  Social History Narrative   Not on file    Social Drivers of Health   Financial Resource Strain: Low Risk  (01/17/2024)   Received from [facility]   Overall Financial Resource Strain (CARDIA)    Difficulty of Paying Living Expenses: Not hard at all  Food Insecurity: No Food Insecurity (01/17/2024)   Received from [facility]   Hunger Vital Sign    Within the past 12 months, you worried that your food would run out before you got the money to buy more.: Never true    Within the past 12 months, the food you bought just didn't last and you didn't have money to get more.: Never true  Transportation Needs: No Transportation Needs (01/17/2024)   Received from [facility] - Transportation    In the past 12 months, has lack of transportation kept you from medical appointments or from getting medications?: No    Lack of Transportation (Non-Medical): No  Physical Activity: Not on file  Stress: Not on file  Social Connections: Not on file  Intimate Partner Violence: Not At Risk (01/21/2023)   Humiliation, Afraid, Rape, and Kick questionnaire    Fear of Current or Ex-Partner: No    Emotionally Abused: No    Physically Abused: No    Sexually Abused: No    Review of Systems: See HPI, otherwise negative ROS  Physical Exam: BP (!) 183/86   Pulse 76   Temp 97.7 F (36.5 C) (Temporal)   Resp 18   Ht 5' 6 (1.676 m)   Wt 89.8 kg   LMP  (LMP Unknown)   SpO2 97%   BMI 31.96 kg/m  General:   Alert, cooperative. Head:  Normocephalic and atraumatic. Respiratory:  Normal work of breathing. Cardiovascular:  NAD  Impression/Plan: Caroline Zamora is here for cataract surgery.  Risks, benefits, limitations, and alternatives regarding cataract surgery have been reviewed with the patient.  Questions have been answered.  All parties agreeable.   Elsie Carmine, MD  07/15/2024, 10:23 AM

## 2024-07-15 NOTE — Anesthesia Postprocedure Evaluation (Signed)
 Anesthesia Post Note  Patient: Caroline Zamora  Procedure(s) Performed: PHACOEMULSIFICATION, CATARACT, WITH IOL INSERTION 5.85, 00:30.9 (Right)  Patient location during evaluation: PACU Anesthesia Type: MAC Level of consciousness: awake and alert Pain management: pain level controlled Vital Signs Assessment: post-procedure vital signs reviewed and stable Respiratory status: spontaneous breathing, nonlabored ventilation, respiratory function stable and patient connected to nasal cannula oxygen Cardiovascular status: stable and blood pressure returned to baseline Postop Assessment: no apparent nausea or vomiting Anesthetic complications: no   No notable events documented.   Last Vitals:  Vitals:   07/15/24 1056 07/15/24 1101  BP: 113/89 (!) 141/72  Pulse: 69 69  Resp: 20 17  Temp: 36.7 C (!) 36.4 C  SpO2: 98% 95%    Last Pain:  Vitals:   07/15/24 1101  TempSrc:   PainSc: 0-No pain                 Aloysious Vangieson C [name]

## 2024-07-15 NOTE — Transfer of Care (Signed)
 Immediate Anesthesia Transfer of Care Note  Patient: Caroline Zamora  Procedure(s) Performed: PHACOEMULSIFICATION, CATARACT, WITH IOL INSERTION (Right)  Patient Location: PACU  Anesthesia Type: MAC  Level of Consciousness: awake, alert  and patient cooperative  Airway and Oxygen Therapy: Patient Spontanous Breathing and Patient connected to supplemental oxygen  Post-op Assessment: Post-op Vital signs reviewed, Patient's Cardiovascular Status Stable, Respiratory Function Stable, Patent Airway and No signs of Nausea or vomiting  Post-op Vital Signs: Reviewed and stable  Complications: No notable events documented.

## 2024-07-15 NOTE — Op Note (Signed)
 PREOPERATIVE DIAGNOSIS:  Nuclear sclerotic cataract of the right eye.   POSTOPERATIVE DIAGNOSIS:  Right Eye Cataract   OPERATIVE PROCEDURE:ORPROCALL@   SURGEON:  Elsie Carmine, MD.   ANESTHESIA:  Anesthesiologist: Ola Donny BROCKS, MD CRNA: Dave Maus, CRNA  1.      Managed anesthesia care. 2.      0.16ml of Shugarcaine was instilled in the eye following the paracentesis.   COMPLICATIONS:  None.   TECHNIQUE:   Stop and chop   DESCRIPTION OF PROCEDURE:  The patient was examined and consented in the preoperative holding area where the aforementioned topical anesthesia was applied to the right eye and then brought back to the Operating Room where the right eye was prepped and draped in the usual sterile ophthalmic fashion and a lid speculum was placed. A paracentesis was created with the side port blade and the anterior chamber was filled with viscoelastic. A near clear corneal incision was performed with the steel keratome. A continuous curvilinear capsulorrhexis was performed with a cystotome followed by the capsulorrhexis forceps. Hydrodissection and hydrodelineation were carried out with BSS on a blunt cannula. The lens was removed in a stop and chop  technique and the remaining cortical material was removed with the irrigation-aspiration handpiece. The capsular bag was inflated with viscoelastic and the Technis [id number]  lens was placed in the capsular bag without complication. The remaining viscoelastic was removed from the eye with the irrigation-aspiration handpiece. The wounds were hydrated. The anterior chamber was flushed with BSS and the eye was inflated to physiologic pressure. 0.53ml of Vigamox  was placed in the anterior chamber. The wounds were found to be water  tight. The eye was dressed with Combigan . The patient was given protective glasses to wear throughout the day and a shield with which to sleep tonight. The patient was also given drops with which to begin a drop regimen  today and will follow-up with me in one day. Implant Name Type Inv. Item Serial No. Manufacturer Lot No. LRB No. Used Action  LENS IOL TECNIS EYHANCE 20.0 - [id number] Intraocular Lens LENS IOL TECNIS EYHANCE 20.0 [id number] [employer]  Right 1 Implanted   Procedure(s): PHACOEMULSIFICATION, CATARACT, WITH IOL INSERTION 5.85, 00:30.9 (Right)  Electronically signed: Elsie Carmine 07/15/2024 10:54 AM

## 2024-07-21 ENCOUNTER — Other Ambulatory Visit: Payer: Self-pay | Admitting: Internal Medicine

## 2024-07-21 DIAGNOSIS — Z1231 Encounter for screening mammogram for malignant neoplasm of breast: Secondary | ICD-10-CM

## 2024-07-29 ENCOUNTER — Ambulatory Visit: Attending: Cardiology | Admitting: Cardiology

## 2024-07-29 ENCOUNTER — Encounter: Payer: Self-pay | Admitting: Cardiology

## 2024-07-29 VITALS — BP 136/82 | HR 81 | Ht 66.0 in | Wt 201.0 lb

## 2024-07-29 DIAGNOSIS — Z79899 Other long term (current) drug therapy: Secondary | ICD-10-CM

## 2024-07-29 DIAGNOSIS — I480 Paroxysmal atrial fibrillation: Secondary | ICD-10-CM | POA: Diagnosis not present

## 2024-07-29 DIAGNOSIS — D6869 Other thrombophilia: Secondary | ICD-10-CM | POA: Diagnosis not present

## 2024-07-29 DIAGNOSIS — I5032 Chronic diastolic (congestive) heart failure: Secondary | ICD-10-CM | POA: Diagnosis not present

## 2024-07-29 DIAGNOSIS — Z5181 Encounter for therapeutic drug level monitoring: Secondary | ICD-10-CM

## 2024-07-29 DIAGNOSIS — R55 Syncope and collapse: Secondary | ICD-10-CM

## 2024-07-29 DIAGNOSIS — I951 Orthostatic hypotension: Secondary | ICD-10-CM

## 2024-07-29 NOTE — Progress Notes (Signed)
 Electrophysiology Clinic Note    Date:  07/29/2024  Patient ID:  Caroline Zamora, Caroline Zamora Jun 24, 1941, MRN [id number] PCP:  Lenon Layman ORN, MD  Cardiologist:  Dr. Fernande     Discussed the use of AI scribe software for clinical note transcription with the patient, who gave verbal consent to proceed.   Patient Profile    Chief Complaint: AFib, amiodarone  follow-up  History of Present Illness: Caroline Zamora is a 83 y.o. female with PMH notable for PVCs, parox AFib, orthostatic syncope, Colon Ca, HTN, HLD, T2DM; seen today for routine electrophysiology followup.   She last saw Dr. Fernande 07/2023 where she had recently been hospitalized for syncope. During visit, she was doing well with slight volume overload, planned for PRN lasix  x 2 days. Letting BP ride higher d/t orthostatic and syncope.   On follow-up today, she is doing very well. She has intermittent swelling to lower extremities, but it is not significant, mostly just sock lines around ankles. She goes to water  aerobics 2-3 times per week for ~1.5h. No chest pain, chest pressure, palpitations. No further syncope. She drinks copious amount of water .  She continues to take eliquis  BID, has some minor bruising to forearms but no bleeding through GI/GU tracts.      Arrhythmia/Device History Amiodarone     ROS:  Please see the history of present illness. All other systems are reviewed and otherwise negative.    Physical Exam    VS:  BP 136/82 (BP Location: Left Arm, Patient Position: Sitting, Cuff Size: Normal)   Pulse 81   Ht 5' 6 (1.676 m)   Wt 201 lb (91.2 kg)   LMP  (LMP Unknown)   SpO2 97%   BMI 32.44 kg/m  BMI: Body mass index is 32.44 kg/m.  Orthostatic VS for the past 24 hrs (Last 3 readings):  BP- Lying Pulse- Lying BP- Sitting Pulse- Sitting BP- Standing at 0 minutes Pulse- Standing at 0 minutes BP- Standing at 3 minutes Pulse- Standing at 3 minutes  07/29/24 1430 150/79 82 147/84 82 146/79 84 147/84  80     Wt Readings from Last 3 Encounters:  07/29/24 201 lb (91.2 kg)  07/15/24 198 lb (89.8 kg)  10/16/23 180 lb (81.6 kg)     GEN- The patient is well appearing, alert and oriented x 3 today.   Lungs- Clear to ausculation bilaterally, normal work of breathing.  Heart- Regular rate and rhythm, no murmurs, rubs or gallops Extremities- Trace peripheral edema, warm, dry   Studies Reviewed   Previous EP, cardiology notes.    EKG is ordered. Personal review of EKG from today shows:    EKG Interpretation Date/Time:  Tuesday July 29 2024 14:26:23 EDT Ventricular Rate:  81 PR Interval:  144 QRS Duration:  128 QT Interval:  426 QTC Calculation: 494 R Axis:   54  Text Interpretation: Normal sinus rhythm Right bundle branch block T wave abnormality, consider lateral ischemia Confirmed by Waynetta Metheny [phone]) on 07/29/2024 2:57:45 PM    TTE, 12/12/2022 1. Left ventricular ejection fraction, by estimation, is 55 to 60%. The left ventricle has normal function. The left ventricle has no regional wall motion abnormalities. There is mild left ventricular hypertrophy. Left ventricular diastolic parameters are consistent with Grade I diastolic dysfunction (impaired relaxation).   2. Right ventricular systolic function is normal. The right ventricular size is normal.   3. The mitral valve is normal in structure. Trivial mitral valve regurgitation. No evidence of mitral  stenosis.   4. The aortic valve is tricuspid. Aortic valve regurgitation is not visualized. No aortic stenosis is present.   Long term monitor, 05/19/2022 Patch Wear Time:  14 days and 22 hours (2023-05-17T12:01:29-0400 to 2023-06-06T17:02:06-399)   Monitor 1 Patient had a min HR of 62 bpm, max HR of 132 bpm, and avg HR of 76 bpm. Predominant underlying rhythm was Sinus Rhythm. Bundle Branch Block/IVCD was present. Atrial Fibrillation occurred (2% burden), ranging from 90-132 bpm (avg of 108 bpm), the longest  lasting 31 mins  8 secs with an avg rate of 108 bpm. Isolated SVEs were rare (<1.0%), SVE Couplets were rare (<1.0%), and SVE Triplets were rare (<1.0%). Isolated VEs were occasional (4.5%, 2506), VE Couplets were rare (<1.0%, 66), and no VE Triplets  were present. Ventricular Bigeminy was present.   Monitor 2 Patient had a min HR of 56 bpm, max HR of 200 bpm, and avg HR of 82 bpm. Predominant underlying rhythm was Sinus Rhythm. Bundle Branch Block/IVCD was present. 10 Supraventricular Tachycardia runs occurred, the run with the fastest interval lasting 11.3  secs with a max rate of 174 bpm (avg 145 bpm); the run with the fastest interval was also the longest. Atrial Fibrillation/Flutter occurred (21% burden), ranging from 73-200 bpm (avg of 112 bpm), the longest lasting 1 day 7 hours with an avg rate of 110  bpm. Isolated SVEs were rare (<1.0%), SVE Couplets were rare (<1.0%), and SVE Triplets were rare (<1.0%). Isolated VEs were rare (<1.0%), VE Couplets were rare (<1.0%), and no VE Triplets were present. Ventricular Bigeminy was present. Previoulsy  notified -- this is a replacement device: MD notification criteria for First Documentation of Atrial Fibrillation/Atrial Flutter met - Notified Dr Gladis on 21 Apr 2022 at 2:19 AM CDT [facility]).  Cardiac MRI, 04/18/2022 1.  Normal LV size with mild systolic dysfunction (EF 42%)  2.  Normal RV size and systolic function (EF 49%)  3.  No late gadolinium enhancement to suggest myocardial scar   Assessment and Plan     #) parox AFib, aflutter #) amiodarone  monitoring Maintaining sinus rhythm Continue 200mg  amiodarone  daily Update LFT, thyroid labs today  #) Hypercoag d/t afib CHA2DS2-VASc Score = at least 5 [CHF History: 1, HTN History: 1, Diabetes History: 0, Stroke History: 0, Vascular Disease History: 0, Age Score: 2, Gender Score: 1].  Therefore, the patient's annual risk of stroke is 7.2 %.    Stroke ppx - 5mg  eliquis  BID, appropriately dosed No bleeding  concerns Update CBC, BMP to confirm correct eliquis  dose  #) HFimpEF Appears warm and euvolemic on exam Good exercise tolerance  #) orthostatic hypotension #) syncope Orthostatics negative today in office No recent syncope episodes Continue PO fluid intake      Current medicines are reviewed at length with the patient today.   The patient does not have concerns regarding her medicines.  The following changes were made today:  none  Labs/ tests ordered today include:  Orders Placed This Encounter  Procedures   Comprehensive metabolic panel with GFR   Magnesium    TSH   T4, free   CBC   EKG 12-Lead     Disposition: Follow up with EP Team PRN .  Establish care with gen cards for ongoing cardiology care, in ~6 months   Signed, Caroline Sabino, NP  07/29/24  3:27 PM  Electrophysiology [facility]

## 2024-07-29 NOTE — Patient Instructions (Signed)
 Medication Instructions:  Your physician recommends that you continue on your current medications as directed. Please refer to the Current Medication list given to you today.    *If you need a refill on your cardiac medications before your next appointment, please call your pharmacy*  Lab Work: Your provider would like for you to have following labs drawn today CMP, mg, TSH, T4, CBC.     Testing/Procedures: No test ordered today   Follow-Up: At [facility], you and your health needs are our priority.  As part of our continuing mission to provide you with exceptional heart care, our providers are all part of one team.  This team includes your primary Cardiologist (physician) and Advanced Practice Providers or APPs (Physician Assistants and Nurse Practitioners) who all work together to provide you with the care you need, when you need it.  Your next appointment:   6 month(s)  Provider:   Dr. Argentina    Your physician recommends that you follow up as needed with Suzann Riddle, NP

## 2024-07-30 ENCOUNTER — Ambulatory Visit: Payer: Self-pay | Admitting: Cardiology

## 2024-07-30 LAB — COMPREHENSIVE METABOLIC PANEL WITH GFR
ALT: 17 IU/L (ref 0–32)
AST: 21 IU/L (ref 0–40)
Albumin: 4 g/dL (ref 3.8–4.8)
Alkaline Phosphatase: 80 IU/L (ref 44–121)
BUN/Creatinine Ratio: 15 (ref 12–28)
BUN: 20 mg/dL (ref 8–27)
Bilirubin Total: 0.2 mg/dL (ref 0.0–1.2)
CO2: 20 mmol/L (ref 20–29)
Calcium: 8.4 mg/dL — ABNORMAL LOW (ref 8.7–10.3)
Chloride: 96 mmol/L (ref 96–106)
Creatinine, Ser: 1.3 mg/dL — ABNORMAL HIGH (ref 0.57–1.00)
Globulin, Total: 2.7 g/dL (ref 1.5–4.5)
Glucose: 82 mg/dL (ref 70–99)
Potassium: 4 mmol/L (ref 3.5–5.2)
Sodium: 133 mmol/L — ABNORMAL LOW (ref 134–144)
Total Protein: 6.7 g/dL (ref 6.0–8.5)
eGFR: 42 mL/min/1.73 — ABNORMAL LOW (ref >59)

## 2024-07-30 LAB — CBC
Hematocrit: 44.9 % (ref 34.0–46.6)
Hemoglobin: 14.5 g/dL (ref 11.1–15.9)
MCH: 30.6 pg (ref 26.6–33.0)
MCHC: 32.3 g/dL (ref 31.5–35.7)
MCV: 95 fL (ref 79–97)
Platelets: 334 x10E3/uL (ref 150–450)
RBC: 4.74 x10E6/uL (ref 3.77–5.28)
RDW: 14.6 % (ref 11.7–15.4)
WBC: 8.5 x10E3/uL (ref 3.4–10.8)

## 2024-07-30 LAB — MAGNESIUM: Magnesium: 1.9 mg/dL (ref 1.6–2.3)

## 2024-07-30 LAB — T4, FREE: Free T4: 1.63 ng/dL (ref 0.82–1.77)

## 2024-07-30 LAB — TSH: TSH: 2.87 u[IU]/mL (ref 0.450–4.500)

## 2024-07-31 NOTE — Telephone Encounter (Signed)
 Left voicemail message to call back

## 2024-07-31 NOTE — Telephone Encounter (Signed)
Reviewed results and recommendations with patient. She verbalized understanding with no further questions at this time.  ?

## 2024-08-07 ENCOUNTER — Encounter

## 2024-09-16 ENCOUNTER — Ambulatory Visit
Admission: RE | Admit: 2024-09-16 | Discharge: 2024-09-16 | Disposition: A | Discharge status: Home / Self Care | Source: Ambulatory Visit | Attending: Internal Medicine | Admitting: Internal Medicine

## 2024-09-16 DIAGNOSIS — Z1231 Encounter for screening mammogram for malignant neoplasm of breast: Secondary | ICD-10-CM | POA: Insufficient documentation
